# Patient Record
Sex: Female | Born: 1935
Health system: Southern US, Community
[De-identification: ages and names within clinical notes are randomized; demographics above are authoritative.]

## PROBLEM LIST (undated history)

## (undated) DIAGNOSIS — J189 Pneumonia, unspecified organism: Secondary | ICD-10-CM

## (undated) DIAGNOSIS — R52 Pain, unspecified: Secondary | ICD-10-CM

## (undated) DIAGNOSIS — F32A Depression, unspecified: Secondary | ICD-10-CM

## (undated) DIAGNOSIS — R112 Nausea with vomiting, unspecified: Secondary | ICD-10-CM

## (undated) DIAGNOSIS — K219 Gastro-esophageal reflux disease without esophagitis: Secondary | ICD-10-CM

## (undated) DIAGNOSIS — R51 Headache: Secondary | ICD-10-CM

## (undated) DIAGNOSIS — T4145XA Adverse effect of unspecified anesthetic, initial encounter: Secondary | ICD-10-CM

## (undated) DIAGNOSIS — I10 Essential (primary) hypertension: Secondary | ICD-10-CM

## (undated) DIAGNOSIS — R251 Tremor, unspecified: Secondary | ICD-10-CM

## (undated) DIAGNOSIS — H9190 Unspecified hearing loss, unspecified ear: Secondary | ICD-10-CM

## (undated) DIAGNOSIS — C8590 Non-Hodgkin lymphoma, unspecified, unspecified site: Secondary | ICD-10-CM

## (undated) DIAGNOSIS — R0600 Dyspnea, unspecified: Secondary | ICD-10-CM

## (undated) DIAGNOSIS — C50919 Malignant neoplasm of unspecified site of unspecified female breast: Secondary | ICD-10-CM

## (undated) DIAGNOSIS — T8859XA Other complications of anesthesia, initial encounter: Secondary | ICD-10-CM

## (undated) DIAGNOSIS — Z9889 Other specified postprocedural states: Secondary | ICD-10-CM

## (undated) DIAGNOSIS — F419 Anxiety disorder, unspecified: Secondary | ICD-10-CM

## (undated) DIAGNOSIS — F329 Major depressive disorder, single episode, unspecified: Secondary | ICD-10-CM

## (undated) HISTORY — PX: APPENDECTOMY: SHX54

## (undated) HISTORY — PX: BACK SURGERY: SHX140

## (undated) HISTORY — PX: BREAST SURGERY: SHX581

## (undated) HISTORY — PX: TUBAL LIGATION: SHX77

---

## 1990-10-12 HISTORY — PX: MASTECTOMY: SHX3

## 1998-02-25 ENCOUNTER — Other Ambulatory Visit: Admission: RE | Admit: 1998-02-25 | Discharge: 1998-02-25 | Payer: Self-pay | Admitting: Obstetrics and Gynecology

## 1999-03-21 ENCOUNTER — Other Ambulatory Visit: Admission: RE | Admit: 1999-03-21 | Discharge: 1999-03-21 | Payer: Self-pay | Admitting: Obstetrics and Gynecology

## 2000-03-18 ENCOUNTER — Encounter: Payer: Self-pay | Admitting: Obstetrics and Gynecology

## 2000-03-18 ENCOUNTER — Encounter: Admission: RE | Admit: 2000-03-18 | Discharge: 2000-03-18 | Payer: Self-pay | Admitting: Obstetrics and Gynecology

## 2000-03-23 ENCOUNTER — Other Ambulatory Visit: Admission: RE | Admit: 2000-03-23 | Discharge: 2000-03-23 | Payer: Self-pay | Admitting: Obstetrics and Gynecology

## 2001-03-21 ENCOUNTER — Encounter (HOSPITAL_BASED_OUTPATIENT_CLINIC_OR_DEPARTMENT_OTHER): Payer: Self-pay | Admitting: General Surgery

## 2001-03-21 ENCOUNTER — Encounter: Admission: RE | Admit: 2001-03-21 | Discharge: 2001-03-21 | Payer: Self-pay | Admitting: General Surgery

## 2001-03-30 ENCOUNTER — Other Ambulatory Visit: Admission: RE | Admit: 2001-03-30 | Discharge: 2001-03-30 | Payer: Self-pay | Admitting: Obstetrics and Gynecology

## 2002-03-22 ENCOUNTER — Encounter: Admission: RE | Admit: 2002-03-22 | Discharge: 2002-03-22 | Payer: Self-pay | Admitting: Obstetrics and Gynecology

## 2002-03-22 ENCOUNTER — Encounter: Payer: Self-pay | Admitting: Obstetrics and Gynecology

## 2002-03-28 ENCOUNTER — Other Ambulatory Visit: Admission: RE | Admit: 2002-03-28 | Discharge: 2002-03-28 | Payer: Self-pay | Admitting: Obstetrics and Gynecology

## 2003-04-17 ENCOUNTER — Encounter: Payer: Self-pay | Admitting: Obstetrics and Gynecology

## 2003-04-17 ENCOUNTER — Encounter: Admission: RE | Admit: 2003-04-17 | Discharge: 2003-04-17 | Payer: Self-pay | Admitting: Obstetrics and Gynecology

## 2004-04-25 ENCOUNTER — Encounter: Admission: RE | Admit: 2004-04-25 | Discharge: 2004-04-25 | Payer: Self-pay | Admitting: Obstetrics and Gynecology

## 2004-08-07 ENCOUNTER — Ambulatory Visit (HOSPITAL_COMMUNITY): Admission: RE | Admit: 2004-08-07 | Discharge: 2004-08-07 | Payer: Self-pay | Admitting: Gastroenterology

## 2004-12-26 ENCOUNTER — Emergency Department: Payer: Self-pay | Admitting: Unknown Physician Specialty

## 2005-01-01 ENCOUNTER — Ambulatory Visit: Payer: Self-pay

## 2005-01-05 ENCOUNTER — Inpatient Hospital Stay: Payer: Self-pay | Admitting: Unknown Physician Specialty

## 2005-05-05 ENCOUNTER — Encounter: Admission: RE | Admit: 2005-05-05 | Discharge: 2005-05-05 | Payer: Self-pay | Admitting: Obstetrics and Gynecology

## 2005-05-13 ENCOUNTER — Other Ambulatory Visit: Admission: RE | Admit: 2005-05-13 | Discharge: 2005-05-13 | Payer: Self-pay | Admitting: Obstetrics and Gynecology

## 2008-06-01 ENCOUNTER — Emergency Department: Payer: Self-pay | Admitting: Emergency Medicine

## 2008-06-03 ENCOUNTER — Emergency Department: Payer: Self-pay | Admitting: Emergency Medicine

## 2011-04-14 ENCOUNTER — Observation Stay: Payer: Self-pay | Admitting: Internal Medicine

## 2011-12-03 ENCOUNTER — Ambulatory Visit: Payer: Self-pay | Admitting: Family Medicine

## 2012-01-13 ENCOUNTER — Emergency Department: Payer: Self-pay | Admitting: *Deleted

## 2012-01-13 ENCOUNTER — Encounter (HOSPITAL_COMMUNITY): Payer: Self-pay | Admitting: *Deleted

## 2012-01-13 ENCOUNTER — Inpatient Hospital Stay (HOSPITAL_COMMUNITY)
Admission: AD | Admit: 2012-01-13 | Discharge: 2012-01-19 | DRG: 552 | Disposition: A | Discharge status: Skilled Nursing Facility | Payer: Medicare Other | Source: Other Acute Inpatient Hospital | Attending: Internal Medicine | Admitting: Internal Medicine

## 2012-01-13 DIAGNOSIS — S22009A Unspecified fracture of unspecified thoracic vertebra, initial encounter for closed fracture: Principal | ICD-10-CM | POA: Diagnosis present

## 2012-01-13 DIAGNOSIS — K5900 Constipation, unspecified: Secondary | ICD-10-CM | POA: Diagnosis present

## 2012-01-13 DIAGNOSIS — Z888 Allergy status to other drugs, medicaments and biological substances status: Secondary | ICD-10-CM

## 2012-01-13 DIAGNOSIS — Z853 Personal history of malignant neoplasm of breast: Secondary | ICD-10-CM

## 2012-01-13 DIAGNOSIS — M545 Low back pain, unspecified: Secondary | ICD-10-CM | POA: Diagnosis present

## 2012-01-13 DIAGNOSIS — F3289 Other specified depressive episodes: Secondary | ICD-10-CM | POA: Diagnosis present

## 2012-01-13 DIAGNOSIS — F329 Major depressive disorder, single episode, unspecified: Secondary | ICD-10-CM | POA: Diagnosis present

## 2012-01-13 DIAGNOSIS — W19XXXA Unspecified fall, initial encounter: Secondary | ICD-10-CM | POA: Diagnosis present

## 2012-01-13 DIAGNOSIS — E785 Hyperlipidemia, unspecified: Secondary | ICD-10-CM | POA: Diagnosis present

## 2012-01-13 DIAGNOSIS — S22080A Wedge compression fracture of T11-T12 vertebra, initial encounter for closed fracture: Secondary | ICD-10-CM | POA: Diagnosis present

## 2012-01-13 DIAGNOSIS — F411 Generalized anxiety disorder: Secondary | ICD-10-CM | POA: Diagnosis present

## 2012-01-13 DIAGNOSIS — Z79899 Other long term (current) drug therapy: Secondary | ICD-10-CM

## 2012-01-13 DIAGNOSIS — I10 Essential (primary) hypertension: Secondary | ICD-10-CM | POA: Diagnosis present

## 2012-01-13 DIAGNOSIS — K219 Gastro-esophageal reflux disease without esophagitis: Secondary | ICD-10-CM | POA: Diagnosis present

## 2012-01-13 HISTORY — DX: Anxiety disorder, unspecified: F41.9

## 2012-01-13 HISTORY — DX: Pneumonia, unspecified organism: J18.9

## 2012-01-13 HISTORY — DX: Depression, unspecified: F32.A

## 2012-01-13 HISTORY — DX: Headache: R51

## 2012-01-13 HISTORY — DX: Major depressive disorder, single episode, unspecified: F32.9

## 2012-01-13 HISTORY — DX: Gastro-esophageal reflux disease without esophagitis: K21.9

## 2012-01-13 HISTORY — DX: Essential (primary) hypertension: I10

## 2012-01-13 LAB — COMPREHENSIVE METABOLIC PANEL
Albumin: 3.6 g/dL (ref 3.5–5.2)
Alkaline Phosphatase: 77 U/L (ref 39–117)
BUN: 19 mg/dL (ref 6–23)
Calcium: 9 mg/dL (ref 8.4–10.5)
GFR calc Af Amer: >90 mL/min (ref >90)
Potassium: 4.1 mEq/L (ref 3.5–5.1)
Sodium: 138 mEq/L (ref 135–145)
Total Protein: 6.2 g/dL (ref 6.0–8.3)

## 2012-01-13 LAB — PROTIME-INR
INR: 1.09 (ref 0.00–1.49)
Prothrombin Time: 14.3 seconds (ref 11.6–15.2)

## 2012-01-13 MED ORDER — HYDROCODONE-ACETAMINOPHEN 5-325 MG PO TABS
1.0000 | ORAL_TABLET | Freq: Four times a day (QID) | ORAL | Status: DC | PRN
  Administered 2012-01-14 – 2012-01-16 (×7): 1 via ORAL
  Filled 2012-01-13 (×7): qty 1

## 2012-01-13 MED ORDER — ALPRAZOLAM 0.25 MG PO TABS
0.2500 mg | ORAL_TABLET | Freq: Three times a day (TID) | ORAL | Status: DC | PRN
  Administered 2012-01-14 – 2012-01-19 (×5): 0.25 mg via ORAL
  Filled 2012-01-13 (×5): qty 1

## 2012-01-13 MED ORDER — HYDROCODONE-ACETAMINOPHEN 5-500 MG PO TABS: 1.0000 | ORAL_TABLET | Freq: Four times a day (QID) | ORAL | Status: DC | PRN

## 2012-01-13 MED ORDER — BISOPROLOL FUMARATE 5 MG PO TABS
2.5000 mg | ORAL_TABLET | Freq: Every day | ORAL | Status: DC
  Administered 2012-01-14 – 2012-01-19 (×6): 2.5 mg via ORAL
  Filled 2012-01-13 (×6): qty 0.5

## 2012-01-13 MED ORDER — SODIUM CHLORIDE 0.9 % IV SOLN
INTRAVENOUS | Status: DC
  Administered 2012-01-13 – 2012-01-16 (×3): via INTRAVENOUS

## 2012-01-13 MED ORDER — FAMOTIDINE 20 MG PO TABS
20.0000 mg | ORAL_TABLET | Freq: Every day | ORAL | Status: DC
  Administered 2012-01-14 – 2012-01-18 (×5): 20 mg via ORAL
  Filled 2012-01-13 (×6): qty 1

## 2012-01-13 MED ORDER — HYDROMORPHONE HCL PF 1 MG/ML IJ SOLN
0.5000 mg | INTRAMUSCULAR | Status: DC | PRN
  Administered 2012-01-13 – 2012-01-14 (×2): 0.5 mg via INTRAVENOUS
  Administered 2012-01-14: 08:00:00 via INTRAVENOUS
  Administered 2012-01-14: 0.5 mg via INTRAVENOUS
  Filled 2012-01-13 (×4): qty 1

## 2012-01-13 MED ORDER — MECLIZINE HCL 25 MG PO TABS
25.0000 mg | ORAL_TABLET | Freq: Three times a day (TID) | ORAL | Status: DC | PRN
  Filled 2012-01-13: qty 1

## 2012-01-13 MED ORDER — ACETAMINOPHEN 650 MG RE SUPP: 650.0000 mg | Freq: Four times a day (QID) | RECTAL | Status: DC | PRN

## 2012-01-13 MED ORDER — ONDANSETRON HCL 4 MG/2ML IJ SOLN: 4.0000 mg | Freq: Four times a day (QID) | INTRAMUSCULAR | Status: DC | PRN

## 2012-01-13 MED ORDER — ACETAMINOPHEN 325 MG PO TABS
650.0000 mg | ORAL_TABLET | Freq: Four times a day (QID) | ORAL | Status: DC | PRN
  Administered 2012-01-15 – 2012-01-19 (×6): 650 mg via ORAL
  Filled 2012-01-13 (×7): qty 2

## 2012-01-13 MED ORDER — ONDANSETRON HCL 4 MG PO TABS: 4.0000 mg | ORAL_TABLET | Freq: Four times a day (QID) | ORAL | Status: DC | PRN

## 2012-01-13 MED ORDER — SERTRALINE HCL 100 MG PO TABS
100.0000 mg | ORAL_TABLET | Freq: Every day | ORAL | Status: DC
  Administered 2012-01-14 – 2012-01-19 (×6): 100 mg via ORAL
  Filled 2012-01-13 (×6): qty 1

## 2012-01-13 MED ORDER — TRAZODONE HCL 50 MG PO TABS
50.0000 mg | ORAL_TABLET | Freq: Every day | ORAL | Status: DC
  Administered 2012-01-13 – 2012-01-18 (×6): 50 mg via ORAL
  Filled 2012-01-13 (×7): qty 1

## 2012-01-13 MED ORDER — SIMVASTATIN 40 MG PO TABS
40.0000 mg | ORAL_TABLET | Freq: Every day | ORAL | Status: DC
  Administered 2012-01-14 – 2012-01-18 (×5): 40 mg via ORAL
  Filled 2012-01-13 (×6): qty 1

## 2012-01-13 NOTE — H&P (Addendum)
Lindsey Barnes is an 78 y.o. female.   PCP - Dr.Bronstein.In [place]. Ortho - Dr.Califf.In [employer]. Chief Complaint: Fall with low back pain. HPI: 78 year-old female with history of hypertension, hyperlipidemia, cancer breast status post right-sided mastectomy 20 years ago, history of lumbar fracture status post kyphoplasty had a fall at her home while trying to [facility]. She is and fell on her back but did not lose consciousness and did not hit her head. Patient came to the ER at [facility] and CAT scan of the spine done showed T12 compression fracture. Patient usually follows with Dr. Gerrit Heck, orthopedic surgeon, who is out of town so for further management patient has been transferred to [facility]. Patient denies any incontinence of urine or bowel. Patient's pain is controlled with Dilaudid. Patient denies any chest pain, shortness of breath, any weakness or lower extremity, nausea vomiting. Patient's pain is on the mid back radiating to her front and also complaining of mild epigastric tenderness.  Past Medical History  Diagnosis Date  . Hypertension   . Pneumonia   . GERD (gastroesophageal reflux disease)   . Headache   . Cancer   . Anxiety   . Depression     Past Surgical History  Procedure Date  . Mastectomy   . Breast surgery   . Appendectomy   . Back surgery   . Tubal ligation     History reviewed. No pertinent family history. Social History:  reports that she has never smoked. She has never used smokeless tobacco. She reports that she uses illicit drugs (Hydrocodone and Benzodiazepines). She reports that she does not drink alcohol.  Allergies:  Allergies  Allergen Reactions  . Amoxicillin Swelling    Blisters.   . Aspirin Other (See Comments)    Stomach ulcers.  . Benadryl (Altaryl) Other (See Comments)    Hyperactive.    Medications Prior to Admission  Medication Dose Route Frequency Provider Last Rate Last Dose  . 0.9 %  sodium  chloride infusion   Intravenous Continuous Eduard Clos, MD      . acetaminophen (TYLENOL) tablet 650 mg  650 mg Oral Q6H PRN Eduard Clos, MD       Or  . acetaminophen (TYLENOL) suppository 650 mg  650 mg Rectal Q6H PRN Eduard Clos, MD      . ALPRAZolam Prudy Feeler) tablet 0.25 mg  0.25 mg Oral TID PRN Eduard Clos, MD      . bisoprolol (ZEBETA) tablet 2.5 mg  2.5 mg Oral Daily Eduard Clos, MD      . famotidine (PEPCID) tablet 20 mg  20 mg Oral Daily Eduard Clos, MD      . HYDROcodone-acetaminophen (VICODIN) 5-500 MG per tablet 1 tablet  1 tablet Oral Q6H PRN Eduard Clos, MD      . HYDROmorphone (DILAUDID) injection 0.5 mg  0.5 mg Intravenous Q4H PRN Eduard Clos, MD      . meclizine (ANTIVERT) tablet 25 mg  25 mg Oral TID PRN Eduard Clos, MD      . ondansetron [facility]) tablet 4 mg  4 mg Oral Q6H PRN Eduard Clos, MD       Or  . ondansetron [facility]) injection 4 mg  4 mg Intravenous Q6H PRN Eduard Clos, MD      . sertraline (ZOLOFT) tablet 100 mg  100 mg Oral Daily Eduard Clos, MD      . simvastatin (ZOCOR) tablet  40 mg  40 mg Oral QPM Eduard Clos, MD      . traZODone (DESYREL) tablet 50 mg  50 mg Oral QHS Eduard Clos, MD       Medications Prior to Admission  Medication Sig Dispense Refill  . bisoprolol-hydrochlorothiazide (ZIAC) 2.5-6.25 MG per tablet Take 1 tablet by mouth daily.      . ranitidine (ZANTAC) 150 MG tablet Take 150 mg by mouth 2 (two) times daily.      . sertraline (ZOLOFT) 100 MG tablet Take 100 mg by mouth daily.      . simvastatin (ZOCOR) 40 MG tablet Take 40 mg by mouth every evening.      . traZODone (DESYREL) 50 MG tablet Take 50 mg by mouth at bedtime.        No results found for this or any previous visit (from the past 48 hour(s)). No results found.  Review of Systems  Constitutional: Negative.   HENT: Negative.   Eyes: Negative.   Respiratory: Negative.     Cardiovascular: Negative.   Gastrointestinal: Negative.   Genitourinary: Negative.   Musculoskeletal: Positive for back pain and falls.  Skin: Negative.   Neurological: Negative.   Endo/Heme/Allergies: Negative.   Psychiatric/Behavioral: Negative.     Blood pressure 140/71, pulse 20, temperature 97.6 F (36.4 C), temperature source Oral, resp. rate 20, weight 76.8 kg (169 lb 5 oz), SpO2 94.00%. Physical Exam  Constitutional: She is oriented to person, place, and time. She appears well-developed and well-nourished. No distress.  HENT:  Head: Normocephalic and atraumatic.  Right Ear: External ear normal.  Left Ear: External ear normal.  Mouth/Throat: Oropharynx is clear and moist. No oropharyngeal exudate.  Eyes: Conjunctivae are normal. Pupils are equal, round, and reactive to light. Right eye exhibits no discharge. Left eye exhibits no discharge. No scleral icterus.  Neck: Normal range of motion. Neck supple.  Cardiovascular: Normal rate and regular rhythm.   Respiratory: Effort normal and breath sounds normal. No respiratory distress. She has no wheezes. She has no rales.  GI: Soft. Bowel sounds are normal. She exhibits no distension. There is no tenderness. There is no rebound.  Musculoskeletal: Normal range of motion. She exhibits no edema and no tenderness.  Neurological: She is alert and oriented to person, place, and time.       Moves all extremities.  Skin: Skin is warm and dry. No rash noted. She is not diaphoretic. No erythema.  Psychiatric: Her behavior is normal.     Assessment/Plan #1. Acute T12 compression fracture status post mechanical fall - I have consulted Dr. Lajoyce Corners on-call orthopedic surgeon who will be seeing patient in consult. Patient will be placed on pain relief medications. We'll follow orthopedic recommendations. #2. History of previous lumbar fracture status post kyphoplasty. #3. History of hypertension and hyperlipidemia and history of cancer breast  status post mastectomy.  CODE STATUS - full code.  Eduard Clos 01/13/2012, 10:01 PM   Addendum - I don't have the access to the patient's labs done at [facility]. Most of the information I discerned from patient and the transfer chart.  Midge Minium

## 2012-01-14 LAB — CBC
Hemoglobin: 11.9 g/dL — ABNORMAL LOW (ref 12.0–15.0)
MCH: 30 pg (ref 26.0–34.0)
Platelets: 132 10*3/uL — ABNORMAL LOW (ref 150–400)
RBC: 3.97 MIL/uL (ref 3.87–5.11)
WBC: 9.3 10*3/uL (ref 4.0–10.5)

## 2012-01-14 MED ORDER — POLYVINYL ALCOHOL 1.4 % OP SOLN
1.0000 [drp] | OPHTHALMIC | Status: DC | PRN
  Administered 2012-01-14: 1 [drp] via OPHTHALMIC
  Filled 2012-01-14: qty 15

## 2012-01-14 MED ORDER — ALUM & MAG HYDROXIDE-SIMETH 200-200-20 MG/5ML PO SUSP
30.0000 mL | Freq: Four times a day (QID) | ORAL | Status: DC | PRN
  Administered 2012-01-14: 30 mL via ORAL
  Filled 2012-01-14: qty 30

## 2012-01-14 NOTE — Progress Notes (Signed)
Subjective: Patient seen and examined, complaining of severe back pain with movement. She denies any leg weakness or numbness  Objective: Vital signs in last 24 hours: Temp:  [97.6 F (36.4 C)-98.1 F (36.7 C)] 98 F (36.7 C) (04/04 0604) Pulse Rate:  [79-88] 79  (04/04 0604) Resp:  [20] 20  (04/04 0604) BP: (119-140)/(66-71) 122/68 mmHg (04/04 0604) SpO2:  [93 %-94 %] 94 % (04/04 0604) Weight:  [76.8 kg (169 lb 5 oz)] 76.8 kg (169 lb 5 oz) (04/03 2030) Weight change:  Last BM Date: 01/13/12  Intake/Output from previous day:   Total I/O In: 240 [P.O.:240] Out: -    Physical Exam: General: Alert, awake, oriented x3, in moderate acute distress. Heart: Regular rate and rhythm, without murmurs, rubs, gallops. Lungs: Clear to auscultation bilaterally. Abdomen: Soft, nontender, nondistended, positive bowel sounds. Extremities: No clubbing cyanosis or edema with positive pedal pulses. Neuro: Grossly intact, nonfocal.    Lab Results: Results for orders placed during the hospital encounter of 01/13/12 (from the past 24 hour(s))  COMPREHENSIVE METABOLIC PANEL     Status: Abnormal   Collection Time   01/13/12 10:27 PM      Component Value Range   Sodium 138  135 - 145 (mEq/L)   Potassium 4.1  3.5 - 5.1 (mEq/L)   Chloride 101  96 - 112 (mEq/L)   CO2 27  19 - 32 (mEq/L)   Glucose, Bld 140 (*) 70 - 99 (mg/dL)   BUN 19  6 - 23 (mg/dL)   Creatinine, Ser 4.54  0.50 - 1.10 (mg/dL)   Calcium 9.0  8.4 - 09.8 (mg/dL)   Total Protein 6.2  6.0 - 8.3 (g/dL)   Albumin 3.6  3.5 - 5.2 (g/dL)   AST 19  0 - 37 (U/L)   ALT 14  0 - 35 (U/L)   Alkaline Phosphatase 77  39 - 117 (U/L)   Total Bilirubin 0.5  0.3 - 1.2 (mg/dL)   GFR calc non Af Amer 86 (*) >90 (mL/min)   GFR calc Af Amer >90  >90 (mL/min)  PROTIME-INR     Status: Normal   Collection Time   01/13/12 10:27 PM      Component Value Range   Prothrombin Time 14.3  11.6 - 15.2 (seconds)   INR 1.09  0.00 - 1.49   CBC     Status:  Abnormal   Collection Time   01/14/12  9:45 AM      Component Value Range   WBC 9.3  4.0 - 10.5 (K/uL)   RBC 3.97  3.87 - 5.11 (MIL/uL)   Hemoglobin 11.9 (*) 12.0 - 15.0 (g/dL)   HCT 11.9  14.7 - 82.9 (%)   MCV 92.2  78.0 - 100.0 (fL)   MCH 30.0  26.0 - 34.0 (pg)   MCHC 32.5  30.0 - 36.0 (g/dL)   RDW 56.2  13.0 - 86.5 (%)   Platelets 132 (*) 150 - 400 (K/uL)    Studies/Results: No results found.  Medications:    . bisoprolol  2.5 mg Oral Daily  . famotidine  20 mg Oral QHS  . sertraline  100 mg Oral Daily  . simvastatin  40 mg Oral q1800  . traZODone  50 mg Oral QHS    acetaminophen, acetaminophen, ALPRAZolam, HYDROcodone-acetaminophen, HYDROmorphone, meclizine, ondansetron (ZOFRAN) IV, ondansetron, polyvinyl alcohol, DISCONTD: HYDROcodone-acetaminophen     . sodium chloride 50 mL/hr at 01/13/12 2229    Assessment/Plan:  Principal Problem:  *Compression fracture of  T12 vertebra Appreciate Dr. Audrie Lia  Input. Recommendation for pain control, brace and PT noted and will be followed. Daughter is requesting neurosurgical consultation as a second opinion since patient's primary orthopedics in [place] [facility]  Recommended surgery as per her but he is out of town. Daughter requesting consultant Dr. Dutch Quint (on call tomorrow), we'll consult in the a.m. Active Problems:  HTN (hypertension): Controlled, continue bisoprolol  Hyperlipidemia: Continue statins DVT prophylaxis: SCDs    LOS: 1 day   Lindsey Barnes 01/14/2012, 2:12 PM

## 2012-01-14 NOTE — Progress Notes (Signed)
Utilization Review Completed.Lindsey Barnes T4/01/2012   

## 2012-01-14 NOTE — Consult Note (Signed)
Reason for Consult:acute T-12 comp fx Referring Physician: Kennya Schwenn is an 78 y.o. female.  HPI: fell on her buttocks while mowing yard  Past Medical History  Diagnosis Date  . Hypertension   . Pneumonia   . GERD (gastroesophageal reflux disease)   . Headache   . Cancer   . Anxiety   . Depression     Past Surgical History  Procedure Date  . Mastectomy   . Breast surgery   . Appendectomy   . Back surgery   . Tubal ligation     History reviewed. No pertinent family history.  Social History:  reports that she has never smoked. She has never used smokeless tobacco. She reports that she uses illicit drugs (Hydrocodone and Benzodiazepines). She reports that she does not drink alcohol.  Allergies:  Allergies  Allergen Reactions  . Amoxicillin Swelling    Blisters.   . Aspirin Other (See Comments)    Stomach ulcers.  . Benadryl (Altaryl) Other (See Comments)    Hyperactive.    Medications: I have reviewed the patient's current medications.  Results for orders placed during the hospital encounter of 01/13/12 (from the past 48 hour(s))  COMPREHENSIVE METABOLIC PANEL     Status: Abnormal   Collection Time   01/13/12 10:27 PM      Component Value Range Comment   Sodium 138  135 - 145 (mEq/L)    Potassium 4.1  3.5 - 5.1 (mEq/L)    Chloride 101  96 - 112 (mEq/L)    CO2 27  19 - 32 (mEq/L)    Glucose, Bld 140 (*) 70 - 99 (mg/dL)    BUN 19  6 - 23 (mg/dL)    Creatinine, Ser 6.04  0.50 - 1.10 (mg/dL)    Calcium 9.0  8.4 - 10.5 (mg/dL)    Total Protein 6.2  6.0 - 8.3 (g/dL)    Albumin 3.6  3.5 - 5.2 (g/dL)    AST 19  0 - 37 (U/L)    ALT 14  0 - 35 (U/L)    Alkaline Phosphatase 77  39 - 117 (U/L)    Total Bilirubin 0.5  0.3 - 1.2 (mg/dL)    GFR calc non Af Amer 86 (*) >90 (mL/min)    GFR calc Af Amer >90  >90 (mL/min)   PROTIME-INR     Status: Normal   Collection Time   01/13/12 10:27 PM      Component Value Range Comment   Prothrombin Time 14.3  11.6 -  15.2 (seconds)    INR 1.09  0.00 - 1.49      No results found.  Review of Systems  All other systems reviewed and are negative.   Blood pressure 122/68, pulse 79, temperature 98 F (36.7 C), temperature source Oral, resp. rate 20, height 5\' 7"  (1.702 m), weight 76.8 kg (169 lb 5 oz), SpO2 94.00%. Physical ExamNVI bilateral lower extremities, tender to palpation over T -12 spine area.  CT reviewed shows old kyphoplasty of L-2, with T-12 comp fx without retropulsion, compression about 10%. Assessment/Plan: 10% compression fx T-12 Recommend corset brace, PT prog ambulation, she may need ST SNF placement.  I'll f/u PRN, do not recommend surgery or kyphoplasty  Oceania Noori V 01/14/2012, 7:14 AM

## 2012-01-14 NOTE — Progress Notes (Signed)
PT Cancellation note   Evaluation cancelled today due to medical issues with patient which prohibited therapy. Pt awaiting neuro consult.  Please update orders when patient appropriate for PT.    Crawford Tamura SPT

## 2012-01-14 NOTE — Progress Notes (Deleted)
Utilization Review Completed.Hanley Woerner T4/01/2012   

## 2012-01-14 NOTE — Progress Notes (Signed)
Agree with student PT cancellation note.  Alaura Schippers, PT DPT [phone]  

## 2012-01-15 ENCOUNTER — Inpatient Hospital Stay (HOSPITAL_COMMUNITY): Payer: Medicare Other

## 2012-01-15 MED ORDER — POLYETHYLENE GLYCOL 3350 17 G PO PACK
17.0000 g | PACK | Freq: Every day | ORAL | Status: DC
  Administered 2012-01-15 – 2012-01-19 (×5): 17 g via ORAL
  Filled 2012-01-15 (×5): qty 1

## 2012-01-15 MED ORDER — SENNOSIDES-DOCUSATE SODIUM 8.6-50 MG PO TABS
2.0000 | ORAL_TABLET | Freq: Every day | ORAL | Status: DC
  Administered 2012-01-15 – 2012-01-17 (×3): 2 via ORAL
  Filled 2012-01-15 (×3): qty 2

## 2012-01-15 NOTE — Progress Notes (Signed)
Clinical Social Work Department BRIEF PSYCHOSOCIAL ASSESSMENT 01/15/2012  Patient:  NASHAY, BRICKLEY     Account Number:  [id number]     Admit date:  01/13/2012  Clinical Social Worker:  Dennison Bulla  Date/Time:  01/15/2012 04:45 PM  Referred by:  Physician  Date Referred:  01/15/2012 Referred for  SNF Placement   Other Referral:   Interview type:  Patient Other interview type:   Dtr-Tammy present    PSYCHOSOCIAL DATA Living Status:  ALONE Admitted from facility:   Level of care:   Primary support name:  Tammy Primary support relationship to patient:  CHILD, ADULT Degree of support available:   Adequate    CURRENT CONCERNS Current Concerns  Post-Acute Placement   Other Concerns:    SOCIAL WORK ASSESSMENT / PLAN CSW received referral due to PT recommending SNF. CSW met with patient and dtr to discuss SNF process and give SNF list. Patient has done outpt PT at [facility] and would like to return. CSW explained process and CSW role. CSW completed FL2 and faxed out to [employer]. CSW will continue to follow.   Assessment/plan status:  Psychosocial Support/Ongoing Assessment of Needs Other assessment/ plan:   Information/referral to community resources:   SNF list    PATIENT'S/FAMILY'S RESPONSE TO PLAN OF CARE: Patient was alert and oriented. Patient was very independent before hospitalization. Pt and dtr receptive to SNF search.

## 2012-01-15 NOTE — Progress Notes (Signed)
Orthopedic Tech Progress Note Patient Details:  Lindsey Barnes 1933/01/07 [id number]  Patient ID: [name], female   DOB: [birth year], 78 y.o.   MRN: [id number] Order completed by Storm Frisk, Parris Cudworth 01/15/2012, 5:35 PM

## 2012-01-15 NOTE — Consult Note (Signed)
Reason for Consult: T12 fracture Referring Physician: [name] is an 78 y.o. female.  HPI: 78 year old female status post slip and fall from standing height with a resultant back pain. Pain is upper lumbar in nature without radiation. No symptoms of numbness paresthesias or weakness. Patient has a prior history of a previous L2 compression fracture treated with a percutaneous vertebroplasty. Patient was transferred from [facility] for evaluation.  Past Medical History  Diagnosis Date  . Hypertension   . Pneumonia   . GERD (gastroesophageal reflux disease)   . Headache   . Cancer   . Anxiety   . Depression     Past Surgical History  Procedure Date  . Mastectomy   . Breast surgery   . Appendectomy   . Back surgery   . Tubal ligation     History reviewed. No pertinent family history.  Social History:  reports that she has never smoked. She has never used smokeless tobacco. She reports that she uses illicit drugs (Hydrocodone and Benzodiazepines). She reports that she does not drink alcohol.  Allergies:  Allergies  Allergen Reactions  . Amoxicillin Swelling    Blisters.   . Aspirin Other (See Comments)    Stomach ulcers.  . Benadryl (Altaryl) Other (See Comments)    Hyperactive.    Medications: I have reviewed the patient's current medications.  Results for orders placed during the hospital encounter of 01/13/12 (from the past 48 hour(s))  COMPREHENSIVE METABOLIC PANEL     Status: Abnormal   Collection Time   01/13/12 10:27 PM      Component Value Range Comment   Sodium 138  135 - 145 (mEq/L)    Potassium 4.1  3.5 - 5.1 (mEq/L)    Chloride 101  96 - 112 (mEq/L)    CO2 27  19 - 32 (mEq/L)    Glucose, Bld 140 (*) 70 - 99 (mg/dL)    BUN 19  6 - 23 (mg/dL)    Creatinine, Ser 4.09  0.50 - 1.10 (mg/dL)    Calcium 9.0  8.4 - 10.5 (mg/dL)    Total Protein 6.2  6.0 - 8.3 (g/dL)    Albumin 3.6  3.5 - 5.2 (g/dL)    AST 19  0 - 37 (U/L)    ALT 14   0 - 35 (U/L)    Alkaline Phosphatase 77  39 - 117 (U/L)    Total Bilirubin 0.5  0.3 - 1.2 (mg/dL)    GFR calc non Af Amer 86 (*) >90 (mL/min)    GFR calc Af Amer >90  >90 (mL/min)   PROTIME-INR     Status: Normal   Collection Time   01/13/12 10:27 PM      Component Value Range Comment   Prothrombin Time 14.3  11.6 - 15.2 (seconds)    INR 1.09  0.00 - 1.49    CBC     Status: Abnormal   Collection Time   01/14/12  9:45 AM      Component Value Range Comment   WBC 9.3  4.0 - 10.5 (K/uL)    RBC 3.97  3.87 - 5.11 (MIL/uL)    Hemoglobin 11.9 (*) 12.0 - 15.0 (g/dL)    HCT 81.1  91.4 - 78.2 (%)    MCV 92.2  78.0 - 100.0 (fL)    MCH 30.0  26.0 - 34.0 (pg)    MCHC 32.5  30.0 - 36.0 (g/dL)    RDW 95.6  21.3 - 08.6 (%)  Platelets 132 (*) 150 - 400 (K/uL)     No results found.  Pertinent items are noted in HPI. Blood pressure 133/52, pulse 76, temperature 98.3 F (36.8 C), temperature source Oral, resp. rate 18, height 5\' 7"  (1.702 m), weight 76.8 kg (169 lb 5 oz), SpO2 93.00%. Patient is awake and alert she is oriented and appropriate. She is obviously uncomfortable. Examination of her cervical spine with full active range of motion. Examination of thoracic spine reveals some moderate lower thoracic tenderness with some spasm. There is no obvious bony abnormality. Straight leg raising is negative bilaterally. Motor and sensory function extremities is normal. Chest and abdomen are benign.   Imaging: Patient has plain x-rays and CT scan of her thoracic and lumbar spine. These demonstrated minimal compression fracture without any retropulsion of the T12 vertebral body. Pedicles and posterior elements intact. Previous L2 vertebroplasty appears stable. No other obvious injury.  Assessment/Plan: Minimal T12 compression fracture with resultant back pain. No indication for operative intervention. I agree with Dr.Duda's assessment and plan. I will order a TLSO brace. Patient gave may be mobilized with  physical and occupational therapy have ad lib. with the brace on.. Patient may don and doff the brace while sitting upright.  [name] A 01/15/2012, 4:05 PM

## 2012-01-15 NOTE — Evaluation (Signed)
Physical Therapy Evaluation Patient Details Name: Lindsey Barnes MRN: [id number] DOB: 08-Nov-1932 Today's Date: 01/15/2012  Problem List:  Patient Active Problem List  Diagnoses  . Compression fracture of T12 vertebra  . HTN (hypertension)  . Hyperlipidemia  . Low back pain    Past Medical History:  Past Medical History  Diagnosis Date  . Hypertension   . Pneumonia   . GERD (gastroesophageal reflux disease)   . Headache   . Cancer   . Anxiety   . Depression    Past Surgical History:  Past Surgical History  Procedure Date  . Mastectomy   . Breast surgery   . Appendectomy   . Back surgery   . Tubal ligation     PT Assessment/Plan/Recommendation PT Assessment Clinical Impression Statement: Pt is a 78 y.o. female s/p a fall which resulted in a T12 compression fracture.  Pt has resulting pain, decreased mobility, impaired balance, and impaired gait.  Pt will benefit from skilled physical therapy in the acute care setting to address these impairments and return to her previous level of function, PT Recommendation/Assessment: Patient will need skilled PT in the acute care venue PT Problem List: Decreased activity tolerance;Decreased balance;Decreased mobility;Decreased coordination;Decreased knowledge of use of DME;Decreased knowledge of precautions;Pain Barriers to Discharge: Decreased caregiver support PT Therapy Diagnosis : Difficulty walking;Abnormality of gait;Acute pain PT Plan PT Frequency: Min 5X/week PT Treatment/Interventions: DME instruction;Gait training;Stair training;Functional mobility training;Therapeutic activities;Balance training;Neuromuscular re-education;Patient/family education PT Recommendation Follow Up Recommendations: SNF Equipment Recommended: Rolling walker with 5" wheels PT Goals  Acute Rehab PT Goals PT Goal Formulation: With patient Time For Goal Achievement: 7 days Pt will Roll Supine to Left Side: with modified independence;with rail PT  Goal: Rolling Supine to Left Side - Progress: Goal set today Pt will go Supine/Side to Sit: with modified independence;with rail PT Goal: Supine/Side to Sit - Progress: Goal set today Pt will go Sit to Stand: with modified independence;with upper extremity assist PT Goal: Sit to Stand - Progress: Goal set today Pt will go Stand to Sit: with modified independence;with upper extremity assist PT Goal: Stand to Sit - Progress: Goal set today Pt will Ambulate: 51 - 150 feet;with modified independence;with least restrictive assistive device PT Goal: Ambulate - Progress: Goal set today Pt will Go Up / Down Stairs: 3-5 stairs;with supervision;with rail(s);with least restrictive assistive device PT Goal: Up/Down Stairs - Progress: Goal set today Additional Goals Additional Goal #1: Pt will be able to describe and demonstrate 3/3 back precautions. PT Goal: Additional Goal #1 - Progress: Goal set today  PT Evaluation Precautions/Restrictions  Precautions Precautions: Back Precaution Booklet Issued: No Precaution Comments: Reviewed 3/3 back precautions. Required Braces or Orthoses: Yes Spinal Brace: Lumbar corset Restrictions Weight Bearing Restrictions: No Prior Functioning  Home Living Lives With: Alone Receives Help From: Family Type of Home: House Home Layout: One level Home Access: Stairs to enter Entrance Stairs-Rails: Left Entrance Stairs-Number of Steps: 2 Bathroom Shower/Tub: Walk-in shower;Door Bathroom Toilet: Handicapped height Bathroom Accessibility: No Home Adaptive Equipment: Grab bars around toilet;Grab bars in shower;Built-in shower seat Prior Function Level of Independence: Independent with basic ADLs;Independent with homemaking with ambulation;Independent with gait;Independent with transfers Able to Take Stairs?: Yes Driving: Yes Vocation: Retired Leisure: Hobbies-yes (Comment) (Gardening, reading, quilting.) Cognition Cognition Arousal/Alertness:  Awake/alert Overall Cognitive Status: Appears within functional limits for tasks assessed Orientation Level: Oriented X4 Sensation/Coordination Sensation Light Touch: Appears Intact Coordination Gross Motor Movements are Fluid and Coordinated: Yes Extremity Assessment   Mobility (including Balance)  Bed Mobility Bed Mobility: Yes Rolling Right: Other (comment);With rail (Min guard) Rolling Right Details (indicate cue type and reason): Pt required min guard for safety with VC for sequencing to follow back precautions. Rolling Left: Other (comment);With rail (Min guard) Rolling Left Details (indicate cue type and reason): Pt required min guard for safety and VC for sequencing to follow back precautions. Left Sidelying to Sit: 4: Min assist;With rails Left Sidelying to Sit Details (indicate cue type and reason): Pt required min assist to initiate transfer. Sitting - Scoot to Edge of Bed: Other (comment) (Min guard) Sitting - Scoot to Edge of Bed Details (indicate cue type and reason): Pt required min guard for safety. Sit to Sidelying Left: Other (comment);With rail (Min guard) Sit to Sidelying Left Details (indicate cue type and reason): Pt required min guard to safety lower onto side. Transfers Transfers: Yes Sit to Stand: 3: Mod assist;From bed;With upper extremity assist;From chair/3-in-1;With armrests Sit to Stand Details (indicate cue type and reason): Pt required mod assist to complete transfer with VC for hand placement with RW. Stand to Sit: 4: Min assist;To bed;With upper extremity assist;To chair/3-in-1;With armrests Stand to Sit Details: Pt required min assist to control descent into chair and VC for hand placement with RW. Ambulation/Gait Ambulation/Gait: Yes Ambulation/Gait Assistance: 4: Min assist Ambulation/Gait Assistance Details (indicate cue type and reason): Pt required min assist for safety due to impaired balance. Ambulation Distance (Feet): 20 Feet Assistive  device: Rolling walker Gait Pattern: Step-to pattern;Decreased stride length Gait velocity: Decreased Stairs: No Wheelchair Mobility Wheelchair Mobility: No  Posture/Postural Control Posture/Postural Control: No significant limitations Balance Balance Assessed: No Exercise    End of Session PT - End of Session Equipment Utilized During Treatment: Gait belt;Back brace Activity Tolerance: Patient limited by pain Patient left: in bed;with call bell in reach;with family/visitor present Nurse Communication: Mobility status for transfers;Mobility status for ambulation General Behavior During Session: [facility] for tasks performed Cognition: [facility] for tasks performed  Ezzard Standing SPT 01/15/2012, 3:34 PM  Jake Shark, PT DPT [phone]

## 2012-01-15 NOTE — Progress Notes (Addendum)
Subjective: Patient seen and examined, reported some improvement in her back pain but complaining of feeling bloated and constipated since Wednesday. Denies any nausea or vomiting, tolerating food very well.  Objective: Vital signs in last 24 hours: Temp:  [98.3 F (36.8 C)-99.5 F (37.5 C)] 98.3 F (36.8 C) (04/05 1350) Pulse Rate:  [71-78] 76  (04/05 1350) Resp:  [18] 18  (04/05 1350) BP: (125-133)/(52-86) 133/52 mmHg (04/05 1350) SpO2:  [92 %-94 %] 93 % (04/05 1350) Weight change:  Last BM Date: 01/13/12  Intake/Output from previous day: 04/04 0701 - 04/05 0700 In: 1230 [P.O.:630; I.V.:600] Out: 600 [Urine:600]     Physical Exam: General: Alert, awake, oriented x3, in no acute distress. Heart: Regular rate and rhythm, without murmurs, rubs, gallops. Lungs: Clear to auscultation bilaterally. Abdomen: Soft,  mildly tender on deep palpation, no rebound or rigidity , nondistended, positive bowel sounds. Extremities: No clubbing cyanosis or edema with positive pedal pulses. Neuro: Grossly intact, nonfocal.    Lab Results: No results found for this or any previous visit (from the past 24 hour(s)).  Studies/Results: No results found.  Medications:    . bisoprolol  2.5 mg Oral Daily  . famotidine  20 mg Oral QHS  . polyethylene glycol  17 g Oral Daily  . senna-docusate  2 tablet Oral QHS  . sertraline  100 mg Oral Daily  . simvastatin  40 mg Oral q1800  . traZODone  50 mg Oral QHS    acetaminophen, acetaminophen, ALPRAZolam, alum & mag hydroxide-simeth, HYDROcodone-acetaminophen, HYDROmorphone, meclizine, ondansetron (ZOFRAN) IV, ondansetron, polyvinyl alcohol     . sodium chloride 50 mL/hr at 01/15/12 1524    Assessment/Plan:  Principal Problem:  *Compression fracture of T12 vertebra  She was seen by Dr. Audrie Lia who recommended conservative management with  pain control, brace and PT .Marland Kitchen Daughter  Requested neurosurgical consultation as a second opinion since  patient's primary orthopedics in [place] [facility] Recommended surgery as per her but he is out of town.  Dr. Dutch Quint was consulted .  Active Problems:  HTN (hypertension): Controlled, continue bisoprolol  Constipation,:start laxatives and monitor.will order KUB  Hyperlipidemia: Continue statins  DVT prophylaxis: SCDs     LOS: 2 days   Triston Skare 01/15/2012, 4:07 PM

## 2012-01-15 NOTE — Progress Notes (Addendum)
Clinical Social Work Department CLINICAL SOCIAL WORK PLACEMENT NOTE 01/15/2012  Patient:  Lindsey Barnes, Lindsey Barnes  Account Number:  [id number] Admit date:  01/13/2012  Clinical Social Worker:  Unk Lightning, LCSW  Date/time:  01/15/2012 04:45 PM  Clinical Social Work is seeking post-discharge placement for this patient at the following level of care:   SKILLED NURSING   (*CSW will update this form in Epic as items are completed)   01/15/2012  Patient/family provided with [facility] Department of Clinical Social Work's list of facilities offering this level of care within the geographic area requested by the patient (or if unable, by the patient's family).  01/15/2012  Patient/family informed of their freedom to choose among providers that offer the needed level of care, that participate in Medicare, Medicaid or managed care program needed by the patient, have an available bed and are willing to accept the patient.  01/15/2012  Patient/family informed of [facility]' ownership interest in [facility], as well as of the fact that they are under no obligation to receive care at this facility.  PASARR submitted to EDS on 01/15/2012 PASARR number received from [employer]DS on 01/15/2012  FL2 transmitted to all facilities in geographic area requested by pt/family on  01/15/2012 FL2 transmitted to all facilities within larger geographic area on   Patient informed that his/her managed care company has contracts with or will negotiate with  certain facilities, including the following:     Patient/family informed of bed offers received:  01/18/2012 Patient chooses bed at [facility] Physician recommends and patient chooses bed at    Patient to be transferred to  on  01/19/2012 Patient to be transferred to facility by [facility]  The following physician request were entered in Epic:   Additional Comments:

## 2012-01-15 NOTE — Progress Notes (Signed)
Orthopedic Tech Progress Note Patient Details:  Lindsey Barnes [birth year] [id number]  Patient ID: Lindsey Barnes, female   DOB: [birth year], 78 y.o.   MRN: [id number] [facility] bio-tech @ [address] 01/15/2012, 4:39 PM

## 2012-01-16 MED ORDER — HYDROCODONE-ACETAMINOPHEN 5-325 MG PO TABS
2.0000 | ORAL_TABLET | ORAL | Status: DC | PRN
  Administered 2012-01-16 – 2012-01-18 (×6): 2 via ORAL
  Filled 2012-01-16 (×6): qty 2

## 2012-01-16 MED ORDER — BISACODYL 10 MG RE SUPP
10.0000 mg | Freq: Every day | RECTAL | Status: DC | PRN
  Administered 2012-01-18: 10 mg via RECTAL
  Filled 2012-01-16 (×2): qty 1

## 2012-01-16 MED ORDER — MINERAL OIL RE ENEM
1.0000 | ENEMA | Freq: Once | RECTAL | Status: AC
  Administered 2012-01-17: 1 via RECTAL
  Filled 2012-01-16: qty 1

## 2012-01-16 MED ORDER — HYDROMORPHONE HCL PF 1 MG/ML IJ SOLN
1.0000 mg | INTRAMUSCULAR | Status: DC | PRN
  Administered 2012-01-16 – 2012-01-18 (×6): 1 mg via INTRAVENOUS
  Filled 2012-01-16 (×6): qty 1

## 2012-01-16 MED ORDER — HYDROCODONE-ACETAMINOPHEN 5-325 MG PO TABS: 2.0000 | ORAL_TABLET | Freq: Four times a day (QID) | ORAL | Status: DC | PRN

## 2012-01-16 NOTE — Progress Notes (Signed)
Physical Therapy Treatment Patient Details Name: Lindsey Barnes MRN: [id number] DOB: 11/20/1932 Today's Date: 01/16/2012  PT Assessment/Plan  PT - Assessment/Plan Comments on Treatment Session: Pt progressing well with increased ambulation distance. Pt still limited by pain with all mobility, RN aware PT Plan: Discharge plan remains appropriate;Frequency remains appropriate PT Frequency: Min 5X/week Follow Up Recommendations: Skilled nursing facility Equipment Recommended: Rolling walker with 5" wheels PT Goals  Acute Rehab PT Goals PT Goal Formulation: With patient PT Goal: Rolling Supine to Left Side - Progress: Met PT Goal: Supine/Side to Sit - Progress: Progressing toward goal PT Goal: Sit to Stand - Progress: Progressing toward goal PT Goal: Stand to Sit - Progress: Progressing toward goal PT Goal: Ambulate - Progress: Progressing toward goal Additional Goals PT Goal: Additional Goal #1 - Progress: Progressing toward goal  PT Treatment Precautions/Restrictions  Precautions Precautions: Back Precaution Booklet Issued: No Precaution Comments: Pt able to verbalize 2/3 back precautions Required Braces or Orthoses: Yes Spinal Brace: Lumbar corset Restrictions Weight Bearing Restrictions: No Mobility (including Balance) Bed Mobility Bed Mobility: Yes Rolling Right: 6: Modified independent (Device/Increase time);With rail Rolling Left: 6: Modified independent (Device/Increase time);With rail Left Sidelying to Sit: 4: Min assist;With rails;HOB elevated (comment degrees) Left Sidelying to Sit Details (indicate cue type and reason): Min assist through trunk for stability into transfer and to improve upright posture Sitting - Scoot to Edge of Bed: 6: Modified independent (Device/Increase time) Transfers Transfers: Yes Sit to Stand: 4: Min assist;With upper extremity assist;From bed Sit to Stand Details (indicate cue type and reason): VC for sequencing to maintain back  precautions. VC for hand placement and safety upon standing Stand to Sit: 5: Supervision;With armrests;To chair/3-in-1 Stand to Sit Details: VC for proper hand placement and sequencing. Pt able to control descent Ambulation/Gait Ambulation/Gait: Yes Ambulation/Gait Assistance: 4: Min assist (Minguard assist) Ambulation/Gait Assistance Details (indicate cue type and reason): VC throughout for postural cues and sequencing with RW Ambulation Distance (Feet): 75 Feet Assistive device: Rolling walker Gait Pattern: Step-to pattern;Decreased stride length Gait velocity: Decreased Stairs: No    Exercise    End of Session PT - End of Session Equipment Utilized During Treatment: Gait belt;Back brace Activity Tolerance: Patient limited by pain Patient left: in chair;with call bell in reach;with family/visitor present Nurse Communication: Mobility status for transfers;Mobility status for ambulation General Behavior During Session: [facility] for tasks performed Cognition: [facility] for tasks performed  Milana Kidney 01/16/2012, 5:05 PM  01/16/2012 Milana Kidney DPT PAGER: [phone] OFFICE: [phone]

## 2012-01-16 NOTE — Progress Notes (Signed)
Subjective: Patient seen and examined, complaining of back pain when she moves. Had a small bowel movement this morning.  Objective: Vital signs in last 24 hours: Temp:  [98.3 F (36.8 C)-99.1 F (37.3 C)] 98.9 F (37.2 C) (04/06 0544) Pulse Rate:  [70-76] 74  (04/06 0544) Resp:  [18-20] 20  (04/06 0544) BP: (133-166)/(52-82) 154/82 mmHg (04/06 0638) SpO2:  [92 %-94 %] 94 % (04/06 0544) Weight change:  Last BM Date: 01/13/12  Intake/Output from previous day: 04/05 0701 - 04/06 0700 In: 770 [P.O.:220; I.V.:550] Out: 2450 [Urine:2450]     Physical Exam: General: Alert, awake, oriented x3, in no acute distress. Heart: Regular rate and rhythm, without murmurs, rubs, gallops. Lungs: Clear to auscultation bilaterally. Abdomen: Soft, nontender, nondistended, positive bowel sounds. Extremities: No clubbing cyanosis or edema with positive pedal pulses. Neuro: Grossly intact, nonfocal.    Lab Results: No results found for this or any previous visit (from the past 24 hour(s)).  Studies/Results: Dg Abd Portable 1v  01/15/2012  *RADIOLOGY REPORT*  Clinical Data: Constipation, abdominal pain  PORTABLE ABDOMEN - 1 VIEW  Comparison: Portable exam 1648 hours without priors for comparison  Findings: Motion artifacts limit assessment. Gas and scattered stool throughout a nondistended colon. No definite bowel wall thickening or evidence of obstruction identified. Gas is present into the rectum. Pubic symphysis and mid to distal rectum are excluded. Bones appear diffusely demineralized with evidence of a compression fracture at T12 and prior vertebroplasty of L2. No definite urinary tract calcification. Small potential pelvic phleboliths.  IMPRESSION: Gas and stool throughout the colon without definite evidence of obstruction or wall thickening on limited exam as above.  Original Report Authenticated By: Lollie Marrow, M.D.    Medications:    . bisoprolol  2.5 mg Oral Daily  . famotidine  20  mg Oral QHS  . polyethylene glycol  17 g Oral Daily  . senna-docusate  2 tablet Oral QHS  . sertraline  100 mg Oral Daily  . simvastatin  40 mg Oral q1800  . traZODone  50 mg Oral QHS    acetaminophen, acetaminophen, ALPRAZolam, alum & mag hydroxide-simeth, HYDROcodone-acetaminophen, HYDROmorphone, meclizine, ondansetron (ZOFRAN) IV, ondansetron, polyvinyl alcohol     . sodium chloride 50 mL/hr at 01/15/12 1524    Assessment/Plan:  Principal Problem:  *Compression fracture of T12 vertebra  Continue conservative management with pain control, brace and PT . Increase IV Dilaudid dose and frequency of severe pain /increased local to 2 tablets every 4 hours when necessary for moderate pain . Active Problems:  HTN (hypertension): Her pressure fluctuates probably exacerbated by pain , continue bisoprolol  Constipation,: Continue and adjust laxatives .KUB showed constipation Hyperlipidemia: Continue statins  DVT prophylaxis: SCDs  Disposition: To SNF  when bed available      LOS: 3 days   Lindsey Barnes 01/16/2012, 10:54 AM

## 2012-01-17 NOTE — Progress Notes (Signed)
Subjective: Patient seen and examined, currently pain-free when lying still however develops severe back pain when she moves. She had a small bowel movement last night  Objective: Vital signs in last 24 hours: Temp:  [98 F (36.7 C)-98.4 F (36.9 C)] 98.4 F (36.9 C) (04/07 0549) Pulse Rate:  [69-72] 70  (04/07 0549) Resp:  [18-20] 20  (04/07 0549) BP: (135-152)/(69-74) 135/70 mmHg (04/07 0549) SpO2:  [92 %-93 %] 92 % (04/07 0549) Weight change:  Last BM Date: 01/16/12  Intake/Output from previous day: 04/06 0701 - 04/07 0700 In: 120 [P.O.:120] Out: 450 [Urine:450]     Physical Exam: General: Alert, awake, oriented x3, in no acute distress. Heart: Regular rate and rhythm, without murmurs, rubs, gallops. Lungs: Clear to auscultation bilaterally. Abdomen: Soft, mildly tender, nondistended, positive bowel sounds. Extremities: No clubbing cyanosis or edema with positive pedal pulses. Neuro: Grossly intact, nonfocal.    Lab Results: No results found for this or any previous visit (from the past 24 hour(s)).  Studies/Results: Dg Abd Portable 1v  01/15/2012  *RADIOLOGY REPORT*  Clinical Data: Constipation, abdominal pain  PORTABLE ABDOMEN - 1 VIEW  Comparison: Portable exam 1648 hours without priors for comparison  Findings: Motion artifacts limit assessment. Gas and scattered stool throughout a nondistended colon. No definite bowel wall thickening or evidence of obstruction identified. Gas is present into the rectum. Pubic symphysis and mid to distal rectum are excluded. Bones appear diffusely demineralized with evidence of a compression fracture at T12 and prior vertebroplasty of L2. No definite urinary tract calcification. Small potential pelvic phleboliths.  IMPRESSION: Gas and stool throughout the colon without definite evidence of obstruction or wall thickening on limited exam as above.  Original Report Authenticated By: Lollie Marrow, M.D.    Medications:    . bisoprolol   2.5 mg Oral Daily  . famotidine  20 mg Oral QHS  . mineral oil  1 enema Rectal Once  . polyethylene glycol  17 g Oral Daily  . senna-docusate  2 tablet Oral QHS  . sertraline  100 mg Oral Daily  . simvastatin  40 mg Oral q1800  . traZODone  50 mg Oral QHS    acetaminophen, acetaminophen, ALPRAZolam, alum & mag hydroxide-simeth, bisacodyl, HYDROcodone-acetaminophen, HYDROmorphone, meclizine, ondansetron (ZOFRAN) IV, ondansetron, polyvinyl alcohol, DISCONTD: HYDROcodone-acetaminophen     . sodium chloride 50 mL/hr at 01/16/12 1240    Assessment/Plan:  Principal Problem:  *Compression fracture of T12 vertebra  Continue conservative management with pain control, brace and PT . Continue IV Dilaudid for severe pain /Norco when necessary for moderate pain . Adjustment made yesterday with some improvement in her pain level. Active Problems:  HTN (hypertension): Improved, Her pressure fluctuates probably exacerbated by pain , continue bisoprolol  Constipation,: Continue and adjust laxatives .KUB showed constipation  Hyperlipidemia: Continue statins  DVT prophylaxis: SCDs  Disposition: To SNF when bed available       LOS: 4 days   Lindsey Barnes 01/17/2012, 1:26 PM

## 2012-01-18 MED ORDER — LISINOPRIL 10 MG PO TABS
10.0000 mg | ORAL_TABLET | Freq: Every day | ORAL | Status: DC
  Administered 2012-01-18 – 2012-01-19 (×2): 10 mg via ORAL
  Filled 2012-01-18 (×3): qty 1

## 2012-01-18 NOTE — Progress Notes (Signed)
Utilization Review Completed.Lindsey Barnes T4/05/2012   

## 2012-01-18 NOTE — Progress Notes (Signed)
Physical Therapy Treatment Patient Details Name: KHIARA SHUPING MRN: [id number] DOB: 05-13-1933 Today's Date: 01/18/2012  PT Assessment/Plan  PT - Assessment/Plan Comments on Treatment Session: Pt progressing with ambulation and improved gait pattern.  Pt continues to be limited by pain with all mobility.  Pt encouraged to walk atleast once more today with RN. PT Plan: Discharge plan remains appropriate;Frequency remains appropriate PT Frequency: Min 5X/week Follow Up Recommendations: Skilled nursing facility Equipment Recommended: Rolling walker with 5" wheels PT Goals  Acute Rehab PT Goals PT Goal Formulation: With patient Time For Goal Achievement: 7 days Pt will Roll Supine to Left Side: with modified independence;with rail PT Goal: Rolling Supine to Left Side - Progress: Progressing toward goal Pt will go Supine/Side to Sit: with modified independence;with rail PT Goal: Supine/Side to Sit - Progress: Progressing toward goal Pt will go Sit to Stand: with modified independence;with upper extremity assist PT Goal: Sit to Stand - Progress: Progressing toward goal Pt will go Stand to Sit: with modified independence;with upper extremity assist PT Goal: Stand to Sit - Progress: Progressing toward goal Pt will Ambulate: 51 - 150 feet;with modified independence;with least restrictive assistive device PT Goal: Ambulate - Progress: Progressing toward goal Additional Goals Additional Goal #1: Pt will be able to describe and demonstrate 3/3 back precautions. PT Goal: Additional Goal #1 - Progress: Progressing toward goal  PT Treatment Precautions/Restrictions  Precautions Precautions: Back Precaution Booklet Issued: No Precaution Comments: Pt able to verbalize 2/3 back precautions.  Reviewed 3/3 back precautions. Required Braces or Orthoses: Yes Spinal Brace: Thoracolumbosacral orthotic;Applied in sitting position Restrictions Weight Bearing Restrictions: No Mobility (including  Balance) Bed Mobility Bed Mobility: Yes Rolling Right: Other (comment);With rail (Min guard) Rolling Right Details (indicate cue type and reason): Pt required min guard for safety with VC for sequencing to follow back precautions. Rolling Left: Other (comment);With rail (Min guard) Rolling Left Details (indicate cue type and reason): Pt required min guard for safety and VC for sequencing to follow back precautions. Left Sidelying to Sit: 4: Min assist;With rails;HOB elevated (comment degrees) Left Sidelying to Sit Details (indicate cue type and reason): Pt required min assist at shoulders for stability and to initiate transfer. Sitting - Scoot to Edge of Bed: 6: Modified independent (Device/Increase time) Sit to Sidelying Left: Other (comment);With rail (MIn guard) Sit to Sidelying Left Details (indicate cue type and reason): Pt required min guard to safely lower onto side. Transfers Transfers: Yes Sit to Stand: 4: Min assist;With upper extremity assist;From bed Sit to Stand Details (indicate cue type and reason): Pt required min assist to complete transfer and for safety with VC for hand placement with RW. Stand to Sit: Other (comment);With upper extremity assist;To bed (Min guard) Stand to Sit Details: Pt required min guard for safety to assist with controlling descent into chair and VC for hand placement with RW. Ambulation/Gait Ambulation/Gait: Yes Ambulation/Gait Assistance: 4: Min assist Ambulation/Gait Assistance Details (indicate cue type and reason): Pt required min assist for safety due to impaired balance. Ambulation Distance (Feet): 75 Feet Assistive device: Rolling walker Gait Pattern: Step-to pattern;Decreased stride length Gait velocity: Decreased Stairs: No Wheelchair Mobility Wheelchair Mobility: No  Posture/Postural Control Posture/Postural Control: No significant limitations Balance Balance Assessed: No Exercise    End of Session PT - End of Session Equipment  Utilized During Treatment: Gait belt;Back brace Activity Tolerance: Patient limited by pain Patient left: in bed;with call bell in reach;with family/visitor present Nurse Communication: Mobility status for transfers;Mobility status for ambulation (Made  RN aware of pain level) General Behavior During Session: [facility] for tasks performed Cognition: [facility] for tasks performed  Ezzard Standing SPT 01/18/2012, 11:29 AM Jake Shark, PT DPT [phone]

## 2012-01-18 NOTE — Progress Notes (Signed)
CSW spoke with family who requested to expand search. CSW faxed information to [facility] who accepted patient. CSW called and informed dtr of bed offer who stated she would inform patient. CSW will continue to follow. [place], [place] [phone]

## 2012-01-18 NOTE — Progress Notes (Addendum)
Subjective: Patient seen and examined, complaining of back pain with movement, she had been working with PT and had a bowel movement this morning  Objective: Vital signs in last 24 hours: Temp:  [97.9 F (36.6 C)-98.3 F (36.8 C)] 97.9 F (36.6 C) (04/08 1400) Pulse Rate:  [65-71] 70  (04/08 1400) Resp:  [19-20] 19  (04/08 1400) BP: (141-178)/(68-91) 164/91 mmHg (04/08 1400) SpO2:  [94 %-96 %] 96 % (04/08 1400) Weight change:  Last BM Date: 01/18/12  Intake/Output from previous day: 04/07 0701 - 04/08 0700 In: -  Out: 1750 [Urine:1750] Total I/O In: 480 [P.O.:480] Out: 350 [Urine:350]   Physical Exam: General: Alert, awake, oriented x3, in no acute distress.  Heart: Regular rate and rhythm, without murmurs, rubs, gallops.  Lungs: Clear to auscultation bilaterally.  Abdomen: Soft, mildly tender, nondistended, positive bowel sounds.  Extremities: No clubbing cyanosis or edema with positive pedal pulses.  Neuro: Grossly intact, nonfocal   Lab Results: No results found for this or any previous visit (from the past 24 hour(s)).  Studies/Results: No results found.  Medications:    . bisoprolol  2.5 mg Oral Daily  . famotidine  20 mg Oral QHS  . polyethylene glycol  17 g Oral Daily  . senna-docusate  2 tablet Oral QHS  . sertraline  100 mg Oral Daily  . simvastatin  40 mg Oral q1800  . traZODone  50 mg Oral QHS    acetaminophen, acetaminophen, ALPRAZolam, alum & mag hydroxide-simeth, bisacodyl, meclizine, ondansetron (ZOFRAN) IV, ondansetron, polyvinyl alcohol, DISCONTD: HYDROcodone-acetaminophen, DISCONTD: HYDROmorphone     . sodium chloride 50 mL/hr at 01/16/12 1240    Assessment/Plan:  Principal Problem:  *Compression fracture of T12 vertebra  Continue conservative management with pain control, brace and PT . Discontinue IV Dilaudid and continue with Norco when necessary for pain control. Active Problems:  HTN (hypertension): Her pressure fluctuates  probably exacerbated by pain , continue bisoprolol , add  Lisinopril , resume hydrochlorothiazide  When DC Constipation,: Continue and adjust laxatives .KUB showed constipation  Hyperlipidemia: Continue statins  DVT prophylaxis: SCDs  Disposition: To SNF possibly tomorrow.       LOS: 5 days   Lindsey Barnes 01/18/2012, 6:18 PM

## 2012-01-19 ENCOUNTER — Encounter: Payer: Self-pay | Admitting: Internal Medicine

## 2012-01-19 MED ORDER — POLYETHYLENE GLYCOL 3350 17 G PO PACK: 17.0000 g | PACK | Freq: Every day | ORAL | Status: AC

## 2012-01-19 MED ORDER — SENNOSIDES-DOCUSATE SODIUM 8.6-50 MG PO TABS: 2.0000 | ORAL_TABLET | Freq: Every day | ORAL | Status: AC

## 2012-01-19 MED ORDER — LISINOPRIL 10 MG PO TABS: 10.0000 mg | ORAL_TABLET | Freq: Every day | ORAL | Status: DC

## 2012-01-19 MED ORDER — ALPRAZOLAM 0.25 MG PO TABS: 0.2500 mg | ORAL_TABLET | Freq: Three times a day (TID) | ORAL | Status: DC | PRN

## 2012-01-19 MED ORDER — FENTANYL 25 MCG/HR TD PT72: 1.0000 | MEDICATED_PATCH | TRANSDERMAL | Status: AC

## 2012-01-19 MED ORDER — HYDROCODONE-ACETAMINOPHEN 5-500 MG PO TABS: 1.0000 | ORAL_TABLET | ORAL | Status: DC | PRN

## 2012-01-19 NOTE — Discharge Summary (Signed)
Patient ID: Lindsey Barnes MRN: [id number] DOB/AGE: 1933-09-03 78 y.o.  Admit date: 01/13/2012 Discharge date: 01/19/2012  Primary Care Physician:  No primary provider on file.   Discharge Diagnoses:    Present on Admission:  .Compression fracture of T12 vertebra .HTN (hypertension) .Hyperlipidemia .Low back pain constipation  Medication List  As of 01/19/2012 10:08 AM   TAKE these medications         ALPRAZolam 0.25 MG tablet   Commonly known as: XANAX   Take 0.25 mg by mouth 3 (three) times daily as needed. For anxiety      bisoprolol-hydrochlorothiazide 2.5-6.25 MG per tablet   Commonly known as: ZIAC   Take 1 tablet by mouth daily.      fentaNYL 25 MCG/HR   Commonly known as: DURAGESIC - dosed mcg/hr   Place 1 patch (25 mcg total) onto the skin every 3 (three) days.      HYDROcodone-acetaminophen 5-500 MG per tablet   Commonly known as: VICODIN   Take 1-2 tablets by mouth every 4 (four) hours as needed. For pain      lisinopril 10 MG tablet   Commonly known as: PRINIVIL,ZESTRIL   Take 1 tablet (10 mg total) by mouth daily.      meclizine 25 MG tablet   Commonly known as: ANTIVERT   Take 25 mg by mouth 3 (three) times daily as needed. For dizziness      polyethylene glycol packet   Commonly known as: MIRALAX / GLYCOLAX   Take 17 g by mouth daily.      raloxifene 60 MG tablet   Commonly known as: EVISTA   Take 60 mg by mouth daily.      ranitidine 150 MG tablet   Commonly known as: ZANTAC   Take 150 mg by mouth 2 (two) times daily.      senna-docusate 8.6-50 MG per tablet   Commonly known as: Senokot-S   Take 2 tablets by mouth at bedtime.      sertraline 100 MG tablet   Commonly known as: ZOLOFT   Take 100 mg by mouth daily.      simvastatin 40 MG tablet   Commonly known as: ZOCOR   Take 40 mg by mouth every evening.      traZODone 50 MG tablet   Commonly known as: DESYREL   Take 50 mg by mouth at bedtime.             Consults:   Dr  Phil Dopp) Dr Poole(neurosurgery)   Significant Diagnostic Studies:  Dg Abd Portable 1v  01/15/2012  *RADIOLOGY REPORT*  Clinical Data: Constipation, abdominal pain  PORTABLE ABDOMEN - 1 VIEW  Comparison: Portable exam 1648 hours without priors for comparison  Findings: Motion artifacts limit assessment. Gas and scattered stool throughout a nondistended colon. No definite bowel wall thickening or evidence of obstruction identified. Gas is present into the rectum. Pubic symphysis and mid to distal rectum are excluded. Bones appear diffusely demineralized with evidence of a compression fracture at T12 and prior vertebroplasty of L2. No definite urinary tract calcification. Small potential pelvic phleboliths.  IMPRESSION: Gas and stool throughout the colon without definite evidence of obstruction or wall thickening on limited exam as above.  Original Report Authenticated By: Lollie Marrow, M.D.    Brief H and P: For complete details please refer to admission H and P, but in brief  78 year-old female with history of hypertension, hyperlipidemia, cancer breast status post right-sided mastectomy 20 years ago,  history of lumbar fracture status post kyphoplasty had a fall at her home while trying to [facility]. She is and fell on her back but did not lose consciousness and did not hit her head. Patient came to the ER at [facility] and CAT scan of the spine done showed T12 compression fracture. Patient usually follows with Dr. Gerrit Heck, orthopedic surgeon, who is out of town so for further management patient has been transferred to [facility]. Patient denies any incontinence of urine or bowel. Patient's pain is controlled with Dilaudid.  Patient denies any chest pain, shortness of breath, any weakness or lower extremity, nausea vomiting.  Patient's pain is on the mid back radiating to her front and also complaining of mild epigastric tenderness.    Hospital Course:  *Compression  fracture of T12 vertebra  Patient was admitted to the medical floor, she was treated with IV Dilaudid and Norco for pain control. She was evaluated by orthopedics and neurosurgery services and  conservative management with pain control, brace and PT was recommended . IV Dilaudid with titrated to DC and oral medication was adjusted, patient continued to have pain with movement. Fentanyl patch started on discharge, she will need followup and further titration of her pain regimen as needed. Patient seen by physical therapy and discharged to skilled facility was recommended. Active Problems:  HTN (hypertension): Uncontrolled , Her pressure fluctuates probably exacerbated by pain , continue bisoprolol /hctz, lisinopril was added , would probably need further adjustment of her hypertensive regimen, follow as an outpatient. Constipation,: She was treated with laxatives .KUB showed constipation without obstruction. Patient had several bowel movement. Hyperlipidemia: Continued on statins  DVT prophylaxis: SCDs  Disposition: To SNF today   Subjective: Patient seen and examined, complaining of back pain with movement.   Filed Vitals:   01/19/12 0528  BP: 181/69  Pulse: 70  Temp: 98 F (36.7 C)  Resp: 20    General: Alert, awake, oriented x3, in no acute distress.  Heart: Regular rate and rhythm, without murmurs, rubs, gallops.  Lungs: Clear to auscultation bilaterally.  Abdomen: Soft, mildly tender, nondistended, positive bowel sounds.  Extremities: No clubbing cyanosis or edema with positive pedal pulses.  Neuro: Grossly intact, nonfocal   Disposition and Follow-up:  To skilled medicine facility Follow with PCP and orthopedics.   Time spent on Discharge: Approximately 45 minutes   Signed: Thyra Yinger 01/19/2012, 10:08 AM

## 2012-01-19 NOTE — Progress Notes (Signed)
Clinical Social Work  MD reported patient was ready for DC. CSW faxed dc summary and medications to SNF. [facility]). SNF agreeable to admission. CSW gave RN report number Lynden Ang [phone]). CSW prepared dc packet which included FL2 and hard scripts.  CSW spoke with patient and dtr who were agreeable to dc. Patient wishes to use [facility]. CSW coordinated transportation via [facility] to pick up around 1230. CSW is signing off.   [place], [place] [phone]

## 2012-01-29 LAB — URINALYSIS, COMPLETE
Bilirubin,UR: NEGATIVE
Glucose,UR: NEGATIVE mg/dL (ref 0–75)
Ph: 5 (ref 4.5–8.0)
Protein: NEGATIVE
Specific Gravity: 1.014 (ref 1.003–1.030)
Squamous Epithelial: 6

## 2012-01-30 LAB — URINE CULTURE

## 2012-02-01 ENCOUNTER — Observation Stay: Payer: Self-pay | Admitting: Internal Medicine

## 2012-02-01 LAB — COMPREHENSIVE METABOLIC PANEL
Albumin: 3.9 g/dL (ref 3.4–5.0)
Alkaline Phosphatase: 171 U/L — ABNORMAL HIGH (ref 50–136)
Bilirubin,Total: 0.7 mg/dL (ref 0.2–1.0)
Calcium, Total: 9.2 mg/dL (ref 8.5–10.1)
Chloride: 102 mmol/L (ref 98–107)
Creatinine: 0.83 mg/dL (ref 0.60–1.30)
Osmolality: 277 (ref 275–301)
SGOT(AST): 14 U/L — ABNORMAL LOW (ref 15–37)
Sodium: 139 mmol/L (ref 136–145)
Total Protein: 7.3 g/dL (ref 6.4–8.2)

## 2012-02-01 LAB — CBC
MCV: 91 fL (ref 80–100)
RBC: 4.71 10*6/uL (ref 3.80–5.20)
WBC: 4.8 10*3/uL (ref 3.6–11.0)

## 2012-02-01 LAB — URINALYSIS, COMPLETE
Glucose,UR: NEGATIVE mg/dL (ref 0–75)
Ph: 8 (ref 4.5–8.0)
RBC,UR: 4 /HPF (ref 0–5)
Specific Gravity: 1.006 (ref 1.003–1.030)
WBC UR: 4 /HPF (ref 0–5)

## 2012-02-02 LAB — CBC WITH DIFFERENTIAL/PLATELET
Basophil #: 0.1 10*3/uL (ref 0.0–0.1)
Basophil %: 1.4 %
Eosinophil %: 1.3 %
HCT: 39.6 % (ref 35.0–47.0)
Lymphocyte %: 25.2 %
MCH: 29.8 pg (ref 26.0–34.0)
MCHC: 32.5 g/dL (ref 32.0–36.0)
Monocyte #: 0.5 x10 3/mm (ref 0.2–0.9)
RBC: 4.3 10*6/uL (ref 3.80–5.20)
RDW: 13.8 % (ref 11.5–14.5)
WBC: 5.2 10*3/uL (ref 3.6–11.0)

## 2012-02-02 LAB — BASIC METABOLIC PANEL
Anion Gap: 8 (ref 7–16)
Calcium, Total: 8.7 mg/dL (ref 8.5–10.1)
Co2: 28 mmol/L (ref 21–32)
Creatinine: 0.86 mg/dL (ref 0.60–1.30)
EGFR (Non-African Amer.): >60
Osmolality: 281 (ref 275–301)

## 2012-02-02 LAB — MAGNESIUM: Magnesium: 2 mg/dL

## 2012-02-10 ENCOUNTER — Encounter: Payer: Self-pay | Admitting: Internal Medicine

## 2012-12-05 ENCOUNTER — Ambulatory Visit: Payer: Self-pay | Admitting: Family Medicine

## 2013-11-20 ENCOUNTER — Ambulatory Visit: Payer: Self-pay | Admitting: Gastroenterology

## 2013-12-06 ENCOUNTER — Ambulatory Visit: Payer: Self-pay | Admitting: Family Medicine

## 2013-12-24 ENCOUNTER — Emergency Department: Payer: Self-pay | Admitting: Emergency Medicine

## 2014-12-10 ENCOUNTER — Ambulatory Visit: Payer: Self-pay | Admitting: Family Medicine

## 2014-12-11 DIAGNOSIS — G4700 Insomnia, unspecified: Secondary | ICD-10-CM | POA: Insufficient documentation

## 2015-02-03 NOTE — Discharge Summary (Signed)
PATIENT NAME:  Lindsey Barnes, Lindsey Barnes MR#:  [id number] DATE OF BIRTH:  05/24/1933  DATE OF ADMISSION:  02/01/2012 DATE OF DISCHARGE:  02/02/2012  PRIMARY CARE PHYSICIAN: Juluis Pitch, MD   DISCHARGE DIAGNOSES:  1. Back pain with compression fracture.  2. Hypertension.  3. Hypokalemia, which resolved.  4. Gastroesophageal reflux disease.  5. Hyperlipidemia.  6. Constipation.  7. Osteoporosis.  8. No urinary tract infection.   MEDICATIONS ON DISCHARGE:  1. Xanax 0.25 mg 3 times a day. 2. Ziac 1 tablet daily 2.5/6.25. 3. Evista 60 mg daily.  4. Meclizine 25 mg every six hours as needed for dizziness. 5. Ranitidine 150 mg twice a day.  6. Zoloft 100 mg daily.  7. Simvastatin 40 mg daily.  8. Trazodone 50 mg at bedtime.  9. MiraLAX 17 grams with 8 ounces of fluid every 12 hours as needed for constipation. 10. Tylenol 325 mg every four hours as needed for pain.  11. Promethazine 25 mg every four hours as needed for nausea, vomiting. 12. Lisinopril 10 mg daily.  13. Senna Plus 50/8.6 two tablets at bedtime.  14. Flector patch 1.3% apply to mid back every 12 hours. 15. Vitamin D3 2000 IU daily.  16. Oxycodone 5 mg every four hours as needed for pain.   DIET: Low sodium diet.   ACTIVITY: Activity as tolerated.   FOLLOW-UP:  1. Keep appointment with Dr. Mauri Pole of Orthopedics at 1:30 p.m. tomorrow.  2. Home health PT. 3. Follow-up in 1 to 2 weeks with Dr. Lovie Macadamia.   REASON FOR ADMISSION: The patient was admitted as an observation on 02/01/2012 and discharged 02/02/2012, came in with severe back pain.   HISTORY OF PRESENT ILLNESS: Around three weeks ago she fell and hurt her back, was seen in the ER and found to have a compression fracture. She was transferred to [facility] from the ER since Dr. Mauri Pole was not available here. Then she went to [facility] after the hospital course there. She was sent over for an MRI of the back but she had severe pain with transporting, could not tolerate  the MRI. She was sent in to the ER for further evaluation. She was very uncomfortable with 100 out of 10 in intensity pain and hospitalist services were contacted for further evaluation. She was admitted for an observation. An MRI of the back was ordered. Orthopedic consultation was ordered for suspected urinary tract infection. She was started on Cipro. For hypokalemia she was replaced on potassium.   LABORATORY AND RADIOLOGICAL DATA DURING THE HOSPITAL COURSE: Urine culture showed mixed bacterial organisms. Urinalysis 1+ leukocyte esterase. White blood cell count 4.8, hemoglobin and hematocrit 14.2 and 42.8, platelet count 211, glucose 111, BUN 8, creatinine 0.83, sodium 139, potassium 3.1, chloride 102, CO2 26, calcium 9.2, alkaline phosphatase 171, AST 14, ALT 15. Magnesium 2.0. Repeat potassium up at 3.8   HOSPITAL COURSE PER PROBLEM LIST:  1. For the patient's back pain and compression fracture, the patient again refused MRI of the back. Physical therapy walked with her and she walked up to the nursing station with a walker. She does have a follow-up appointment tomorrow with Dr. Mauri Pole so Orthopedic consultation was canceled and the patient was discharged home and she will follow-up tomorrow with Dr. Mauri Pole. The patient does feel better without the back brace that was given to her so I said she can go to the orthopedic appointment without the back brace and follow-up with Dr. Mauri Pole there.  2. For the patient's hypertension, blood  pressure was controlled with her usual medications.  3. For her hypokalemia, this was resolved with replacement.  4. Gastroesophageal reflux disease. She is on ranitidine.  5. Hyperlipidemia. She is on simvastatin.  6. For constipation, she is on MiraLAX and Senna Plus.  7. For osteoporosis, she is on Evista.  8. No urinary tract infection. No symptoms. Urine culture showed contamination. Stop antibiotics.   DISPOSITION: The patient was discharged home in stable  condition with home health PT and a walker. Follow-up with Dr. Mauri Pole as outpatient.   TIME SPENT ON DISCHARGE: 40 minutes.   ____________________________ Tana Conch. Leslye Peer, MD rjw:drc D: 02/02/2012 16:48:08 ET T: 02/03/2012 10:22:09 ET JOB#: [id number]  cc: Tana Conch. Leslye Peer, MD, <Dictator> Youlanda Roys. Lovie Macadamia, MD Alysia Penna Mauri Pole, MD Marisue Brooklyn MD ELECTRONICALLY SIGNED 02/04/2012 17:30

## 2015-02-03 NOTE — H&P (Signed)
PATIENT NAME:  Lindsey Barnes, Lindsey Barnes MR#:  [id number] DATE OF BIRTH:  1933-08-11  DATE OF ADMISSION:  02/01/2012  PRIMARY CARE PHYSICIAN:  Dr. Lovie Macadamia.   CHIEF COMPLAINT: Back pain.   HISTORY OF PRESENT ILLNESS: Around three weeks ago the patient had a fall and hurt her back. She was seen in the ER and found to have a compression fracture. She was transferred to [facility] from the ER because Dr. Mauri Pole was not available. She was there for one week and then sent over to [facility] rehab. She has been having severe pain in her back since then and Dr. Ouida Sills at [facility] ordered an MRI but she could not even tolerate the MRI, had a lot of pain with the transfer over and pain in the MRI machine and could not do it. She has pains in her back wrapping around her abdomen on both sides. She did have a follow-up appointment on Wednesday with Dr. Mauri Pole. Pain prior to the MRI she stated was 100 out of 10 in intensity, now down to 3 out of 10 in intensity with pain medications in the Emergency Room. The patient did have x-rays and CT scans at that time that showed T12 compression fracture. Hospitalist services were contacted for further evaluation.   PAST MEDICAL HISTORY:  1. Hypertension. 2. Peptic ulcer disease. 3. History of breast cancer, status post right mastectomy. 4. Hyperlipidemia. 5. Depression. 6. Anxiety. 7. Back pain with compression fracture.   PAST SURGICAL HISTORY:  1. Kyphoplasty. 2. Right-sided mastectomy. 3. Radical lymph node dissection.  4. Small skin cancer removal on her neck.  5. Cesarean section.   ALLERGIES: Amoxicillin, aspirin, Benadryl and fentanyl.   MEDICATIONS:  1. Ziac 2.5/6.25, one tablet daily.  2. Evista 60 mg daily.  3. Flector patch 1.3% topical film extended-release one patch mid back every 12 hours.  4. Lisinopril 10 mg daily.  5. Meclizine 25 mg every six hours as needed for dizziness.  6. MiraLAX 17 grams every 12 hours as needed for constipation.   7. Oxycodone 5 mg 1 to 2 tablets as needed for pain. 8. Promethazine 25 mg every four hours as needed for pain.  9. Ranitidine 150 mg twice a day.  10. Senna Plus 50, 8.6, two tablets at bedtime.  11. Zoloft 100 mg daily.  12. Zocor 40 mg at bedtime.  13. Trazodone 50 mg at bedtime.  14. Tylenol p.r.n.  15. Vitamin D3, 2000 international units daily. 16. Xanax 0.25 mg 3 times a day as needed.   SOCIAL HISTORY: Currently at rehab. No smoking. No alcohol. No drug use. Was a housewife and a Psychologist, sport and exercise.   FAMILY HISTORY: Both parents died at 42. Father of a cerebrovascular accident. Mother of colon cancer and a myocardial infarction.  REVIEW OF SYSTEMS: CONSTITUTIONAL: Positive for chills. Positive for sweats. Positive for weight loss. Positive for weakness. No fever. EYES: She does wear glasses. EARS, NOSE, MOUTH, AND THROAT: Decreased hearing, wears hearing aids. Positive for dysphagia to solids. CARDIOVASCULAR: No chest pain. No palpitations. RESPIRATORY: Positive for shortness of breath with exertion. GASTROINTESTINAL: Positive for nausea. No vomiting. Positive for abdominal pain that wraps around from the back. Positive for constipation. No bright red blood per rectum. No melena. GENITOURINARY: Occasional burning. MUSCULOSKELETAL: No joint pain or muscle pain. INTEGUMENT: No rashes or eruptions.  NEUROLOGIC: No fainting or blackouts. PSYCHIATRIC: Positive for anxiety and depression.  ENDOCRINE: No thyroid problems. HEMATOLOGICAL/LYMPHATIC: No anemia. No easy bruising or bleeding.   PHYSICAL EXAMINATION:  VITAL SIGNS: Temperature 97.5, pulse 70, respirations 20, blood pressure 155/78, pulse oximetry 95% on room air.   GENERAL: No respiratory distress.   EYES: Conjunctivae and lids normal. Pupils equal, round, and reactive to light. Extraocular muscles intact. No nystagmus.   EARS, NOSE, MOUTH, AND THROAT: Tympanic membrane, no erythema. Nasal mucosa, no erythema. Throat, no erythema. No  exudate seen. Lips and gums, no lesions.   NECK: No JVD. No bruits. No lymphadenopathy. No thyromegaly. No thyroid nodules palpated.   LUNGS: Lungs are clear to auscultation. No use of accessory muscles to breathe. No rhonchi, rales, or wheeze heard.   CARDIOVASCULAR: S1, S2 normal. +6/5 systolic ejection murmur. Carotid upstroke 2+ bilaterally. No bruits.   EXTREMITIES: Dorsalis pedis pulses 2+ bilaterally. No edema of the lower extremity.   ABDOMEN: Soft. Slight tenderness right abdomen greater than left abdomen even to light touch. No organomegaly/splenomegaly. Normoactive bowel sounds. No masses felt.   LYMPHATIC: No lymph nodes in the neck.   MUSCULOSKELETAL: No clubbing, edema, or cyanosis.   SKIN: No ulcers or lesions.   NEUROLOGIC: Cranial nerves II through XII grossly intact. Deep tendon reflexes 1+ bilateral lower extremity. The patient is able to straight leg raise bilaterally. Pain with palpating over the thoracic spine.   PSYCHIATRIC: The patient is oriented to person, place, and time.   LABORATORY, DIAGNOSTIC, AND RADIOLOGICAL DATA: Glucose 111, BUN 8, creatinine 0.83, sodium 139, potassium 3.1, chloride 102, CO2 26, calcium 9.2, alkaline phosphatase 171, ALT 15, AST 14, albumin 3.9. White blood cell count 4.8, hemoglobin and hematocrit 14.2 and 42.8, platelet count 211. Urine culture: 1+ leukocyte esterase, trace ketones.   ASSESSMENT AND PLAN:  1. Back pain, unrelenting, going on for three weeks with compression fracture seen on last CT scan. We will admit as an observation. Most likely the patient will need to be converted to admission. Pain control with IV and p.o. medications. Will get an MRI of the back. Will get an orthopedic consult.  2. Urinary tract infection. Will give p.o. Cipro. Send off a urine culture.  3. Hypokalemia. Will replace potassium orally.  4. Hypertension. Blood pressure currently stable on current medications.  5. Hyperlipidemia. Continue  simvastatin.  6. Anxiety, depression. On Zoloft and Xanax. 7. Constipation, on MiraLAX and Senna.  8. Osteoporosis, on Evista.   The patient wishes to be a DO NOT RESUSCITATE.   TIME SPENT ON ADMISSION: 50 minutes.   ____________________________ Tana Conch. Leslye Peer, MD rjw:ap D: 02/01/2012 16:44:00 ET T: 02/01/2012 17:05:49 ET JOB#: [id number]  cc: Tana Conch. Leslye Peer, MD, <Dictator> Youlanda Roys. Lovie Macadamia, MD Marisue Brooklyn MD ELECTRONICALLY SIGNED 02/01/2012 21:27

## 2015-02-12 ENCOUNTER — Ambulatory Visit: Payer: Medicare Other | Admitting: Internal Medicine

## 2015-12-12 DIAGNOSIS — Z13220 Encounter for screening for lipoid disorders: Secondary | ICD-10-CM | POA: Diagnosis not present

## 2015-12-12 DIAGNOSIS — E538 Deficiency of other specified B group vitamins: Secondary | ICD-10-CM | POA: Diagnosis not present

## 2015-12-12 DIAGNOSIS — Z79899 Other long term (current) drug therapy: Secondary | ICD-10-CM | POA: Diagnosis not present

## 2015-12-12 DIAGNOSIS — F329 Major depressive disorder, single episode, unspecified: Secondary | ICD-10-CM | POA: Diagnosis not present

## 2015-12-12 DIAGNOSIS — F419 Anxiety disorder, unspecified: Secondary | ICD-10-CM | POA: Diagnosis not present

## 2015-12-12 DIAGNOSIS — M545 Low back pain: Secondary | ICD-10-CM | POA: Diagnosis not present

## 2015-12-12 DIAGNOSIS — G8929 Other chronic pain: Secondary | ICD-10-CM | POA: Diagnosis not present

## 2015-12-13 DIAGNOSIS — Z79899 Other long term (current) drug therapy: Secondary | ICD-10-CM | POA: Diagnosis not present

## 2015-12-26 DIAGNOSIS — H2513 Age-related nuclear cataract, bilateral: Secondary | ICD-10-CM | POA: Diagnosis not present

## 2016-01-09 ENCOUNTER — Other Ambulatory Visit: Payer: Self-pay | Admitting: Family Medicine

## 2016-01-09 ENCOUNTER — Other Ambulatory Visit: Payer: Self-pay | Admitting: Adult Health

## 2016-01-09 DIAGNOSIS — Z1231 Encounter for screening mammogram for malignant neoplasm of breast: Secondary | ICD-10-CM

## 2016-01-17 ENCOUNTER — Ambulatory Visit
Admission: RE | Admit: 2016-01-17 | Discharge: 2016-01-17 | Disposition: A | Discharge status: Home / Self Care | Payer: PPO | Source: Ambulatory Visit | Attending: Adult Health | Admitting: Adult Health

## 2016-01-17 ENCOUNTER — Other Ambulatory Visit: Payer: Self-pay | Admitting: Adult Health

## 2016-01-17 DIAGNOSIS — Z1231 Encounter for screening mammogram for malignant neoplasm of breast: Secondary | ICD-10-CM | POA: Insufficient documentation

## 2016-01-17 DIAGNOSIS — Z9011 Acquired absence of right breast and nipple: Secondary | ICD-10-CM | POA: Diagnosis not present

## 2016-01-17 HISTORY — DX: Non-Hodgkin lymphoma, unspecified, unspecified site: C85.90

## 2016-02-25 DIAGNOSIS — Z85828 Personal history of other malignant neoplasm of skin: Secondary | ICD-10-CM | POA: Diagnosis not present

## 2016-02-25 DIAGNOSIS — L821 Other seborrheic keratosis: Secondary | ICD-10-CM | POA: Diagnosis not present

## 2016-02-25 DIAGNOSIS — L538 Other specified erythematous conditions: Secondary | ICD-10-CM | POA: Diagnosis not present

## 2016-02-25 DIAGNOSIS — X32XXXA Exposure to sunlight, initial encounter: Secondary | ICD-10-CM | POA: Diagnosis not present

## 2016-02-25 DIAGNOSIS — L820 Inflamed seborrheic keratosis: Secondary | ICD-10-CM | POA: Diagnosis not present

## 2016-02-25 DIAGNOSIS — L570 Actinic keratosis: Secondary | ICD-10-CM | POA: Diagnosis not present

## 2016-02-25 DIAGNOSIS — Z08 Encounter for follow-up examination after completed treatment for malignant neoplasm: Secondary | ICD-10-CM | POA: Diagnosis not present

## 2016-06-16 DIAGNOSIS — H8113 Benign paroxysmal vertigo, bilateral: Secondary | ICD-10-CM | POA: Diagnosis not present

## 2016-06-16 DIAGNOSIS — R399 Unspecified symptoms and signs involving the genitourinary system: Secondary | ICD-10-CM | POA: Diagnosis not present

## 2016-06-16 DIAGNOSIS — Z79899 Other long term (current) drug therapy: Secondary | ICD-10-CM | POA: Diagnosis not present

## 2016-06-16 DIAGNOSIS — M810 Age-related osteoporosis without current pathological fracture: Secondary | ICD-10-CM | POA: Diagnosis not present

## 2016-06-16 DIAGNOSIS — E782 Mixed hyperlipidemia: Secondary | ICD-10-CM | POA: Diagnosis not present

## 2016-06-16 DIAGNOSIS — G4700 Insomnia, unspecified: Secondary | ICD-10-CM | POA: Diagnosis not present

## 2016-06-16 DIAGNOSIS — E538 Deficiency of other specified B group vitamins: Secondary | ICD-10-CM | POA: Diagnosis not present

## 2016-06-16 DIAGNOSIS — I10 Essential (primary) hypertension: Secondary | ICD-10-CM | POA: Diagnosis not present

## 2016-06-16 DIAGNOSIS — F419 Anxiety disorder, unspecified: Secondary | ICD-10-CM | POA: Diagnosis not present

## 2016-06-16 DIAGNOSIS — M545 Low back pain: Secondary | ICD-10-CM | POA: Diagnosis not present

## 2016-06-16 DIAGNOSIS — F329 Major depressive disorder, single episode, unspecified: Secondary | ICD-10-CM | POA: Diagnosis not present

## 2016-06-16 DIAGNOSIS — K219 Gastro-esophageal reflux disease without esophagitis: Secondary | ICD-10-CM | POA: Diagnosis not present

## 2016-06-17 DIAGNOSIS — R399 Unspecified symptoms and signs involving the genitourinary system: Secondary | ICD-10-CM | POA: Diagnosis not present

## 2016-07-06 DIAGNOSIS — M5441 Lumbago with sciatica, right side: Secondary | ICD-10-CM | POA: Diagnosis not present

## 2016-12-07 ENCOUNTER — Other Ambulatory Visit: Payer: Self-pay | Admitting: Family Medicine

## 2016-12-07 DIAGNOSIS — Z1231 Encounter for screening mammogram for malignant neoplasm of breast: Secondary | ICD-10-CM

## 2016-12-15 DIAGNOSIS — M810 Age-related osteoporosis without current pathological fracture: Secondary | ICD-10-CM | POA: Diagnosis not present

## 2016-12-25 DIAGNOSIS — H2511 Age-related nuclear cataract, right eye: Secondary | ICD-10-CM | POA: Diagnosis not present

## 2016-12-28 DIAGNOSIS — Z0000 Encounter for general adult medical examination without abnormal findings: Secondary | ICD-10-CM | POA: Diagnosis not present

## 2016-12-28 DIAGNOSIS — F329 Major depressive disorder, single episode, unspecified: Secondary | ICD-10-CM | POA: Diagnosis not present

## 2016-12-28 DIAGNOSIS — I10 Essential (primary) hypertension: Secondary | ICD-10-CM | POA: Diagnosis not present

## 2016-12-28 DIAGNOSIS — M810 Age-related osteoporosis without current pathological fracture: Secondary | ICD-10-CM | POA: Diagnosis not present

## 2016-12-28 DIAGNOSIS — E782 Mixed hyperlipidemia: Secondary | ICD-10-CM | POA: Diagnosis not present

## 2017-01-14 DIAGNOSIS — M810 Age-related osteoporosis without current pathological fracture: Secondary | ICD-10-CM | POA: Diagnosis not present

## 2017-01-18 ENCOUNTER — Ambulatory Visit
Admission: RE | Admit: 2017-01-18 | Discharge: 2017-01-18 | Disposition: A | Discharge status: Home / Self Care | Payer: PPO | Source: Ambulatory Visit | Attending: Family Medicine | Admitting: Family Medicine

## 2017-01-18 DIAGNOSIS — Z1231 Encounter for screening mammogram for malignant neoplasm of breast: Secondary | ICD-10-CM

## 2017-01-18 DIAGNOSIS — Z9011 Acquired absence of right breast and nipple: Secondary | ICD-10-CM | POA: Diagnosis not present

## 2017-01-18 DIAGNOSIS — H2512 Age-related nuclear cataract, left eye: Secondary | ICD-10-CM | POA: Diagnosis not present

## 2017-01-19 ENCOUNTER — Encounter: Payer: Self-pay | Admitting: *Deleted

## 2017-01-26 ENCOUNTER — Ambulatory Visit
Admission: RE | Admit: 2017-01-26 | Discharge: 2017-01-26 | Disposition: A | Discharge status: Home / Self Care | Payer: PPO | Source: Ambulatory Visit | Attending: Ophthalmology | Admitting: Ophthalmology

## 2017-01-26 ENCOUNTER — Encounter: Payer: Self-pay | Admitting: *Deleted

## 2017-01-26 ENCOUNTER — Ambulatory Visit: Payer: PPO | Admitting: Registered Nurse

## 2017-01-26 ENCOUNTER — Encounter
Admission: RE | Disposition: A | Discharge status: Home / Self Care | Payer: Self-pay | Source: Ambulatory Visit | Attending: Ophthalmology

## 2017-01-26 DIAGNOSIS — I10 Essential (primary) hypertension: Secondary | ICD-10-CM | POA: Diagnosis not present

## 2017-01-26 DIAGNOSIS — F418 Other specified anxiety disorders: Secondary | ICD-10-CM | POA: Diagnosis not present

## 2017-01-26 DIAGNOSIS — H2512 Age-related nuclear cataract, left eye: Secondary | ICD-10-CM | POA: Diagnosis not present

## 2017-01-26 DIAGNOSIS — Z79899 Other long term (current) drug therapy: Secondary | ICD-10-CM | POA: Diagnosis not present

## 2017-01-26 DIAGNOSIS — Z7983 Long term (current) use of bisphosphonates: Secondary | ICD-10-CM | POA: Insufficient documentation

## 2017-01-26 DIAGNOSIS — K219 Gastro-esophageal reflux disease without esophagitis: Secondary | ICD-10-CM | POA: Insufficient documentation

## 2017-01-26 DIAGNOSIS — Z79891 Long term (current) use of opiate analgesic: Secondary | ICD-10-CM | POA: Insufficient documentation

## 2017-01-26 DIAGNOSIS — F419 Anxiety disorder, unspecified: Secondary | ICD-10-CM | POA: Diagnosis not present

## 2017-01-26 DIAGNOSIS — F329 Major depressive disorder, single episode, unspecified: Secondary | ICD-10-CM | POA: Insufficient documentation

## 2017-01-26 HISTORY — DX: Other specified postprocedural states: Z98.890

## 2017-01-26 HISTORY — DX: Dyspnea, unspecified: R06.00

## 2017-01-26 HISTORY — DX: Pain, unspecified: R52

## 2017-01-26 HISTORY — DX: Tremor, unspecified: R25.1

## 2017-01-26 HISTORY — DX: Other complications of anesthesia, initial encounter: T88.59XA

## 2017-01-26 HISTORY — DX: Malignant neoplasm of unspecified site of unspecified female breast: C50.919

## 2017-01-26 HISTORY — DX: Nausea with vomiting, unspecified: R11.2

## 2017-01-26 HISTORY — PX: CATARACT EXTRACTION W/PHACO: SHX586

## 2017-01-26 HISTORY — DX: Adverse effect of unspecified anesthetic, initial encounter: T41.45XA

## 2017-01-26 SURGERY — PHACOEMULSIFICATION, CATARACT, WITH IOL INSERTION
Anesthesia: Monitor Anesthesia Care | Site: Eye | Laterality: Left | Wound class: Clean

## 2017-01-26 MED ORDER — CARBACHOL 0.01 % IO SOLN
INTRAOCULAR | Status: DC | PRN
  Administered 2017-01-26: 0.5 mL via INTRAOCULAR

## 2017-01-26 MED ORDER — ONDANSETRON HCL 4 MG/2ML IJ SOLN
INTRAMUSCULAR | Status: DC | PRN
  Administered 2017-01-26: 4 mg via INTRAVENOUS

## 2017-01-26 MED ORDER — NA CHONDROIT SULF-NA HYALURON 40-17 MG/ML IO SOLN
INTRAOCULAR | Status: AC
  Filled 2017-01-26: qty 1

## 2017-01-26 MED ORDER — MIDAZOLAM HCL 2 MG/2ML IJ SOLN
INTRAMUSCULAR | Status: AC
  Filled 2017-01-26: qty 2

## 2017-01-26 MED ORDER — ARMC OPHTHALMIC DILATING DROPS
1.0000 "application " | OPHTHALMIC | Status: AC
  Administered 2017-01-26 (×2): 1 via OPHTHALMIC

## 2017-01-26 MED ORDER — FENTANYL CITRATE (PF) 100 MCG/2ML IJ SOLN
INTRAMUSCULAR | Status: AC
  Filled 2017-01-26: qty 2

## 2017-01-26 MED ORDER — FENTANYL CITRATE (PF) 100 MCG/2ML IJ SOLN
INTRAMUSCULAR | Status: DC | PRN
  Administered 2017-01-26: 50 ug via INTRAVENOUS

## 2017-01-26 MED ORDER — MOXIFLOXACIN HCL 0.5 % OP SOLN: 1.0000 [drp] | OPHTHALMIC | Status: DC | PRN

## 2017-01-26 MED ORDER — BSS IO SOLN
INTRAOCULAR | Status: DC | PRN
  Administered 2017-01-26: 4 mL via OPHTHALMIC

## 2017-01-26 MED ORDER — MIDAZOLAM HCL 2 MG/2ML IJ SOLN
INTRAMUSCULAR | Status: DC | PRN
  Administered 2017-01-26: 1 mg via INTRAVENOUS

## 2017-01-26 MED ORDER — ARMC OPHTHALMIC DILATING DROPS
OPHTHALMIC | Status: AC
  Administered 2017-01-26: 10:00:00
  Filled 2017-01-26: qty 0.4

## 2017-01-26 MED ORDER — SODIUM CHLORIDE 0.9 % IV SOLN
INTRAVENOUS | Status: DC
  Administered 2017-01-26: 10:00:00 via INTRAVENOUS

## 2017-01-26 MED ORDER — MOXIFLOXACIN HCL 0.5 % OP SOLN
OPHTHALMIC | Status: AC
  Filled 2017-01-26: qty 3

## 2017-01-26 MED ORDER — ONDANSETRON HCL 4 MG/2ML IJ SOLN
INTRAMUSCULAR | Status: AC
  Filled 2017-01-26: qty 2

## 2017-01-26 MED ORDER — POVIDONE-IODINE 10 % OINT PACKET
TOPICAL_OINTMENT | CUTANEOUS | Status: AC
  Filled 2017-01-26: qty 1

## 2017-01-26 MED ORDER — EPINEPHRINE PF 1 MG/ML IJ SOLN
INTRAMUSCULAR | Status: AC
  Filled 2017-01-26: qty 2

## 2017-01-26 MED ORDER — MOXIFLOXACIN HCL 0.5 % OP SOLN
OPHTHALMIC | Status: DC | PRN
  Administered 2017-01-26: 0.2 mL via OPHTHALMIC

## 2017-01-26 MED ORDER — EPINEPHRINE PF 1 MG/ML IJ SOLN
INTRAOCULAR | Status: DC | PRN
  Administered 2017-01-26: 10:00:00 via OPHTHALMIC

## 2017-01-26 MED ORDER — NA CHONDROIT SULF-NA HYALURON 40-17 MG/ML IO SOLN
INTRAOCULAR | Status: DC | PRN
  Administered 2017-01-26: 1 mL via INTRAOCULAR

## 2017-01-26 MED ORDER — POVIDONE-IODINE 5 % OP SOLN
OPHTHALMIC | Status: DC | PRN
  Administered 2017-01-26: 1 via OPHTHALMIC

## 2017-01-26 SURGICAL SUPPLY — 22 items
CANNULA ANT/CHMB 27G (MISCELLANEOUS) ×1 IMPLANT
CANNULA ANT/CHMB 27GA (MISCELLANEOUS) ×3 IMPLANT
CUP MEDICINE 2OZ PLAST GRAD ST (MISCELLANEOUS) ×3 IMPLANT
GLOVE BIO SURGEON STRL SZ8 (GLOVE) ×3 IMPLANT
GLOVE BIOGEL M 6.5 STRL (GLOVE) ×3 IMPLANT
GLOVE SURG LX 8.0 MICRO (GLOVE) ×2
GLOVE SURG LX STRL 8.0 MICRO (GLOVE) ×1 IMPLANT
GOWN STRL REUS W/ TWL LRG LVL3 (GOWN DISPOSABLE) ×2 IMPLANT
GOWN STRL REUS W/TWL LRG LVL3 (GOWN DISPOSABLE) ×6
LENS IOL TECNIS ITEC 24.5 (Intraocular Lens) ×2 IMPLANT
PACK CATARACT (MISCELLANEOUS) ×3 IMPLANT
PACK CATARACT BRASINGTON LX (MISCELLANEOUS) ×3 IMPLANT
PACK EYE AFTER SURG (MISCELLANEOUS) ×3 IMPLANT
SOL BSS BAG (MISCELLANEOUS) ×3
SOL PREP PVP 2OZ (MISCELLANEOUS) ×3
SOLUTION BSS BAG (MISCELLANEOUS) ×1 IMPLANT
SOLUTION PREP PVP 2OZ (MISCELLANEOUS) ×1 IMPLANT
SYR 3ML LL SCALE MARK (SYRINGE) ×3 IMPLANT
SYR 5ML LL (SYRINGE) ×3 IMPLANT
SYR TB 1ML 27GX1/2 LL (SYRINGE) ×3 IMPLANT
WATER STERILE IRR 250ML POUR (IV SOLUTION) ×3 IMPLANT
WIPE NON LINTING 3.25X3.25 (MISCELLANEOUS) ×3 IMPLANT

## 2017-01-26 NOTE — Discharge Instructions (Signed)
Eye Surgery Discharge Instructions  Expect mild scratchy sensation or mild soreness. DO NOT RUB YOUR EYE!  The day of surgery:  Minimal physical activity, but bed rest is not required  No reading, computer work, or close hand work  No bending, lifting, or straining.  May watch TV  For 24 hours:  No driving, legal decisions, or alcoholic beverages  Safety precautions  Eat anything you prefer: It is better to start with liquids, then soup then solid foods.  _____ Eye patch should be worn until postoperative exam tomorrow.  ____ Solar shield eyeglasses should be worn for comfort in the sunlight/patch while sleeping  Resume all regular medications including aspirin or Coumadin if these were discontinued prior to surgery. You may shower, bathe, shave, or wash your hair. Tylenol may be taken for mild discomfort.  Call your doctor if you experience significant pain, nausea, or vomiting, fever > 101 or other signs of infection. [phone] or [phone] Specific instructions:  Follow-up Information    PORFILIO,WILLIAM LOUIS, MD Follow up.   Specialty:  Ophthalmology Why:  01/27/17 at 9:40 Contact information: [address] [place] [address] [phone]          Eye Surgery Discharge Instructions  Expect mild scratchy sensation or mild soreness. DO NOT RUB YOUR EYE!  The day of surgery:  Minimal physical activity, but bed rest is not required  No reading, computer work, or close hand work  No bending, lifting, or straining.  May watch TV  For 24 hours:  No driving, legal decisions, or alcoholic beverages  Safety precautions  Eat anything you prefer: It is better to start with liquids, then soup then solid foods.  _____ Eye patch should be worn until postoperative exam tomorrow.  ____ Solar shield eyeglasses should be worn for comfort in the sunlight/patch while sleeping  Resume all regular medications including aspirin or Coumadin if these  were discontinued prior to surgery. You may shower, bathe, shave, or wash your hair. Tylenol may be taken for mild discomfort.  Call your doctor if you experience significant pain, nausea, or vomiting, fever > 101 or other signs of infection. [phone] or [phone] Specific instructions:  Follow-up Information    PORFILIO,WILLIAM LOUIS, MD Follow up.   Specialty:  Ophthalmology Why:  01/27/17 at 9:40 Contact information: [place] [address] [phone]

## 2017-01-26 NOTE — Anesthesia Procedure Notes (Signed)
Procedure Name: MAC Date/Time: 01/26/2017 10:25 AM Performed by: Hedda Slade Pre-anesthesia Checklist: Patient identified, Emergency Drugs available, Suction available and Patient being monitored Patient Re-evaluated:Patient Re-evaluated prior to inductionOxygen Delivery Method: Nasal cannula

## 2017-01-26 NOTE — Anesthesia Post-op Follow-up Note (Cosign Needed)
Anesthesia QCDR form completed.        

## 2017-01-26 NOTE — Anesthesia Preprocedure Evaluation (Signed)
Anesthesia Evaluation  Patient identified by MRN, date of birth, ID band Patient awake    Reviewed: Allergy & Precautions, NPO status   History of Anesthesia Complications (+) PONV  Airway Mallampati: III       Dental  (+) Teeth Intact   Pulmonary shortness of breath,    breath sounds clear to auscultation       Cardiovascular Exercise Tolerance: Good hypertension, Pt. on medications  Rhythm:Regular     Neuro/Psych  Headaches, Anxiety Depression    GI/Hepatic Neg liver ROS, GERD  ,  Endo/Other  negative endocrine ROS  Renal/GU negative Renal ROS     Musculoskeletal   Abdominal Normal abdominal exam  (+)   Peds negative pediatric ROS (+)  Hematology negative hematology ROS (+)   Anesthesia Other Findings   Reproductive/Obstetrics                             Anesthesia Physical Anesthesia Plan  ASA: III  Anesthesia Plan: MAC   Post-op Pain Management:    Induction: Intravenous  Airway Management Planned: Natural Airway and Nasal Cannula  Additional Equipment:   Intra-op Plan:   Post-operative Plan:   Informed Consent: I have reviewed the patients History and Physical, chart, labs and discussed the procedure including the risks, benefits and alternatives for the proposed anesthesia with the patient or authorized representative who has indicated his/her understanding and acceptance.     Plan Discussed with: CRNA  Anesthesia Plan Comments:         Anesthesia Quick Evaluation

## 2017-01-26 NOTE — OR Nursing (Signed)
Pt states she is allergic to a pain patch that makes her hallucinate- family will research the name and report it to MD at next visit

## 2017-01-26 NOTE — Anesthesia Postprocedure Evaluation (Signed)
Anesthesia Post Note  Patient: Zadia Uhde  Procedure(s) Performed: Procedure(s) (LRB): CATARACT EXTRACTION PHACO AND INTRAOCULAR LENS PLACEMENT (IOC) (Left)  Patient location during evaluation: PACU Anesthesia Type: MAC Level of consciousness: awake, awake and alert and oriented Pain management: pain level controlled Vital Signs Assessment: post-procedure vital signs reviewed and stable Respiratory status: spontaneous breathing Cardiovascular status: blood pressure returned to baseline Postop Assessment: no signs of nausea or vomiting Anesthetic complications: no     Last Vitals:  Vitals:   01/26/17 0919 01/26/17 1045  BP: (!) 183/75 125/64  Pulse: (!) 57 (!) 59  Resp: 18   Temp: 36.4 C 36.3 C    Last Pain:  Vitals:   01/26/17 1045  TempSrc: Temporal  PainSc:                  Hedda Slade

## 2017-01-26 NOTE — Transfer of Care (Signed)
Immediate Anesthesia Transfer of Care Note  Patient: Lindsey Barnes  Procedure(s) Performed: Procedure(s) with comments: CATARACT EXTRACTION PHACO AND INTRAOCULAR LENS PLACEMENT (IOC) (Left) - [place] 53.7 AP% 20.3 CDE 10.90 Fluid pack lot # [id number]  Patient Location: PACU  Anesthesia Type:MAC  Level of Consciousness: awake, alert  and oriented  Airway & Oxygen Therapy: Patient Spontanous Breathing  Post-op Assessment: Report given to RN and Post -op Vital signs reviewed and stable  Post vital signs: Reviewed and stable  Last Vitals:  Vitals:   01/26/17 0919 01/26/17 1045  BP: (!) 183/75 125/64  Pulse: (!) 57 (!) 59  Resp: 18   Temp: 36.4 C 36.3 C    Last Pain:  Vitals:   01/26/17 1045  TempSrc: Temporal  PainSc:          Complications: No apparent anesthesia complications

## 2017-01-26 NOTE — H&P (Signed)
All labs reviewed. Abnormal studies sent to patients PCP when indicated.  Previous H&P reviewed, patient examined, there are NO CHANGES.  Lindsey Kithcart LOUIS4/17/201810:16 AM

## 2017-01-26 NOTE — Op Note (Signed)
PREOPERATIVE DIAGNOSIS:  Nuclear sclerotic cataract of the left eye.   POSTOPERATIVE DIAGNOSIS:  Nuclear sclerotic cataract of the left eye.   OPERATIVE PROCEDURE: Procedure(s): CATARACT EXTRACTION PHACO AND INTRAOCULAR LENS PLACEMENT (IOC)   SURGEON:  Birder Robson, MD.   ANESTHESIA:  Anesthesiologist: Gijsbertus F Boston Service, MD CRNA: Hedda Slade, CRNA  1.      Managed anesthesia care. 2.     0.87ml of Shugarcaine was instilled following the paracentesis   COMPLICATIONS:  None.   TECHNIQUE:   Stop and chop   DESCRIPTION OF PROCEDURE:  The patient was examined and consented in the preoperative holding area where the aforementioned topical anesthesia was applied to the left eye and then brought back to the Operating Room where the left eye was prepped and draped in the usual sterile ophthalmic fashion and a lid speculum was placed. A paracentesis was created with the side port blade and the anterior chamber was filled with viscoelastic. A near clear corneal incision was performed with the steel keratome. A continuous curvilinear capsulorrhexis was performed with a cystotome followed by the capsulorrhexis forceps. Hydrodissection and hydrodelineation were carried out with BSS on a blunt cannula. The lens was removed in a stop and chop  technique and the remaining cortical material was removed with the irrigation-aspiration handpiece. The capsular bag was inflated with viscoelastic and the Technis [id number] lens was placed in the capsular bag without complication. The remaining viscoelastic was removed from the eye with the irrigation-aspiration handpiece. The wounds were hydrated. The anterior chamber was flushed with Miostat and the eye was inflated to physiologic pressure. 0.44ml Vigamox was placed in the anterior chamber. The wounds were found to be water tight. The eye was dressed with Vigamox. The patient was given protective glasses to wear throughout the day and a shield with which to sleep  tonight. The patient was also given drops with which to begin a drop regimen today and will follow-up with me in one day.  Implant Name Type Inv. Item Serial No. Manufacturer Lot No. LRB No. Used  LENS IOL DIOP 24.5 - [id number] Intraocular Lens LENS IOL DIOP 24.5 [id number] AMO  Left 1  LENS IOL DIOP 24.5 - [id number] Intraocular Lens LENS IOL DIOP 24.5 [id number] AMO   Left 1    Procedure(s) with comments: CATARACT EXTRACTION PHACO AND INTRAOCULAR LENS PLACEMENT (IOC) (Left) - [place] 53.7 AP% 20.3 CDE 10.90 Fluid pack lot # [id number]  Electronically signed: [place] 01/26/2017 10:44 AM

## 2017-02-09 DIAGNOSIS — H2511 Age-related nuclear cataract, right eye: Secondary | ICD-10-CM | POA: Diagnosis not present

## 2017-02-10 ENCOUNTER — Encounter: Payer: Self-pay | Admitting: *Deleted

## 2017-02-16 ENCOUNTER — Encounter
Admission: RE | Disposition: A | Discharge status: Home / Self Care | Payer: Self-pay | Source: Ambulatory Visit | Attending: Ophthalmology

## 2017-02-16 ENCOUNTER — Ambulatory Visit
Admission: RE | Admit: 2017-02-16 | Discharge: 2017-02-16 | Disposition: A | Discharge status: Home / Self Care | Payer: PPO | Source: Ambulatory Visit | Attending: Ophthalmology | Admitting: Ophthalmology

## 2017-02-16 ENCOUNTER — Ambulatory Visit: Payer: PPO | Admitting: Anesthesiology

## 2017-02-16 ENCOUNTER — Encounter: Payer: Self-pay | Admitting: *Deleted

## 2017-02-16 DIAGNOSIS — H2511 Age-related nuclear cataract, right eye: Secondary | ICD-10-CM | POA: Diagnosis not present

## 2017-02-16 DIAGNOSIS — K219 Gastro-esophageal reflux disease without esophagitis: Secondary | ICD-10-CM | POA: Insufficient documentation

## 2017-02-16 DIAGNOSIS — F329 Major depressive disorder, single episode, unspecified: Secondary | ICD-10-CM | POA: Insufficient documentation

## 2017-02-16 DIAGNOSIS — R51 Headache: Secondary | ICD-10-CM | POA: Diagnosis not present

## 2017-02-16 DIAGNOSIS — F419 Anxiety disorder, unspecified: Secondary | ICD-10-CM | POA: Diagnosis not present

## 2017-02-16 DIAGNOSIS — Z79899 Other long term (current) drug therapy: Secondary | ICD-10-CM | POA: Diagnosis not present

## 2017-02-16 DIAGNOSIS — I10 Essential (primary) hypertension: Secondary | ICD-10-CM | POA: Insufficient documentation

## 2017-02-16 DIAGNOSIS — F418 Other specified anxiety disorders: Secondary | ICD-10-CM | POA: Diagnosis not present

## 2017-02-16 HISTORY — DX: Unspecified hearing loss, unspecified ear: H91.90

## 2017-02-16 HISTORY — PX: CATARACT EXTRACTION W/PHACO: SHX586

## 2017-02-16 SURGERY — PHACOEMULSIFICATION, CATARACT, WITH IOL INSERTION
Anesthesia: Monitor Anesthesia Care | Site: Eye | Laterality: Right | Wound class: Clean

## 2017-02-16 MED ORDER — MOXIFLOXACIN HCL 0.5 % OP SOLN
OPHTHALMIC | Status: DC | PRN
  Administered 2017-02-16: .2 mL via OPHTHALMIC

## 2017-02-16 MED ORDER — SODIUM CHLORIDE 0.9 % IV SOLN
INTRAVENOUS | Status: DC
  Administered 2017-02-16 (×2): via INTRAVENOUS

## 2017-02-16 MED ORDER — FENTANYL CITRATE (PF) 100 MCG/2ML IJ SOLN: 25.0000 ug | INTRAMUSCULAR | Status: DC | PRN

## 2017-02-16 MED ORDER — ONDANSETRON HCL 4 MG/2ML IJ SOLN
INTRAMUSCULAR | Status: DC | PRN
  Administered 2017-02-16: 4 mg via INTRAVENOUS

## 2017-02-16 MED ORDER — CARBACHOL 0.01 % IO SOLN
INTRAOCULAR | Status: DC | PRN
  Administered 2017-02-16: .5 mL via INTRAOCULAR

## 2017-02-16 MED ORDER — LIDOCAINE HCL (PF) 2 % IJ SOLN
INTRAMUSCULAR | Status: AC
  Filled 2017-02-16: qty 2

## 2017-02-16 MED ORDER — ARMC OPHTHALMIC DILATING DROPS
OPHTHALMIC | Status: AC
  Administered 2017-02-16 (×3)
  Filled 2017-02-16: qty 0.4

## 2017-02-16 MED ORDER — POVIDONE-IODINE 5 % OP SOLN
OPHTHALMIC | Status: AC
Start: 2017-02-16 — End: 2017-02-16
  Filled 2017-02-16: qty 30

## 2017-02-16 MED ORDER — MIDAZOLAM HCL 2 MG/2ML IJ SOLN
INTRAMUSCULAR | Status: AC
  Filled 2017-02-16: qty 2

## 2017-02-16 MED ORDER — EPINEPHRINE PF 1 MG/ML IJ SOLN
INTRAMUSCULAR | Status: AC
  Filled 2017-02-16: qty 2

## 2017-02-16 MED ORDER — NA CHONDROIT SULF-NA HYALURON 40-17 MG/ML IO SOLN
INTRAOCULAR | Status: DC | PRN
  Administered 2017-02-16: 1 mL via INTRAOCULAR

## 2017-02-16 MED ORDER — ONDANSETRON HCL 4 MG/2ML IJ SOLN
INTRAMUSCULAR | Status: AC
  Filled 2017-02-16: qty 2

## 2017-02-16 MED ORDER — MIDAZOLAM HCL 5 MG/5ML IJ SOLN
INTRAMUSCULAR | Status: DC | PRN
  Administered 2017-02-16 (×2): 0.5 mg via INTRAVENOUS

## 2017-02-16 MED ORDER — MOXIFLOXACIN HCL 0.5 % OP SOLN
OPHTHALMIC | Status: AC
  Filled 2017-02-16: qty 3

## 2017-02-16 MED ORDER — NA CHONDROIT SULF-NA HYALURON 40-17 MG/ML IO SOLN
INTRAOCULAR | Status: AC
  Filled 2017-02-16: qty 1

## 2017-02-16 MED ORDER — ONDANSETRON HCL 4 MG/2ML IJ SOLN: 4.0000 mg | Freq: Once | INTRAMUSCULAR | Status: DC | PRN

## 2017-02-16 MED ORDER — EPINEPHRINE PF 1 MG/ML IJ SOLN
INTRAMUSCULAR | Status: DC | PRN
  Administered 2017-02-16: 1 mL via OPHTHALMIC

## 2017-02-16 MED ORDER — LIDOCAINE HCL (PF) 4 % IJ SOLN
INTRAOCULAR | Status: DC | PRN
  Administered 2017-02-16: 2 mL via OPHTHALMIC

## 2017-02-16 SURGICAL SUPPLY — 22 items
CANNULA ANT/CHMB 27G (MISCELLANEOUS) ×1 IMPLANT
CANNULA ANT/CHMB 27GA (MISCELLANEOUS) ×3 IMPLANT
CUP MEDICINE 2OZ PLAST GRAD ST (MISCELLANEOUS) ×3 IMPLANT
GLOVE BIO SURGEON STRL SZ8 (GLOVE) ×3 IMPLANT
GLOVE BIOGEL M 6.5 STRL (GLOVE) ×3 IMPLANT
GLOVE SURG LX 8.0 MICRO (GLOVE) ×2
GLOVE SURG LX STRL 8.0 MICRO (GLOVE) ×1 IMPLANT
GOWN STRL REUS W/ TWL LRG LVL3 (GOWN DISPOSABLE) ×2 IMPLANT
GOWN STRL REUS W/TWL LRG LVL3 (GOWN DISPOSABLE) ×6
LENS IOL TECNIS ITEC 24.5 (Intraocular Lens) ×2 IMPLANT
PACK CATARACT (MISCELLANEOUS) ×3 IMPLANT
PACK CATARACT BRASINGTON LX (MISCELLANEOUS) ×3 IMPLANT
PACK EYE AFTER SURG (MISCELLANEOUS) ×3 IMPLANT
SOL BSS BAG (MISCELLANEOUS) ×3
SOL PREP PVP 2OZ (MISCELLANEOUS) ×3
SOLUTION BSS BAG (MISCELLANEOUS) ×1 IMPLANT
SOLUTION PREP PVP 2OZ (MISCELLANEOUS) ×1 IMPLANT
SYR 3ML LL SCALE MARK (SYRINGE) ×3 IMPLANT
SYR 5ML LL (SYRINGE) ×3 IMPLANT
SYR TB 1ML 27GX1/2 LL (SYRINGE) ×3 IMPLANT
WATER STERILE IRR 250ML POUR (IV SOLUTION) ×3 IMPLANT
WIPE NON LINTING 3.25X3.25 (MISCELLANEOUS) ×3 IMPLANT

## 2017-02-16 NOTE — Anesthesia Post-op Follow-up Note (Cosign Needed)
Anesthesia QCDR form completed.        

## 2017-02-16 NOTE — Anesthesia Preprocedure Evaluation (Signed)
Anesthesia Evaluation  Patient identified by MRN, date of birth, ID band Patient awake    Reviewed: Allergy & Precautions, NPO status   History of Anesthesia Complications (+) PONV  Airway Mallampati: III       Dental  (+) Teeth Intact   Pulmonary shortness of breath, pneumonia, resolved,    breath sounds clear to auscultation       Cardiovascular Exercise Tolerance: Good hypertension, Pt. on medications  Rhythm:Regular     Neuro/Psych  Headaches, PSYCHIATRIC DISORDERS Anxiety Depression    GI/Hepatic Neg liver ROS, GERD  ,  Endo/Other  negative endocrine ROS  Renal/GU negative Renal ROS     Musculoskeletal   Abdominal Normal abdominal exam  (+)   Peds negative pediatric ROS (+)  Hematology negative hematology ROS (+)   Anesthesia Other Findings   Reproductive/Obstetrics                             Anesthesia Physical  Anesthesia Plan  ASA: III  Anesthesia Plan: MAC   Post-op Pain Management:    Induction: Intravenous  Airway Management Planned: Nasal Cannula  Additional Equipment:   Intra-op Plan:   Post-operative Plan:   Informed Consent: I have reviewed the patients History and Physical, chart, labs and discussed the procedure including the risks, benefits and alternatives for the proposed anesthesia with the patient or authorized representative who has indicated his/her understanding and acceptance.     Plan Discussed with: CRNA  Anesthesia Plan Comments:         Anesthesia Quick Evaluation

## 2017-02-16 NOTE — Transfer of Care (Signed)
Immediate Anesthesia Transfer of Care Note  Patient: Lindsey Barnes  Procedure(s) Performed: Procedure(s) with comments: CATARACT EXTRACTION PHACO AND INTRAOCULAR LENS PLACEMENT (IOC) (Right) - [place] 00:55 AP% 22.5 CDE 12.43 Fluid pack lot # [id number]  Patient Location: PACU  Anesthesia Type:MAC  Level of Consciousness: awake, alert , oriented and patient cooperative  Airway & Oxygen Therapy: Patient Spontanous Breathing  Post-op Assessment: Report given to RN, Post -op Vital signs reviewed and stable and Patient moving all extremities X 4  Post vital signs: Reviewed and stable  Last Vitals:  Vitals:   02/16/17 0920 02/16/17 1035  BP: (!) 156/90 121/70  Pulse: 69 62  Resp: 16   Temp: 36.3 C 36.7 C    Last Pain:  Vitals:   02/16/17 1035  TempSrc: Tympanic  PainSc: 0-No pain         Complications: No apparent anesthesia complications

## 2017-02-16 NOTE — H&P (Signed)
All labs reviewed. Abnormal studies sent to patients PCP when indicated.  Previous H&P reviewed, patient examined, there are NO CHANGES.  Lindsey Wanat LOUIS5/8/201810:05 AM

## 2017-02-16 NOTE — Discharge Instructions (Signed)
Eye Surgery Discharge Instructions  Expect mild scratchy sensation or mild soreness. DO NOT RUB YOUR EYE!  The day of surgery:  Minimal physical activity, but bed rest is not required  No reading, computer work, or close hand work  No bending, lifting, or straining.  May watch TV  For 24 hours:  No driving, legal decisions, or alcoholic beverages  Safety precautions  Eat anything you prefer: It is better to start with liquids, then soup then solid foods.  _____ Eye patch should be worn until postoperative exam tomorrow.  ____ Solar shield eyeglasses should be worn for comfort in the sunlight/patch while sleeping  Resume all regular medications including aspirin or Coumadin if these were discontinued prior to surgery. You may shower, bathe, shave, or wash your hair. Tylenol may be taken for mild discomfort.  Call your doctor if you experience significant pain, nausea, or vomiting, fever > 101 or other signs of infection. [phone] or [phone] Specific instructions:  Follow-up Information    Birder Robson, MD Follow up.   Specialty:  Ophthalmology Why:  May 9 at 10:50am Contact information: [address] [place] [address] [phone]

## 2017-02-16 NOTE — Op Note (Signed)
PREOPERATIVE DIAGNOSIS:  Nuclear sclerotic cataract of the right eye.   POSTOPERATIVE DIAGNOSIS:  nuclear sclerotic cataract right eye   OPERATIVE PROCEDURE: Procedure(s): CATARACT EXTRACTION PHACO AND INTRAOCULAR LENS PLACEMENT (IOC)   SURGEON:  Birder Robson, MD.   ANESTHESIA:  Anesthesiologist: Alvin Critchley, MD CRNA: Silvana Newness, CRNA  1.      Managed anesthesia care. 2.      0.33ml of Shugarcaine was instilled in the eye following the paracentesis.   COMPLICATIONS:  None.   TECHNIQUE:   Stop and chop   DESCRIPTION OF PROCEDURE:  The patient was examined and consented in the preoperative holding area where the aforementioned topical anesthesia was applied to the right eye and then brought back to the Operating Room where the right eye was prepped and draped in the usual sterile ophthalmic fashion and a lid speculum was placed. A paracentesis was created with the side port blade and the anterior chamber was filled with viscoelastic. A near clear corneal incision was performed with the steel keratome. A continuous curvilinear capsulorrhexis was performed with a cystotome followed by the capsulorrhexis forceps. Hydrodissection and hydrodelineation were carried out with BSS on a blunt cannula. The lens was removed in a stop and chop  technique and the remaining cortical material was removed with the irrigation-aspiration handpiece. The capsular bag was inflated with viscoelastic and the Technis ZCB00  lens was placed in the capsular bag without complication. The remaining viscoelastic was removed from the eye with the irrigation-aspiration handpiece. The wounds were hydrated. The anterior chamber was flushed with Miostat and the eye was inflated to physiologic pressure. 0.10ml of Vigamox was placed in the anterior chamber. The wounds were found to be water tight. The eye was dressed with Vigamox. The patient was given protective glasses to wear throughout the day and a shield with which to  sleep tonight. The patient was also given drops with which to begin a drop regimen today and will follow-up with me in one day.  Implant Name Type Inv. Item Serial No. Manufacturer Lot No. LRB No. Used  LENS IOL DIOP 24.5 - [id number] Intraocular Lens LENS IOL DIOP 24.5 [id number] AMO   Right 1   Procedure(s) with comments: CATARACT EXTRACTION PHACO AND INTRAOCULAR LENS PLACEMENT (IOC) (Right) - [place] 00:55 AP% 22.5 CDE 12.43 Fluid pack lot # [id number]  Electronically signed: [place] 02/16/2017 10:32 AM

## 2017-02-16 NOTE — Anesthesia Postprocedure Evaluation (Signed)
Anesthesia Post Note  Patient: Lindsey Barnes  Procedure(s) Performed: Procedure(s) (LRB): CATARACT EXTRACTION PHACO AND INTRAOCULAR LENS PLACEMENT (IOC) (Right)  Patient location during evaluation: PACU Anesthesia Type: MAC Level of consciousness: awake and alert Pain management: pain level controlled Vital Signs Assessment: post-procedure vital signs reviewed and stable Respiratory status: spontaneous breathing, nonlabored ventilation and respiratory function stable Cardiovascular status: stable and blood pressure returned to baseline Anesthetic complications: no     Last Vitals:  Vitals:   02/16/17 0920 02/16/17 1035  BP: (!) 156/90 121/70  Pulse: 69 62  Resp: 16   Temp: 36.3 C 36.7 C    Last Pain:  Vitals:   02/16/17 1035  TempSrc: Tympanic  PainSc: 0-No pain                 Silvana Newness A

## 2017-02-24 DIAGNOSIS — Z08 Encounter for follow-up examination after completed treatment for malignant neoplasm: Secondary | ICD-10-CM | POA: Diagnosis not present

## 2017-02-24 DIAGNOSIS — L821 Other seborrheic keratosis: Secondary | ICD-10-CM | POA: Diagnosis not present

## 2017-02-24 DIAGNOSIS — X32XXXA Exposure to sunlight, initial encounter: Secondary | ICD-10-CM | POA: Diagnosis not present

## 2017-02-24 DIAGNOSIS — L570 Actinic keratosis: Secondary | ICD-10-CM | POA: Diagnosis not present

## 2017-02-24 DIAGNOSIS — Z85828 Personal history of other malignant neoplasm of skin: Secondary | ICD-10-CM | POA: Diagnosis not present

## 2017-06-25 DIAGNOSIS — J069 Acute upper respiratory infection, unspecified: Secondary | ICD-10-CM | POA: Diagnosis not present

## 2017-07-05 DIAGNOSIS — M545 Low back pain: Secondary | ICD-10-CM | POA: Diagnosis not present

## 2017-07-05 DIAGNOSIS — I10 Essential (primary) hypertension: Secondary | ICD-10-CM | POA: Diagnosis not present

## 2017-07-05 DIAGNOSIS — E782 Mixed hyperlipidemia: Secondary | ICD-10-CM | POA: Diagnosis not present

## 2017-07-05 DIAGNOSIS — M810 Age-related osteoporosis without current pathological fracture: Secondary | ICD-10-CM | POA: Diagnosis not present

## 2017-07-05 DIAGNOSIS — G8929 Other chronic pain: Secondary | ICD-10-CM | POA: Diagnosis not present

## 2017-07-05 DIAGNOSIS — F329 Major depressive disorder, single episode, unspecified: Secondary | ICD-10-CM | POA: Diagnosis not present

## 2017-09-15 DIAGNOSIS — H43813 Vitreous degeneration, bilateral: Secondary | ICD-10-CM | POA: Diagnosis not present

## 2017-09-22 DIAGNOSIS — H903 Sensorineural hearing loss, bilateral: Secondary | ICD-10-CM | POA: Diagnosis not present

## 2017-10-25 DIAGNOSIS — D485 Neoplasm of uncertain behavior of skin: Secondary | ICD-10-CM | POA: Diagnosis not present

## 2017-10-25 DIAGNOSIS — L570 Actinic keratosis: Secondary | ICD-10-CM | POA: Diagnosis not present

## 2017-11-09 DIAGNOSIS — L570 Actinic keratosis: Secondary | ICD-10-CM | POA: Diagnosis not present

## 2017-11-09 DIAGNOSIS — X32XXXA Exposure to sunlight, initial encounter: Secondary | ICD-10-CM | POA: Diagnosis not present

## 2017-11-29 DIAGNOSIS — J069 Acute upper respiratory infection, unspecified: Secondary | ICD-10-CM | POA: Diagnosis not present

## 2017-12-02 DIAGNOSIS — J069 Acute upper respiratory infection, unspecified: Secondary | ICD-10-CM | POA: Diagnosis not present

## 2017-12-24 DIAGNOSIS — B301 Conjunctivitis due to adenovirus: Secondary | ICD-10-CM | POA: Diagnosis not present

## 2018-01-21 DIAGNOSIS — M810 Age-related osteoporosis without current pathological fracture: Secondary | ICD-10-CM | POA: Diagnosis not present

## 2018-02-09 DIAGNOSIS — F419 Anxiety disorder, unspecified: Secondary | ICD-10-CM | POA: Diagnosis not present

## 2018-02-09 DIAGNOSIS — E782 Mixed hyperlipidemia: Secondary | ICD-10-CM | POA: Diagnosis not present

## 2018-02-09 DIAGNOSIS — I10 Essential (primary) hypertension: Secondary | ICD-10-CM | POA: Diagnosis not present

## 2018-02-09 DIAGNOSIS — M810 Age-related osteoporosis without current pathological fracture: Secondary | ICD-10-CM | POA: Diagnosis not present

## 2018-02-09 DIAGNOSIS — F329 Major depressive disorder, single episode, unspecified: Secondary | ICD-10-CM | POA: Diagnosis not present

## 2018-02-09 DIAGNOSIS — Z0000 Encounter for general adult medical examination without abnormal findings: Secondary | ICD-10-CM | POA: Diagnosis not present

## 2018-02-24 ENCOUNTER — Other Ambulatory Visit: Payer: Self-pay | Admitting: Family Medicine

## 2018-02-24 DIAGNOSIS — Z1231 Encounter for screening mammogram for malignant neoplasm of breast: Secondary | ICD-10-CM

## 2018-03-02 DIAGNOSIS — D225 Melanocytic nevi of trunk: Secondary | ICD-10-CM | POA: Diagnosis not present

## 2018-03-02 DIAGNOSIS — L538 Other specified erythematous conditions: Secondary | ICD-10-CM | POA: Diagnosis not present

## 2018-03-02 DIAGNOSIS — L820 Inflamed seborrheic keratosis: Secondary | ICD-10-CM | POA: Diagnosis not present

## 2018-03-02 DIAGNOSIS — Z85828 Personal history of other malignant neoplasm of skin: Secondary | ICD-10-CM | POA: Diagnosis not present

## 2018-03-02 DIAGNOSIS — X32XXXA Exposure to sunlight, initial encounter: Secondary | ICD-10-CM | POA: Diagnosis not present

## 2018-03-02 DIAGNOSIS — L570 Actinic keratosis: Secondary | ICD-10-CM | POA: Diagnosis not present

## 2018-03-02 DIAGNOSIS — D2272 Melanocytic nevi of left lower limb, including hip: Secondary | ICD-10-CM | POA: Diagnosis not present

## 2018-03-02 DIAGNOSIS — L821 Other seborrheic keratosis: Secondary | ICD-10-CM | POA: Diagnosis not present

## 2018-03-02 DIAGNOSIS — D2261 Melanocytic nevi of right upper limb, including shoulder: Secondary | ICD-10-CM | POA: Diagnosis not present

## 2018-03-02 DIAGNOSIS — D2262 Melanocytic nevi of left upper limb, including shoulder: Secondary | ICD-10-CM | POA: Diagnosis not present

## 2018-03-03 ENCOUNTER — Ambulatory Visit
Admission: RE | Admit: 2018-03-03 | Discharge: 2018-03-03 | Disposition: A | Discharge status: Home / Self Care | Payer: PPO | Source: Ambulatory Visit | Attending: Family Medicine | Admitting: Family Medicine

## 2018-03-03 ENCOUNTER — Other Ambulatory Visit: Payer: Self-pay | Admitting: Family Medicine

## 2018-03-03 DIAGNOSIS — Z1231 Encounter for screening mammogram for malignant neoplasm of breast: Secondary | ICD-10-CM

## 2018-03-15 IMAGING — MG MM DIGITAL SCREENING UNILAT*L* W/ TOMO W/ CAD
7 series · 8 of 15 positions shown · non-contrast
Comparison: Previous exam(s).

CLINICAL DATA: Screening.

EXAM:
2D DIGITAL SCREENING UNILATERAL LEFT MAMMOGRAM WITH CAD AND ADJUNCT
TOMO

[L MLO (1 of 2)]
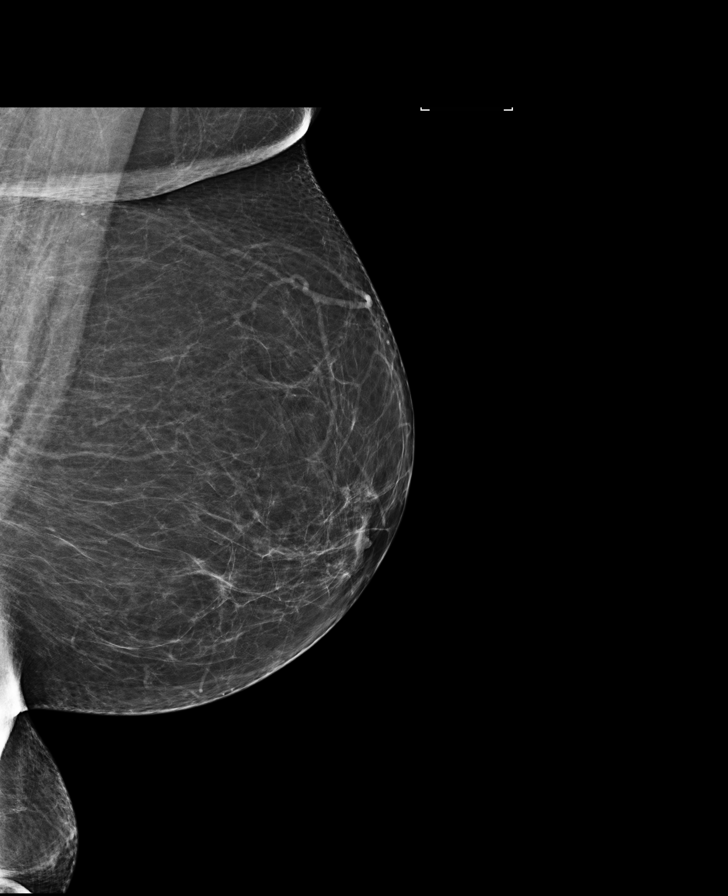

[L CC synth-2D]
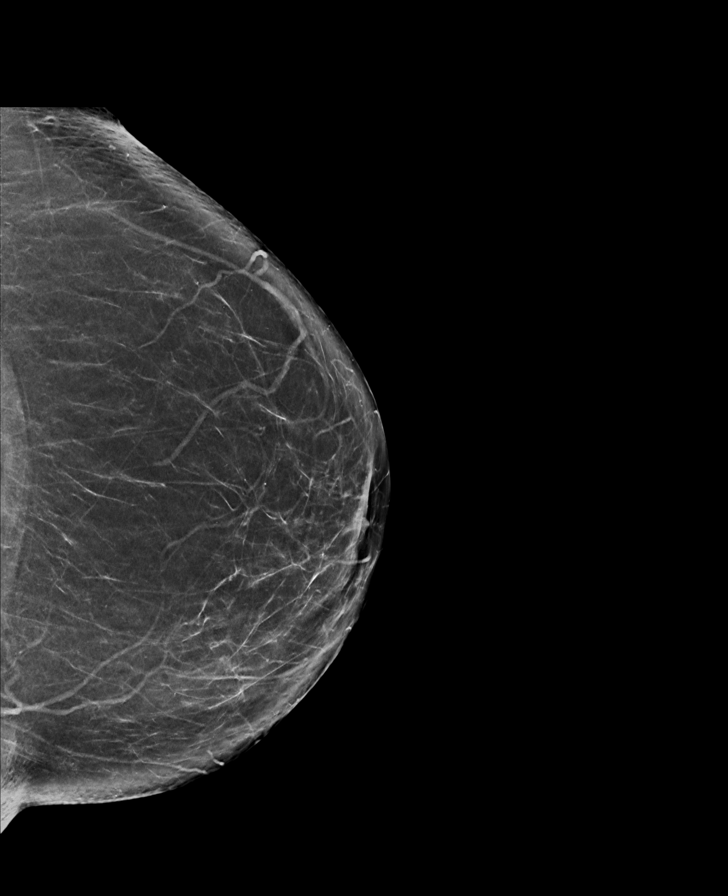

[L MLO (2 of 2)]
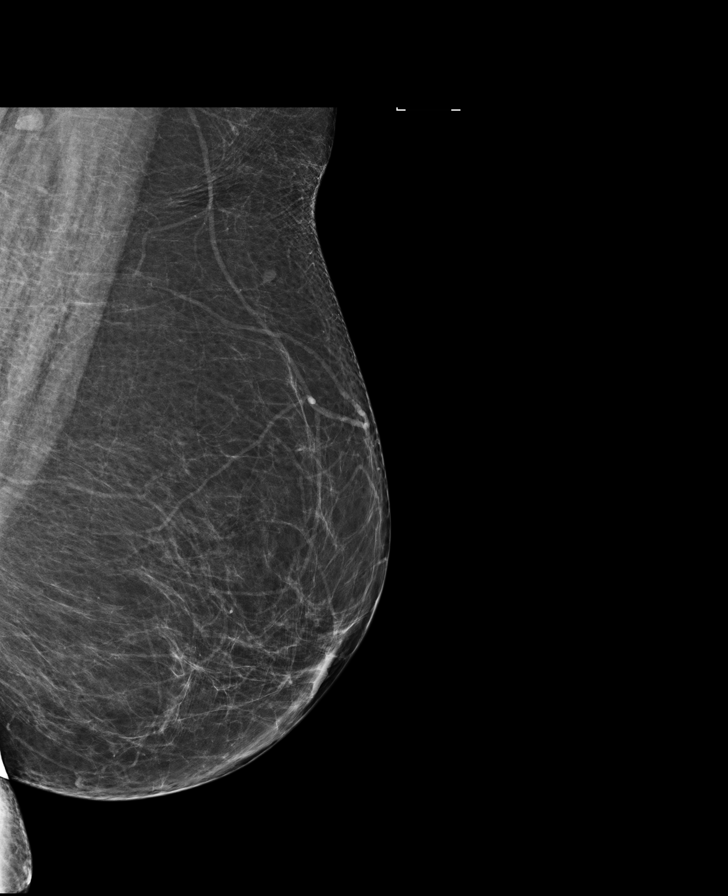

[L CC]
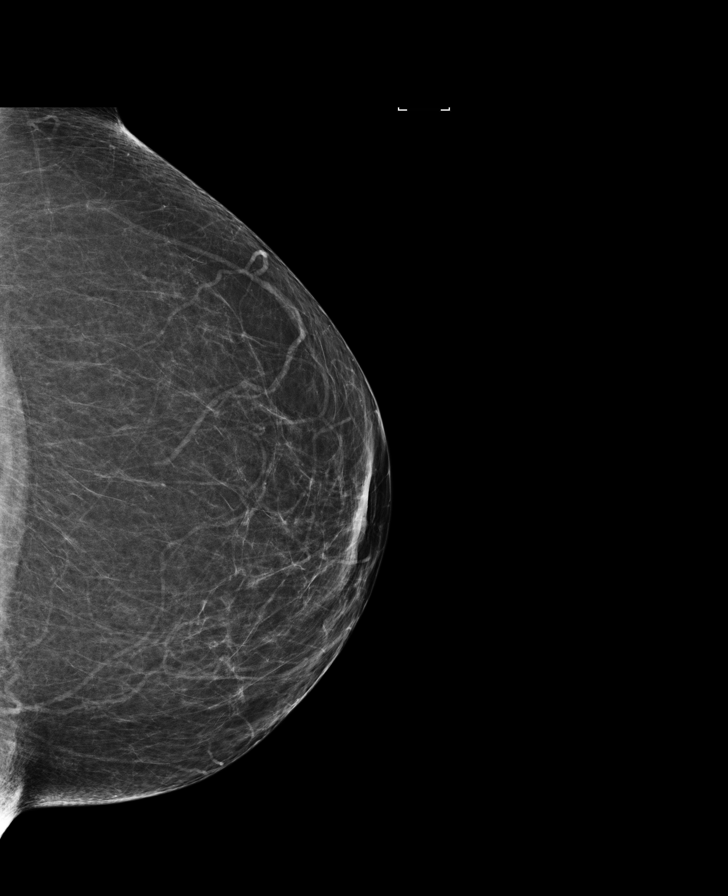

[L MLO synth-2D]
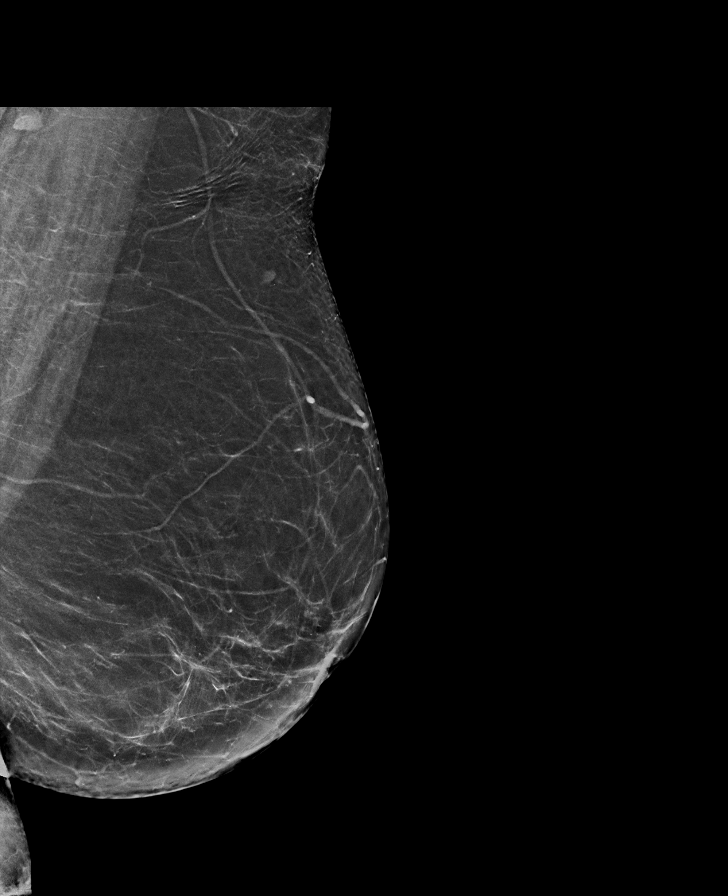

[L CC tomo · 2 of 74 frames shown]
[frame 24/74]
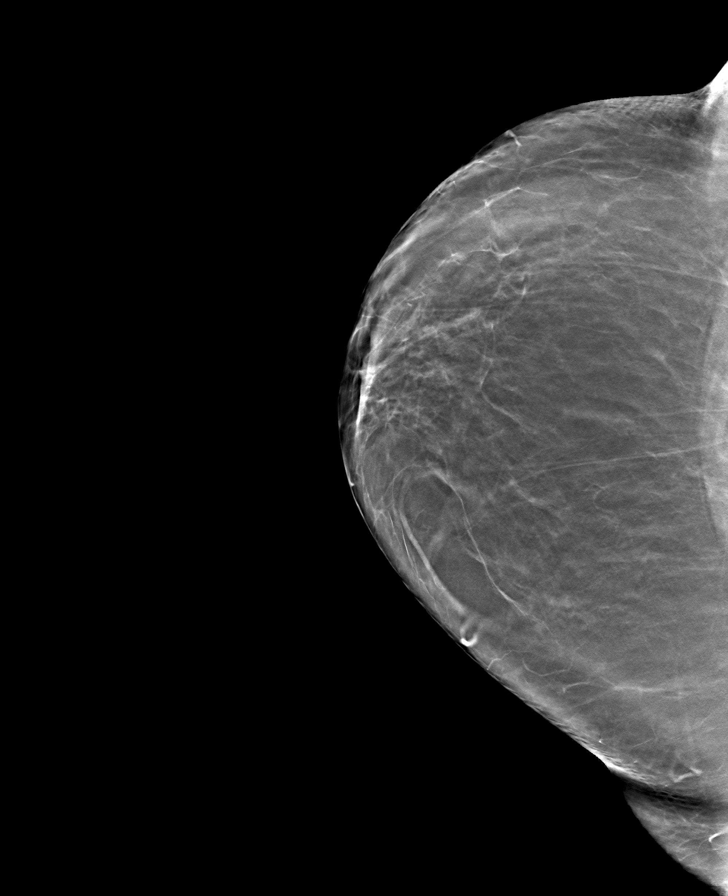
[frame 37/74]
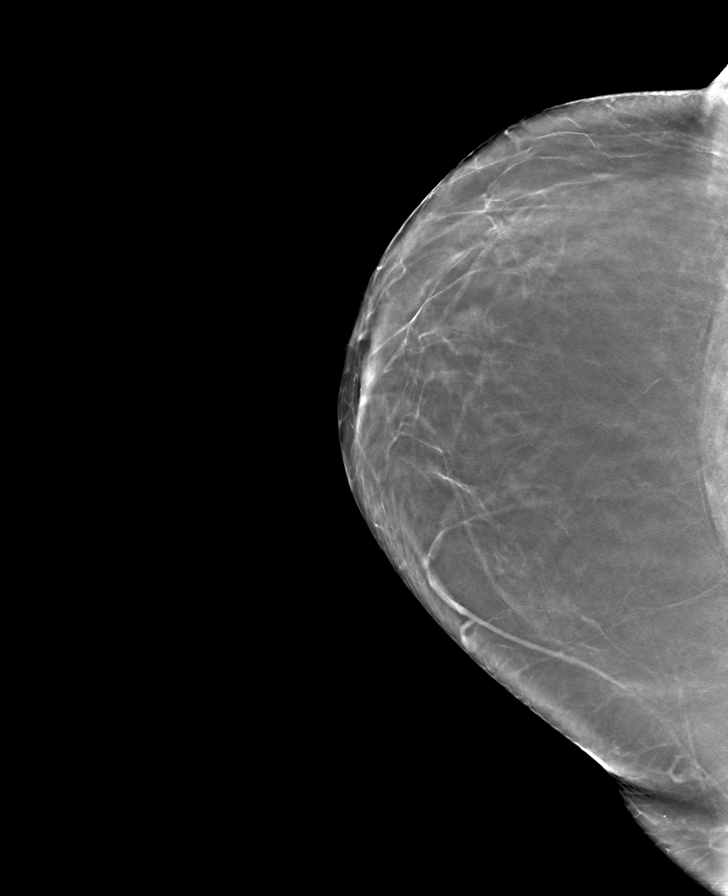

[L MLO tomo · tomo slice 39/76.0]
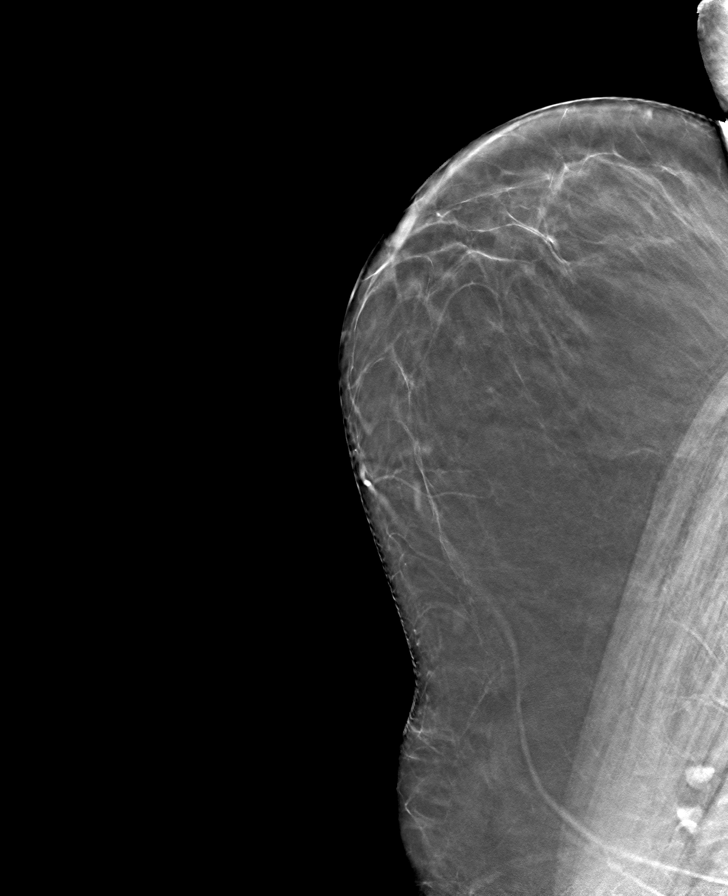

[8 of 15 positions shown; findings below may reference images not displayed]

ACR Breast Density Category b: There are scattered areas of
fibroglandular density.
FINDINGS: The patient has had a right mastectomy. There are no findings
suspicious for malignancy.

Images were processed with CAD.
IMPRESSION: No mammographic evidence of malignancy. A result letter of this
screening mammogram will be mailed directly to the patient.

RECOMMENDATION:
Screening mammogram in one year.  (Code:Y6-S-3T6)

BI-RADS CATEGORY  1: Negative.

## 2018-03-20 ENCOUNTER — Encounter
Admission: EM | Disposition: A | Discharge status: Home / Self Care | Payer: Self-pay | Source: Home / Self Care | Attending: Emergency Medicine

## 2018-03-20 ENCOUNTER — Emergency Department: Payer: PPO | Admitting: Anesthesiology

## 2018-03-20 ENCOUNTER — Encounter: Payer: Self-pay | Admitting: Emergency Medicine

## 2018-03-20 ENCOUNTER — Emergency Department: Payer: PPO

## 2018-03-20 ENCOUNTER — Other Ambulatory Visit: Payer: Self-pay

## 2018-03-20 ENCOUNTER — Observation Stay
Admission: EM | Admit: 2018-03-20 | Discharge: 2018-03-22 | Disposition: A | Discharge status: Home / Self Care | Payer: PPO | Attending: Surgery | Admitting: Surgery

## 2018-03-20 DIAGNOSIS — Z880 Allergy status to penicillin: Secondary | ICD-10-CM | POA: Diagnosis not present

## 2018-03-20 DIAGNOSIS — R1011 Right upper quadrant pain: Secondary | ICD-10-CM | POA: Diagnosis not present

## 2018-03-20 DIAGNOSIS — Z9010 Acquired absence of unspecified breast and nipple: Secondary | ICD-10-CM | POA: Insufficient documentation

## 2018-03-20 DIAGNOSIS — E785 Hyperlipidemia, unspecified: Secondary | ICD-10-CM | POA: Insufficient documentation

## 2018-03-20 DIAGNOSIS — I10 Essential (primary) hypertension: Secondary | ICD-10-CM | POA: Insufficient documentation

## 2018-03-20 DIAGNOSIS — Z79899 Other long term (current) drug therapy: Secondary | ICD-10-CM | POA: Diagnosis not present

## 2018-03-20 DIAGNOSIS — Z886 Allergy status to analgesic agent status: Secondary | ICD-10-CM | POA: Diagnosis not present

## 2018-03-20 DIAGNOSIS — Z888 Allergy status to other drugs, medicaments and biological substances status: Secondary | ICD-10-CM | POA: Diagnosis not present

## 2018-03-20 DIAGNOSIS — R079 Chest pain, unspecified: Secondary | ICD-10-CM | POA: Diagnosis not present

## 2018-03-20 DIAGNOSIS — K8010 Calculus of gallbladder with chronic cholecystitis without obstruction: Secondary | ICD-10-CM | POA: Diagnosis not present

## 2018-03-20 DIAGNOSIS — R1010 Upper abdominal pain, unspecified: Secondary | ICD-10-CM | POA: Diagnosis present

## 2018-03-20 DIAGNOSIS — F329 Major depressive disorder, single episode, unspecified: Secondary | ICD-10-CM | POA: Diagnosis not present

## 2018-03-20 DIAGNOSIS — R5381 Other malaise: Secondary | ICD-10-CM | POA: Diagnosis not present

## 2018-03-20 DIAGNOSIS — Z7983 Long term (current) use of bisphosphonates: Secondary | ICD-10-CM | POA: Diagnosis not present

## 2018-03-20 DIAGNOSIS — Z8572 Personal history of non-Hodgkin lymphomas: Secondary | ICD-10-CM | POA: Insufficient documentation

## 2018-03-20 DIAGNOSIS — F419 Anxiety disorder, unspecified: Secondary | ICD-10-CM | POA: Insufficient documentation

## 2018-03-20 DIAGNOSIS — K8012 Calculus of gallbladder with acute and chronic cholecystitis without obstruction: Principal | ICD-10-CM | POA: Insufficient documentation

## 2018-03-20 DIAGNOSIS — Z853 Personal history of malignant neoplasm of breast: Secondary | ICD-10-CM | POA: Diagnosis not present

## 2018-03-20 DIAGNOSIS — F418 Other specified anxiety disorders: Secondary | ICD-10-CM | POA: Diagnosis not present

## 2018-03-20 DIAGNOSIS — R1012 Left upper quadrant pain: Secondary | ICD-10-CM | POA: Diagnosis not present

## 2018-03-20 DIAGNOSIS — K810 Acute cholecystitis: Secondary | ICD-10-CM | POA: Diagnosis present

## 2018-03-20 DIAGNOSIS — K219 Gastro-esophageal reflux disease without esophagitis: Secondary | ICD-10-CM | POA: Diagnosis not present

## 2018-03-20 DIAGNOSIS — R112 Nausea with vomiting, unspecified: Secondary | ICD-10-CM | POA: Diagnosis not present

## 2018-03-20 HISTORY — PX: CHOLECYSTECTOMY: SHX55

## 2018-03-20 LAB — COMPREHENSIVE METABOLIC PANEL
ALT: 11 U/L — ABNORMAL LOW (ref 14–54)
AST: 17 U/L (ref 15–41)
Albumin: 4 g/dL (ref 3.5–5.0)
Alkaline Phosphatase: 70 U/L (ref 38–126)
Anion gap: 7 (ref 5–15)
BUN: 16 mg/dL (ref 6–20)
CO2: 28 mmol/L (ref 22–32)
Calcium: 8.6 mg/dL — ABNORMAL LOW (ref 8.9–10.3)
Chloride: 103 mmol/L (ref 101–111)
Creatinine, Ser: 0.72 mg/dL (ref 0.44–1.00)
GFR calc Af Amer: >60 mL/min (ref >60)
GFR calc non Af Amer: >60 mL/min (ref >60)
Glucose, Bld: 128 mg/dL — ABNORMAL HIGH (ref 65–99)
Potassium: 3.5 mmol/L (ref 3.5–5.1)
Sodium: 138 mmol/L (ref 135–145)
Total Bilirubin: 0.9 mg/dL (ref 0.3–1.2)
Total Protein: 6.8 g/dL (ref 6.5–8.1)

## 2018-03-20 LAB — CBC
HCT: 39 % (ref 35.0–47.0)
Hemoglobin: 12.9 g/dL (ref 12.0–16.0)
MCH: 29.6 pg (ref 26.0–34.0)
MCHC: 33.1 g/dL (ref 32.0–36.0)
MCV: 89.4 fL (ref 80.0–100.0)
PLATELETS: 136 10*3/uL — AB (ref 150–440)
RBC: 4.36 MIL/uL (ref 3.80–5.20)
RDW: 14.5 % (ref 11.5–14.5)
WBC: 6 10*3/uL (ref 3.6–11.0)

## 2018-03-20 LAB — TROPONIN I

## 2018-03-20 LAB — LIPASE, BLOOD: Lipase: 25 U/L (ref 11–51)

## 2018-03-20 SURGERY — LAPAROSCOPIC CHOLECYSTECTOMY
Anesthesia: General

## 2018-03-20 MED ORDER — SUCCINYLCHOLINE CHLORIDE 20 MG/ML IJ SOLN
INTRAMUSCULAR | Status: AC
  Filled 2018-03-20: qty 1

## 2018-03-20 MED ORDER — FENTANYL CITRATE (PF) 100 MCG/2ML IJ SOLN: 25.0000 ug | INTRAMUSCULAR | Status: DC | PRN

## 2018-03-20 MED ORDER — DIAZEPAM 2 MG PO TABS: 2.0000 mg | ORAL_TABLET | Freq: Four times a day (QID) | ORAL | Status: DC | PRN

## 2018-03-20 MED ORDER — SODIUM CHLORIDE 0.9 % IV SOLN
1000.0000 mL | Freq: Once | INTRAVENOUS | Status: AC
  Administered 2018-03-20: 1000 mL via INTRAVENOUS

## 2018-03-20 MED ORDER — MORPHINE SULFATE (PF) 2 MG/ML IV SOLN
2.0000 mg | Freq: Once | INTRAVENOUS | Status: AC
  Administered 2018-03-20: 2 mg via INTRAVENOUS
  Filled 2018-03-20: qty 1

## 2018-03-20 MED ORDER — FENTANYL CITRATE (PF) 100 MCG/2ML IJ SOLN
INTRAMUSCULAR | Status: AC
  Filled 2018-03-20: qty 2

## 2018-03-20 MED ORDER — PROPOFOL 10 MG/ML IV BOLUS
INTRAVENOUS | Status: DC | PRN
  Administered 2018-03-20: 100 mg via INTRAVENOUS

## 2018-03-20 MED ORDER — ONDANSETRON HCL 4 MG/2ML IJ SOLN
INTRAMUSCULAR | Status: AC
  Filled 2018-03-20: qty 2

## 2018-03-20 MED ORDER — FAMOTIDINE IN NACL 20-0.9 MG/50ML-% IV SOLN
20.0000 mg | Freq: Two times a day (BID) | INTRAVENOUS | Status: DC
  Administered 2018-03-21 (×3): 20 mg via INTRAVENOUS
  Filled 2018-03-20 (×3): qty 50

## 2018-03-20 MED ORDER — DEXAMETHASONE SODIUM PHOSPHATE 10 MG/ML IJ SOLN
INTRAMUSCULAR | Status: DC | PRN
  Administered 2018-03-20: 10 mg via INTRAVENOUS

## 2018-03-20 MED ORDER — SUCCINYLCHOLINE CHLORIDE 20 MG/ML IJ SOLN
INTRAMUSCULAR | Status: DC | PRN
  Administered 2018-03-20: 100 mg via INTRAVENOUS

## 2018-03-20 MED ORDER — ROCURONIUM BROMIDE 100 MG/10ML IV SOLN
INTRAVENOUS | Status: AC
  Filled 2018-03-20: qty 1

## 2018-03-20 MED ORDER — SUGAMMADEX SODIUM 200 MG/2ML IV SOLN
INTRAVENOUS | Status: DC | PRN
  Administered 2018-03-20: 167.8 mg via INTRAVENOUS

## 2018-03-20 MED ORDER — SERTRALINE HCL 50 MG PO TABS: 50.0000 mg | ORAL_TABLET | Freq: Two times a day (BID) | ORAL | Status: DC

## 2018-03-20 MED ORDER — LIDOCAINE HCL (CARDIAC) PF 100 MG/5ML IV SOSY
PREFILLED_SYRINGE | INTRAVENOUS | Status: DC | PRN
  Administered 2018-03-20: 100 mg via INTRAVENOUS

## 2018-03-20 MED ORDER — METRONIDAZOLE IN NACL 5-0.79 MG/ML-% IV SOLN
500.0000 mg | Freq: Three times a day (TID) | INTRAVENOUS | Status: DC
  Administered 2018-03-20: 500 mg via INTRAVENOUS
  Filled 2018-03-20 (×2): qty 100

## 2018-03-20 MED ORDER — BUPIVACAINE-EPINEPHRINE (PF) 0.5% -1:200000 IJ SOLN
INTRAMUSCULAR | Status: DC | PRN
Start: 2018-03-20 — End: 2018-03-20
  Administered 2018-03-20: 10 mL via PERINEURAL
  Administered 2018-03-20: 20 mL via PERINEURAL

## 2018-03-20 MED ORDER — ACETAMINOPHEN 10 MG/ML IV SOLN
INTRAVENOUS | Status: DC | PRN
  Administered 2018-03-20: 1000 mg via INTRAVENOUS

## 2018-03-20 MED ORDER — ALENDRONATE SODIUM 70 MG PO TABS: 70.0000 mg | ORAL_TABLET | ORAL | Status: DC

## 2018-03-20 MED ORDER — PHENYLEPHRINE HCL 10 MG/ML IJ SOLN
INTRAMUSCULAR | Status: DC | PRN
  Administered 2018-03-20 (×4): 100 ug via INTRAVENOUS

## 2018-03-20 MED ORDER — ONDANSETRON HCL 4 MG/2ML IJ SOLN
4.0000 mg | Freq: Once | INTRAMUSCULAR | Status: AC
  Administered 2018-03-20: 4 mg via INTRAVENOUS
  Filled 2018-03-20: qty 2

## 2018-03-20 MED ORDER — METRONIDAZOLE IN NACL 5-0.79 MG/ML-% IV SOLN
500.0000 mg | Freq: Three times a day (TID) | INTRAVENOUS | Status: DC
  Administered 2018-03-20 – 2018-03-22 (×5): 500 mg via INTRAVENOUS
  Filled 2018-03-20 (×7): qty 100

## 2018-03-20 MED ORDER — CIPROFLOXACIN IN D5W 400 MG/200ML IV SOLN
400.0000 mg | Freq: Two times a day (BID) | INTRAVENOUS | Status: DC
  Administered 2018-03-20: 400 mg via INTRAVENOUS
  Filled 2018-03-20: qty 200

## 2018-03-20 MED ORDER — MORPHINE SULFATE (PF) 4 MG/ML IV SOLN
4.0000 mg | Freq: Once | INTRAVENOUS | Status: AC
  Administered 2018-03-20: 4 mg via INTRAVENOUS
  Filled 2018-03-20: qty 1

## 2018-03-20 MED ORDER — DEXAMETHASONE SODIUM PHOSPHATE 10 MG/ML IJ SOLN
INTRAMUSCULAR | Status: AC
  Filled 2018-03-20: qty 1

## 2018-03-20 MED ORDER — LIDOCAINE HCL (PF) 2 % IJ SOLN
INTRAMUSCULAR | Status: AC
  Filled 2018-03-20: qty 10

## 2018-03-20 MED ORDER — ONDANSETRON HCL 4 MG/2ML IJ SOLN
INTRAMUSCULAR | Status: DC | PRN
  Administered 2018-03-20: 4 mg via INTRAVENOUS

## 2018-03-20 MED ORDER — TRAZODONE HCL 50 MG PO TABS: 50.0000 mg | ORAL_TABLET | Freq: Every day | ORAL | Status: DC

## 2018-03-20 MED ORDER — SUGAMMADEX SODIUM 200 MG/2ML IV SOLN
INTRAVENOUS | Status: AC
  Filled 2018-03-20: qty 2

## 2018-03-20 MED ORDER — PROPOFOL 10 MG/ML IV BOLUS
INTRAVENOUS | Status: AC
Start: 2018-03-20 — End: ?
  Filled 2018-03-20: qty 20

## 2018-03-20 MED ORDER — ROCURONIUM BROMIDE 100 MG/10ML IV SOLN
INTRAVENOUS | Status: DC | PRN
  Administered 2018-03-20: 40 mg via INTRAVENOUS
  Administered 2018-03-20: 10 mg via INTRAVENOUS

## 2018-03-20 MED ORDER — HYDROCODONE-ACETAMINOPHEN 5-325 MG PO TABS
1.0000 | ORAL_TABLET | ORAL | Status: DC | PRN
  Administered 2018-03-21 – 2018-03-22 (×4): 1 via ORAL
  Administered 2018-03-22: 2 via ORAL
  Filled 2018-03-20 (×2): qty 1
  Filled 2018-03-20: qty 2
  Filled 2018-03-20 (×2): qty 1

## 2018-03-20 MED ORDER — MECLIZINE HCL 25 MG PO TABS: 25.0000 mg | ORAL_TABLET | Freq: Three times a day (TID) | ORAL | Status: DC | PRN

## 2018-03-20 MED ORDER — ACETAMINOPHEN 10 MG/ML IV SOLN
INTRAVENOUS | Status: AC
  Filled 2018-03-20: qty 100

## 2018-03-20 MED ORDER — FAMOTIDINE IN NACL 20-0.9 MG/50ML-% IV SOLN: 20.0000 mg | Freq: Two times a day (BID) | INTRAVENOUS | Status: DC

## 2018-03-20 MED ORDER — ENOXAPARIN SODIUM 40 MG/0.4ML ~~LOC~~ SOLN
40.0000 mg | SUBCUTANEOUS | Status: DC
  Administered 2018-03-21: 40 mg via SUBCUTANEOUS
  Filled 2018-03-20 (×2): qty 0.4

## 2018-03-20 MED ORDER — LACTATED RINGERS IV SOLN
INTRAVENOUS | Status: DC | PRN
  Administered 2018-03-20: 13:00:00 via INTRAVENOUS

## 2018-03-20 MED ORDER — ONDANSETRON HCL 4 MG/2ML IJ SOLN: 4.0000 mg | Freq: Once | INTRAMUSCULAR | Status: DC | PRN

## 2018-03-20 MED ORDER — BISOPROLOL-HYDROCHLOROTHIAZIDE 2.5-6.25 MG PO TABS: 1.0000 | ORAL_TABLET | Freq: Every day | ORAL | Status: DC

## 2018-03-20 MED ORDER — FENTANYL CITRATE (PF) 100 MCG/2ML IJ SOLN
INTRAMUSCULAR | Status: DC | PRN
  Administered 2018-03-20: 50 ug via INTRAVENOUS
  Administered 2018-03-20 (×2): 25 ug via INTRAVENOUS

## 2018-03-20 MED ORDER — ENOXAPARIN SODIUM 40 MG/0.4ML ~~LOC~~ SOLN: 40.0000 mg | SUBCUTANEOUS | Status: DC

## 2018-03-20 MED ORDER — SODIUM CHLORIDE 0.9 % IV SOLN
INTRAVENOUS | Status: DC
  Administered 2018-03-20: 17:00:00 via INTRAVENOUS

## 2018-03-20 MED ORDER — MORPHINE SULFATE (PF) 2 MG/ML IV SOLN: 2.0000 mg | INTRAVENOUS | Status: DC | PRN

## 2018-03-20 MED ORDER — MORPHINE SULFATE (PF) 4 MG/ML IV SOLN
4.0000 mg | INTRAVENOUS | Status: DC | PRN
  Administered 2018-03-21: 4 mg via INTRAVENOUS
  Filled 2018-03-20: qty 1

## 2018-03-20 MED ORDER — CIPROFLOXACIN IN D5W 400 MG/200ML IV SOLN
400.0000 mg | Freq: Two times a day (BID) | INTRAVENOUS | Status: DC
  Administered 2018-03-21 – 2018-03-22 (×3): 400 mg via INTRAVENOUS
  Filled 2018-03-20 (×5): qty 200

## 2018-03-20 SURGICAL SUPPLY — 39 items
ADH SKN CLS APL DERMABOND .7 (GAUZE/BANDAGES/DRESSINGS) ×1
APPLIER CLIP 5 13 M/L LIGAMAX5 (MISCELLANEOUS) ×3
APR CLP MED LRG 5 ANG JAW (MISCELLANEOUS) ×1
BAG SPEC RTRVL LRG 6X4 10 (ENDOMECHANICALS) ×1
BLADE SURG SZ11 CARB STEEL (BLADE) ×3 IMPLANT
CANISTER SUCT 1200ML W/VALVE (MISCELLANEOUS) ×3 IMPLANT
CHLORAPREP W/TINT 26ML (MISCELLANEOUS) ×3 IMPLANT
CLIP APPLIE 5 13 M/L LIGAMAX5 (MISCELLANEOUS) ×1 IMPLANT
DERMABOND ADVANCED (GAUZE/BANDAGES/DRESSINGS) ×2
DERMABOND ADVANCED .7 DNX12 (GAUZE/BANDAGES/DRESSINGS) ×1 IMPLANT
ELECT REM PT RETURN 9FT ADLT (ELECTROSURGICAL) ×3
ELECTRODE REM PT RTRN 9FT ADLT (ELECTROSURGICAL) ×1 IMPLANT
GLOVE BIO SURGEON STRL SZ 6.5 (GLOVE) ×2 IMPLANT
GLOVE BIO SURGEONS STRL SZ 6.5 (GLOVE) ×1
GOWN STRL REUS W/ TWL LRG LVL3 (GOWN DISPOSABLE) ×4 IMPLANT
GOWN STRL REUS W/TWL LRG LVL3 (GOWN DISPOSABLE) ×12
GRASPER SUT TROCAR 14GX15 (MISCELLANEOUS) IMPLANT
HEMOSTAT SURGICEL 2X3 (HEMOSTASIS) IMPLANT
IRRIGATION STRYKERFLOW (MISCELLANEOUS) ×1 IMPLANT
IRRIGATOR STRYKERFLOW (MISCELLANEOUS) ×3
IV NS 1000ML (IV SOLUTION) ×3
IV NS 1000ML BAXH (IV SOLUTION) ×1 IMPLANT
KIT TURNOVER KIT A (KITS) ×3 IMPLANT
LABEL OR SOLS (LABEL) ×3 IMPLANT
NDL INSUFFLATION 14GA 120MM (NEEDLE) ×1 IMPLANT
NEEDLE HYPO 25X1 1.5 SAFETY (NEEDLE) ×3 IMPLANT
NEEDLE INSUFFLATION 14GA 120MM (NEEDLE) ×3 IMPLANT
NS IRRIG 500ML POUR BTL (IV SOLUTION) ×3 IMPLANT
PACK LAP CHOLECYSTECTOMY (MISCELLANEOUS) ×3 IMPLANT
PENCIL ELECTRO HAND CTR (MISCELLANEOUS) IMPLANT
POUCH SPECIMEN RETRIEVAL 10MM (ENDOMECHANICALS) ×3 IMPLANT
SCISSORS METZENBAUM CVD 33 (INSTRUMENTS) ×3 IMPLANT
SLEEVE ENDOPATH XCEL 5M (ENDOMECHANICALS) ×6 IMPLANT
SUT MNCRL AB 4-0 PS2 18 (SUTURE) ×3 IMPLANT
SUT VIC AB 0 CT1 36 (SUTURE) IMPLANT
SUT VICRYL 0 AB UR-6 (SUTURE) ×3 IMPLANT
TROCAR XCEL NON-BLD 11X100MML (ENDOMECHANICALS) ×3 IMPLANT
TROCAR XCEL NON-BLD 5MMX100MML (ENDOMECHANICALS) ×3 IMPLANT
TUBING INSUFFLATION (TUBING) ×3 IMPLANT

## 2018-03-20 NOTE — Anesthesia Procedure Notes (Signed)
Procedure Name: Intubation Date/Time: 03/20/2018 1:57 PM Performed by: Nelda Marseille, CRNA Pre-anesthesia Checklist: Patient identified, Patient being monitored, Timeout performed, Emergency Drugs available and Suction available Patient Re-evaluated:Patient Re-evaluated prior to induction Oxygen Delivery Method: Circle system utilized Preoxygenation: Pre-oxygenation with 100% oxygen Induction Type: IV induction Ventilation: Mask ventilation without difficulty Laryngoscope Size: Mac and 3 Grade View: Grade I Tube type: Oral Tube size: 7.0 mm Number of attempts: 1 Airway Equipment and Method: Stylet Placement Confirmation: ETT inserted through vocal cords under direct vision,  positive ETCO2 and breath sounds checked- equal and bilateral Secured at: 21 cm Tube secured with: Tape Dental Injury: Teeth and Oropharynx as per pre-operative assessment

## 2018-03-20 NOTE — ED Notes (Signed)
Patient transported to Ultrasound 

## 2018-03-20 NOTE — Care Management Note (Signed)
Case Management Note  Patient Details  Name: Lindsey Barnes MRN: [id number] Date of Birth: 1933/07/02  Subjective/Objective:    Patient admitted to [facility] under observation status for acute cholecystitis. Daughter Isabella Stalling [phone] is at the bedside to assist with [facility] consult. Patient lives alone and uses a walker but no other DME. Daughter has concerns for patients ability to complete activities of daily living. PCP is Dr Lovie Macadamia. [place] and obtains medications without issue.                   Action/Plan:  Currently on acute oxygen, if need continues can coordinate needs. Possible PT consult Expected Discharge Date:                  Expected Discharge Plan:     In-House Referral:     Discharge planning Services     Post Acute Care Choice:    Choice offered to:     DME Arranged:    DME Agency:     HH Arranged:    HH Agency:     Status of Service:     If discussed at [employer], dates discussed:    Additional Comments:  Latanya Maudlin, RN 03/20/2018, 5:03 PM

## 2018-03-20 NOTE — Op Note (Signed)
Preoperative diagnosis: Acute cholecystitis.  Postoperative diagnosis: Acute purulent cholecystitis.  Procedure: Laparoscopic Cholecystectomy.   Anesthesia: GETA   Surgeon: Dr. Windell Moment  Wound Classification: Clean Contaminated  Indications: Patient is a 84 y.o. female developed right upper quadrant pain, nausea and vomiting and on workup was found to have cholelithiasis with cholecystitis with normal liver enzymes and bilirubin. Laparoscopic cholecystectomy was elected.  Findings: Mixture of clear and purulent bile was aspirated Critical view of safety achieved Cystic duct and artery identified, ligated and divided Adequate hemostasis  Description of procedure: The patient was placed on the operating table in the supine position. General anesthesia was induced. A time-out was completed verifying correct patient, procedure, site, positioning, and implant(s) and/or special equipment prior to beginning this procedure. An orogastric tube was placed. The abdomen was prepped and draped in the usual sterile fashion.  An incision was made in a natural skin line above the umbilicus.  The fascia was elevated and the Veress needle inserted. Proper position was confirmed by aspiration and saline meniscus test.  The abdomen was insufflated with carbon dioxide to a pressure of 15 mmHg. The patient tolerated insufflation well. A 11-mm trocar was then inserted.  The laparoscope was inserted and the abdomen inspected. No injuries from initial trocar placement were noted. Additional trocars were then inserted in the following locations: a 5-mm trocar in the right epigastrium and two 5-mm trocars along the right costal margin. The abdomen was inspected and no abnormalities were found. The table was placed in the reverse Trendelenburg position with the right side up.  Filmy adhesions between the gallbladder and omentum, duodenum and transverse colon were lysed sharply. The dome of the gallbladder was  unable to be grasped due to tense gallbladder. The dome was aspirated with a needle and clear purulent bile was aspirated. With an atraumatic grasper the dome was grasped and passed through the lateral port and retracted over the dome of the liver. The infundibulum was also grasped with an atraumatic grasper through the midclavicular port and retracted toward the right lower quadrant. This maneuver exposed Calot's triangle. The thick peritoneum overlying the gallbladder infundibulum was then incised and the cystic duct and cystic artery identified and circumferentially dissected. The critical view of safety structures were reviewed before ligating any structure. The cystic duct and cystic artery were then doubly clipped and divided close to the gallbladder.  The gallbladder was then dissected from its peritoneal attachments by electrocautery. Hemostasis was checked and the gallbladder and contained stones were removed using an endoscopic retrieval bag placed through the umbilical port. The gallbladder was passed off the table as a specimen. The gallbladder fossa was copiously irrigated with saline and hemostasis was obtained. There was no evidence of bleeding from the gallbladder fossa or cystic artery or leakage of the bile from the cystic duct stump. The umbilical fascia was closed with PMI with figure of eight 0 vicryl suture. The laparoscope was withdrawn. The abdomen was allowed to collapse. Secondary trocars were removed. No bleeding was noted. The skin was closed with subcuticular sutures of 4-0 monocryl and topical skin adhesive. The orogastric tube was removed.  The patient tolerated the procedure well and was taken to the postanesthesia care unit in stable condition.   Specimen: Gallbladder  Complications: None  EBL: 64mL

## 2018-03-20 NOTE — ED Notes (Signed)
Patient c/o epigastric pain radiating below right breast. Patient reports N/V with 2 emeses.

## 2018-03-20 NOTE — Progress Notes (Signed)
Doing well post-op, hasn't taken p.o. Fluids yet, sleeping, easily arousable. VSS.

## 2018-03-20 NOTE — ED Notes (Signed)
Pt states that she started having abdominal pain/right sided chest pain since last night. Pt states pain is "8/10" now.

## 2018-03-20 NOTE — Anesthesia Preprocedure Evaluation (Signed)
Anesthesia Evaluation  Patient identified by MRN, date of birth, ID band Patient awake    Reviewed: Allergy & Precautions, NPO status , Patient's Chart, lab work & pertinent test results  History of Anesthesia Complications (+) PONV and history of anesthetic complications  Airway Mallampati: III       Dental  (+) Lower Dentures, Upper Dentures   Pulmonary neg sleep apnea, neg COPD,           Cardiovascular hypertension, Pt. on medications (-) Past MI and (-) CHF (-) dysrhythmias (-) Valvular Problems/Murmurs     Neuro/Psych neg Seizures Anxiety Depression    GI/Hepatic Neg liver ROS, GERD  Medicated,  Endo/Other  neg diabetes  Renal/GU negative Renal ROS     Musculoskeletal   Abdominal   Peds  Hematology   Anesthesia Other Findings   Reproductive/Obstetrics                             Anesthesia Physical Anesthesia Plan  ASA: III and emergent  Anesthesia Plan: General   Post-op Pain Management:    Induction:   PONV Risk Score and Plan: 4 or greater and Dexamethasone, Ondansetron, Midazolam and Treatment may vary due to age or medical condition  Airway Management Planned: Oral ETT  Additional Equipment:   Intra-op Plan:   Post-operative Plan:   Informed Consent: I have reviewed the patients History and Physical, chart, labs and discussed the procedure including the risks, benefits and alternatives for the proposed anesthesia with the patient or authorized representative who has indicated his/her understanding and acceptance.     Plan Discussed with:   Anesthesia Plan Comments:         Anesthesia Quick Evaluation

## 2018-03-20 NOTE — Progress Notes (Addendum)

## 2018-03-20 NOTE — ED Triage Notes (Signed)
Pt reports upper abd/chest pain since last night; pain under right breast area; pt also reports N/V, last time she vomited was last night; pt tender on palpation across upper abd;

## 2018-03-20 NOTE — Anesthesia Post-op Follow-up Note (Signed)
Anesthesia QCDR form completed.        

## 2018-03-20 NOTE — Anesthesia Postprocedure Evaluation (Signed)
Anesthesia Post Note  Patient: Lindsey Barnes  Procedure(s) Performed: LAPAROSCOPIC CHOLECYSTECTOMY (N/A )  Patient location during evaluation: PACU Anesthesia Type: General Level of consciousness: awake and alert Pain management: pain level controlled Vital Signs Assessment: post-procedure vital signs reviewed and stable Respiratory status: spontaneous breathing and respiratory function stable Cardiovascular status: stable Anesthetic complications: no     Last Vitals:  Vitals:   03/20/18 1610 03/20/18 1742  BP: 116/71 121/63  Pulse: 67 69  Resp: 20 20  Temp:  36.7 C  SpO2: 97% 98%    Last Pain:  Vitals:   03/20/18 1742  TempSrc: Oral  PainSc:                  Noreen Mackintosh K

## 2018-03-20 NOTE — Transfer of Care (Signed)
Immediate Anesthesia Transfer of Care Note  Patient: Lindsey Barnes  Procedure(s) Performed: LAPAROSCOPIC CHOLECYSTECTOMY (N/A )  Patient Location: PACU  Anesthesia Type:General  Level of Consciousness: sedated  Airway & Oxygen Therapy: Patient Spontanous Breathing and Patient connected to face mask oxygen  Post-op Assessment: Report given to RN and Post -op Vital signs reviewed and stable  Post vital signs: Reviewed and stable  Last Vitals:  Vitals Value Taken Time  BP    Temp    Pulse 79 03/20/2018  3:10 PM  Resp 13 03/20/2018  3:10 PM  SpO2 98 % 03/20/2018  3:10 PM  Vitals shown include unvalidated device data.  Last Pain:  Vitals:   03/20/18 1256  TempSrc:   PainSc: 2          Complications: No apparent anesthesia complications

## 2018-03-20 NOTE — ED Provider Notes (Signed)
[facility] Emergency Department Provider Note   ____________________________________________    I have reviewed the triage vital signs and the nursing notes.   HISTORY  Chief Complaint Abdominal pain    HPI Lindsey Barnes is a 84 y.o. female who presents with complaints of moderate, crampy upper abdominal pain.  Patient reports she "did not feel" yesterday after dinner developed nausea and vomiting for several hours.  Today she complains of pain in her upper abdomen right greater than left.  No history of the same.  Has a history of an appendectomy but no other abdominal surgeries.  Took Tylenol without relief.  No fevers reported.  No rash reported.  Pain does radiate to her right shoulder  Past Medical History:  Diagnosis Date  . Anxiety   . Breast cancer ([place])    1990  . Complication of anesthesia   . Depression   . Dyspnea    DOE  . GERD (gastroesophageal reflux disease)   . Headache(784.0)   . HOH (hard of hearing)   . Hypertension   . Lymphoma ([employer])   . Pain    CHRONIC BACK  . Pain    CHRONIC BACK  . Pneumonia   . PONV (postoperative nausea and vomiting)   . Tremors of nervous system    HANDS OCCAS    Patient Active Problem List   Diagnosis Date Noted  . Compression fracture of T12 vertebra ([place]) 01/13/2012  . HTN (hypertension) 01/13/2012  . Hyperlipidemia 01/13/2012  . Low back pain 01/13/2012    Past Surgical History:  Procedure Laterality Date  . APPENDECTOMY    . BACK SURGERY    . BREAST SURGERY    . CATARACT EXTRACTION W/PHACO Left 01/26/2017   Procedure: CATARACT EXTRACTION PHACO AND INTRAOCULAR LENS PLACEMENT (IOC);  Surgeon: Birder Robson, MD;  Location: [facility] ORS;  Service: Ophthalmology;  Laterality: Left;  [place] 53.7 AP% 20.3 CDE 10.90 Fluid pack lot # [id number]  . CATARACT EXTRACTION W/PHACO Right 02/16/2017   Procedure: CATARACT EXTRACTION PHACO AND INTRAOCULAR LENS PLACEMENT (IOC);  Surgeon: Birder Robson, MD;  Location: [facility] ORS;  Service: Ophthalmology;  Laterality: Right;  [place] 00:55 AP% 22.5 CDE 12.43 Fluid pack lot # [id number]  . CESAREAN SECTION    . MASTECTOMY Right 1992  . TUBAL LIGATION      Prior to Admission medications   Medication Sig Start Date End Date Taking? Authorizing Provider  alendronate (FOSAMAX) 70 MG tablet Take 70 mg by mouth once a week. Take with a full glass of water on an empty stomach.   Yes [provider]  ALPRAZolam (XANAX) 0.25 MG tablet Take 1 tablet (0.25 mg total) by mouth 3 (three) times daily as needed. For anxiety 01/19/12  Yes Silvestre Moment, MD  bisoprolol-hydrochlorothiazide [facility]) 2.5-6.25 MG per tablet Take 1 tablet by mouth daily. 5-6.25 tab   Yes [provider]  calcium carbonate (OSCAL) 1500 (600 Ca) MG TABS tablet Take 1,000 mg by mouth daily.   Yes [provider]  cholecalciferol (VITAMIN D) 1000 units tablet Take 1,000 Units by mouth daily.   Yes [provider]  diazepam (VALIUM) 2 MG tablet Take 2 mg by mouth every 6 (six) hours as needed for anxiety.   Yes [provider]  HYDROcodone-acetaminophen (NORCO/VICODIN) 5-325 MG tablet Take 1 tablet by mouth every 6 (six) hours as needed for moderate pain.   Yes [provider]  meclizine (ANTIVERT) 25 MG tablet Take 25  mg by mouth 3 (three) times daily as needed. For dizziness   Yes [provider]  ranitidine (ZANTAC) 150 MG tablet Take 150 mg by mouth 2 (two) times daily.   Yes [provider]  scopolamine (TRANSDERM-SCOP) 1 MG/3DAYS Place 1 patch onto the skin every 3 (three) days. As needed   Yes [provider]  sertraline (ZOLOFT) 100 MG tablet Take 100 mg by mouth daily. 1 am 0.5 hs   Yes [provider]  simvastatin (ZOCOR) 40 MG tablet Take 40 mg by mouth every evening.   Yes [provider]  traZODone (DESYREL) 50 MG tablet Take 50 mg by mouth at bedtime.   Yes [provider]     Allergies Amoxicillin; Aspirin; and Benadryl [diphenhydramine hcl]  History reviewed. No pertinent family history.  Social History Social History   Tobacco Use  . Smoking status: Never Smoker  . Smokeless tobacco: Never Used  Substance Use Topics  . Alcohol use: No  . Drug use: Yes    Types: Hydrocodone, Benzodiazepines    Review of Systems  Constitutional: No fever/chills Eyes: No visual changes.  ENT: No sore throat. Cardiovascular: Denies chest pain. Respiratory: No cough Gastrointestinal: As above Genitourinary: Negative for dysuria. Musculoskeletal: Chronic back pain Skin: Negative for rash. Neurological: Negative for headaches    ____________________________________________   PHYSICAL EXAM:  VITAL SIGNS: ED Triage Vitals  Enc Vitals Group     BP 03/20/18 0620 (!) 152/80     Pulse Rate 03/20/18 0620 70     Resp 03/20/18 0620 17     Temp 03/20/18 0620 97.8 F (36.6 C)     Temp Source 03/20/18 0620 Oral     SpO2 03/20/18 0620 95 %     Weight 03/20/18 0617 83.9 kg (185 lb)     Height 03/20/18 0617 1.702 m (5\' 7" )     Head Circumference --      Peak Flow --      Pain Score 03/20/18 0615 7     Pain Loc --      Pain Edu? --      Excl. in [employer]? --     Constitutional: Alert and oriented. No acute distress. Pleasant and interactive Eyes: Conjunctivae are normal.   Nose: No congestion/rhinnorhea. Mouth/Throat: Mucous membranes are moist.    Cardiovascular: Normal rate, regular rhythm. Grossly normal heart sounds.  Good peripheral circulation. Respiratory: Normal respiratory effort.  No retractions. Lungs CTAB. Gastrointestinal: Tenderness palpation right upper quadrant, moderate. No distention.  No CVA tenderness.  Musculoskeletal: No lower extremity tenderness nor edema.  Warm and well perfused Neurologic:  Normal speech and language. No gross focal neurologic deficits are appreciated.  Skin:  Skin is warm, dry and intact. No rash  noted. Psychiatric: Mood and affect are normal. Speech and behavior are normal.  ____________________________________________   LABS (all labs ordered are listed, but only abnormal results are displayed)  Labs Reviewed  CBC - Abnormal; Notable for the following components:      Result Value   Platelets 136 (*)    All other components within normal limits  COMPREHENSIVE METABOLIC PANEL - Abnormal; Notable for the following components:   Glucose, Bld 128 (*)    Calcium 8.6 (*)    ALT 11 (*)    All other components within normal limits  TROPONIN I  LIPASE, BLOOD   ____________________________________________  EKG  ED ECG REPORT I, Lavonia Drafts, the attending physician, personally viewed and interpreted this ECG.  Date: 03/20/2018  Rhythm: normal sinus rhythm QRS Axis: Left axis deviation Intervals: normal ST/T Wave abnormalities: Nonspecific changes   ____________________________________________  RADIOLOGY  Chest x-ray no pneumonia ____________________________________________   PROCEDURES  Procedure(s) performed: No  Procedures   Critical Care performed: No ____________________________________________   INITIAL IMPRESSION / ASSESSMENT AND PLAN / ED COURSE  Pertinent labs & imaging results that were available during my care of the patient were reviewed by me and considered in my medical decision making (see chart for details).  Patient presents with upper abdominal pain as described above.  Differential includes gastritis, GERD, PUD, cholelithiasis/pancreatitis  We will give IV morphine, IV Zofran, IV fluids, obtain right upper quadrant ultrasound while we await labs  ----------------------------------------- 9:30 AM on 03/20/2018 -----------------------------------------  Patient's ultrasound demonstrates acute calculus cholecystitis.  Common bile duct 8 mm likely normal for her age, confirmed with Dr. Alice Reichert of GI especially given normal LFTs.   Discussed with Dr. Peyton Najjar of surgery who will see the patient    ____________________________________________   FINAL CLINICAL IMPRESSION(S) / ED DIAGNOSES  Final diagnoses:  Upper abdominal pain  Cholecystitis, acute        Note:  This document was prepared using Dragon voice recognition software and may include unintentional dictation errors.    Lavonia Drafts, MD 03/20/18 [phone]

## 2018-03-20 NOTE — H&P (Addendum)
SURGICAL CONSULTATION NOTE   HISTORY OF PRESENT ILLNESS (HPI):  84 y.o. female presented to [facility] ED for evaluation of abdominal pain. Patient reports pains started yesterday afternoon. Pain localized to the right upper quadrant and radiated to her right back. Also pain was associated with nausea and vomiting. Pain has not been able to be improved with morphine at the ED. Patient cannot associate any aggravators.  Denies fever or chills.  Patient had ultrasound done at the ED and was found with gallstones, gallbladder distention and positive Murphy's signs.  Patient refers she lives by herself and is very independent. Denies chest pain and shortness of breath. Denies using aspirin or any blood thinner.   Surgery is consulted by Dr. Corky Downs in this context for evaluation and management of acute cholecystitis.  PAST MEDICAL HISTORY (PMH):  Past Medical History:  Diagnosis Date  . Anxiety   . Breast cancer ([place])    1990  . Complication of anesthesia   . Depression   . Dyspnea    DOE  . GERD (gastroesophageal reflux disease)   . Headache(784.0)   . HOH (hard of hearing)   . Hypertension   . Lymphoma ([place])   . Pain    CHRONIC BACK  . Pain    CHRONIC BACK  . Pneumonia   . PONV (postoperative nausea and vomiting)   . Tremors of nervous system    HANDS OCCAS     PAST SURGICAL HISTORY ([place]):  Past Surgical History:  Procedure Laterality Date  . APPENDECTOMY    . BACK SURGERY    . BREAST SURGERY    . CATARACT EXTRACTION W/PHACO Left 01/26/2017   Procedure: CATARACT EXTRACTION PHACO AND INTRAOCULAR LENS PLACEMENT (IOC);  Surgeon: Birder Robson, MD;  Location: [facility] ORS;  Service: Ophthalmology;  Laterality: Left;  [place] 53.7 AP% 20.3 CDE 10.90 Fluid pack lot # [id number]  . CATARACT EXTRACTION W/PHACO Right 02/16/2017   Procedure: CATARACT EXTRACTION PHACO AND INTRAOCULAR LENS PLACEMENT (IOC);  Surgeon: Birder Robson, MD;  Location: [facility] ORS;  Service: Ophthalmology;  Laterality:  Right;  [place] 00:55 AP% 22.5 CDE 12.43 Fluid pack lot # [id number]  . CESAREAN SECTION    . MASTECTOMY Right 1992  . TUBAL LIGATION       MEDICATIONS:  Prior to Admission medications   Medication Sig Start Date End Date Taking? Authorizing Provider  alendronate (FOSAMAX) 70 MG tablet Take 70 mg by mouth once a week. Take with a full glass of water on an empty stomach.   Yes [provider]  bisoprolol-hydrochlorothiazide (ZIAC) 2.5-6.25 MG per tablet Take 1 tablet by mouth daily. 5-6.25 tab   Yes [provider]  calcium carbonate (OSCAL) 1500 (600 Ca) MG TABS tablet Take 1,000 mg by mouth daily.   Yes [provider]  cholecalciferol (VITAMIN D) 1000 units tablet Take 1,000 Units by mouth daily.   Yes [provider]  diazepam (VALIUM) 2 MG tablet Take 2 mg by mouth every 6 (six) hours as needed for anxiety.   Yes [provider]  HYDROcodone-acetaminophen (NORCO/VICODIN) 5-325 MG tablet Take 1 tablet by mouth every 6 (six) hours as needed for moderate pain.   Yes [provider]  meclizine (ANTIVERT) 25 MG tablet Take 25 mg by mouth 3 (three) times daily as needed. For dizziness   Yes [provider]  ranitidine (ZANTAC) 150 MG tablet Take 150 mg by mouth 2 (two) times daily.   Yes [provider]  scopolamine (TRANSDERM-SCOP) 1 MG/3DAYS  Place 1 patch onto the skin every 3 (three) days. As needed   Yes [provider]  sertraline (ZOLOFT) 100 MG tablet Take 50 mg by mouth 2 (two) times daily.    Yes [provider]  simvastatin (ZOCOR) 40 MG tablet Take 40 mg by mouth every evening.   Yes [provider]  traZODone (DESYREL) 50 MG tablet Take 50 mg by mouth at bedtime.   Yes [provider]  ALPRAZolam (XANAX) 0.25 MG tablet Take 1 tablet (0.25 mg total) by mouth 3 (three) times daily as needed. For anxiety Patient not taking: Reported on 03/20/2018 01/19/12   Silvestre Moment, MD      ALLERGIES:  Allergies  Allergen Reactions  . Amoxicillin Swelling    Blisters.   . Aspirin Other (See Comments)    Stomach ulcers.  . Benadryl [Diphenhydramine Hcl] Other (See Comments)    Hyperactive.     SOCIAL HISTORY:  Social History   Socioeconomic History  . Marital status: Widowed    Spouse name: Not on file  . Number of children: Not on file  . Years of education: Not on file  . Highest education level: Not on file  Occupational History  . Not on file  Social Needs  . Financial resource strain: Not on file  . Food insecurity:    Worry: Not on file    Inability: Not on file  . Transportation needs:    Medical: Not on file    Non-medical: Not on file  Tobacco Use  . Smoking status: Never Smoker  . Smokeless tobacco: Never Used  Substance and Sexual Activity  . Alcohol use: No  . Drug use: Yes    Types: Hydrocodone, Benzodiazepines  . Sexual activity: Never  Lifestyle  . Physical activity:    Days per week: Not on file    Minutes per session: Not on file  . Stress: Not on file  Relationships  . Social connections:    Talks on phone: Not on file    Gets together: Not on file    Attends religious service: Not on file    Active member of club or organization: Not on file    Attends meetings of clubs or organizations: Not on file    Relationship status: Not on file  . Intimate partner violence:    Fear of current or ex partner: Not on file    Emotionally abused: Not on file    Physically abused: Not on file    Forced sexual activity: Not on file  Other Topics Concern  . Not on file  Social History Narrative  . Not on file    The patient currently resides (home / rehab facility / nursing home): Home The patient normally is (ambulatory / bedbound): Ambulatory   FAMILY HISTORY:  History reviewed. No pertinent family history.   REVIEW OF SYSTEMS:  Constitutional: denies weight loss, fever, chills, or sweats  Eyes: denies any other vision changes,  history of eye injury  ENT: denies sore throat, hearing problems  Respiratory: denies shortness of breath, wheezing  Cardiovascular: denies chest pain, palpitations  Gastrointestinal: see HPI Genitourinary: denies burning with urination or urinary frequency Musculoskeletal: denies any other joint pains or cramps  Skin: denies any other rashes or skin discolorations  Neurological: denies any other headache, dizziness, weakness  Psychiatric: denies any other depression, anxiety   All other review of systems were negative   VITAL SIGNS:  Temp:  [97.8 F (36.6 C)]  97.8 F (36.6 C) (06/09 0620) Pulse Rate:  [64-70] 67 (06/09 1000) Resp:  [12-21] 18 (06/09 1000) BP: (139-174)/(71-94) 139/78 (06/09 1000) SpO2:  [91 %-100 %] 94 % (06/09 1000) Weight:  [83.9 kg (185 lb)] 83.9 kg (185 lb) (06/09 0617)     Height: 5\' 7"  (170.2 cm) Weight: 83.9 kg (185 lb) BMI (Calculated): 28.97   INTAKE/OUTPUT:  This shift: Total I/O In: 1000 [I.V.:1000] Out: -   Last 2 shifts: @IOLAST2SHIFTS @   PHYSICAL EXAM:  Constitutional:  -- Normal body habitus  -- Awake, alert, and oriented x3  Eyes:  -- Pupils equally round and reactive to light  -- No scleral icterus  Ear, nose, and throat:  -- No jugular venous distension  Pulmonary:  -- No crackles  -- Equal breath sounds bilaterally -- Breathing non-labored at rest Cardiovascular:  -- S1, S2 present  -- No pericardial rubs Gastrointestinal:  -- Abdomen soft, moderate tenderness on the right upper quadrant, non-distended, no guarding or rebound tenderness -- No abdominal masses appreciated, pulsatile or otherwise  Musculoskeletal and Integumentary:  -- Wounds or skin discoloration: None appreciated -- Extremities: B/L UE and LE FROM, hands and feet warm, no edema  Neurologic:  -- Motor function: intact and symmetric -- Sensation: intact and symmetric   Labs:  CBC Latest Ref Rng & Units 03/20/2018 02/02/2012 02/01/2012  WBC 3.6 - 11.0 K/uL 6.0  5.2 4.8  Hemoglobin 12.0 - 16.0 g/dL 12.9 12.8 14.2  Hematocrit 35.0 - 47.0 % 39.0 39.6 42.8  Platelets 150 - 440 K/uL 136(L) 181 211   CMP Latest Ref Rng & Units 03/20/2018 02/02/2012 02/01/2012  Glucose 65 - 99 mg/dL 128(H) 99 111(H)  BUN 6 - 20 mg/dL 16 11 8   Creatinine 0.44 - 1.00 mg/dL 0.72 0.86 0.83  Sodium 135 - 145 mmol/L 138 141 139  Potassium 3.5 - 5.1 mmol/L 3.5 3.8 3.1(L)  Chloride 101 - 111 mmol/L 103 105 102  CO2 22 - 32 mmol/L 28 28 26   Calcium 8.9 - 10.3 mg/dL 8.6(L) 8.7 9.2  Total Protein 6.5 - 8.1 g/dL 6.8 - 7.3  Total Bilirubin 0.3 - 1.2 mg/dL 0.9 - 0.7  Alkaline Phos 38 - 126 U/L 70 - 171(H)  AST 15 - 41 U/L 17 - 14(L)  ALT 14 - 54 U/L 11(L) - 15   Imaging studies:  Abdominal ultrasound images reviewed.   EXAM: ULTRASOUND ABDOMEN LIMITED RIGHT UPPER QUADRANT  COMPARISON:  None.  FINDINGS: Gallbladder:  Multiple layering shadowing gallstones in the gallbladder measuring up to 1.1 cm in size. Mild diffuse gallbladder wall thickening. Mild gallbladder distention. Sonographic Percell Miller sign is present. No pericholecystic fluid.  Common bile duct:  Diameter: 8 mm  Liver:  Simple 2.2 x 1.7 x 2.4 cm left liver lobe cyst. Hyperechoic 1.5 x 1.3 x 1.5 cm inferior right liver lobe mass. Background liver parenchymal echogenicity and echotexture are normal. Portal vein is patent on color Doppler imaging with normal direction of blood flow towards the liver.  IMPRESSION: 1. Acute calculous cholecystitis. 2. Dilated common bile duct (8 mm diameter). Obstructing choledocholithiasis cannot be excluded. Consider MRI abdomen with MRCP without and with IV contrast for further characterization. 3. Indeterminate hypoechoic 1.5 cm inferior right liver lobe mass, which can also be further characterized at MRI abdomen without and with IV contrast.  Assessment/Plan:  84 y.o. female with acute cholecystitis, complicated by pertinent comorbidities including HTN,  hyperlipidemia and GERD. No history of cardiac disease, no history of lung disease.  Patient with clinical and radiographic findings of gallbladder disease. Early acute cholecystitis. No leukocytosis. Pain unable to resolve with morphine. Radiologist report CBD at 8 mm which may be normal for a patient of her age. There is no elevation bilirubin or liver enzymes. Patient and daughter were oriented about the images finding of acute cholecystitis. Patient oriented about surgical treatment, the recommendation of laparoscopic vs open cholecystectomy and the post op care.  Risk of surgery including: infection, bleeding, retained stone, injury to Common bile duct and other organs (small bowel, stomach, liver, pancreas) were discussed with patient and daughter. Patient and daughter agreed to proceed with surgery.   All of the above findings and recommendations were discussed with the patient and her family, and all of patient's and her family's questions were answered to their expressed satisfaction.  Arnold Long, MD

## 2018-03-20 NOTE — Progress Notes (Signed)
15 minute call floor.

## 2018-03-20 NOTE — Care Management Obs Status (Signed)
[place] NOTIFICATION   Patient Details  Name: Lindsey Barnes MRN: [id number] Date of Birth: 03-26-1933   Medicare Observation Status Notification Given:  Yes    Samah Lapiana A Talmadge Ganas, RN 03/20/2018, 5:08 PM

## 2018-03-21 ENCOUNTER — Other Ambulatory Visit: Payer: Self-pay

## 2018-03-21 ENCOUNTER — Encounter: Payer: Self-pay | Admitting: General Surgery

## 2018-03-21 LAB — COMPREHENSIVE METABOLIC PANEL
ALBUMIN: 2.9 g/dL — AB (ref 3.5–5.0)
ALT: 25 U/L (ref 14–54)
ANION GAP: 7 (ref 5–15)
AST: 39 U/L (ref 15–41)
Alkaline Phosphatase: 66 U/L (ref 38–126)
BUN: 16 mg/dL (ref 6–20)
CHLORIDE: 106 mmol/L (ref 101–111)
CO2: 25 mmol/L (ref 22–32)
Calcium: 7.6 mg/dL — ABNORMAL LOW (ref 8.9–10.3)
Creatinine, Ser: 0.72 mg/dL (ref 0.44–1.00)
GFR calc Af Amer: >60 mL/min (ref >60)
GFR calc non Af Amer: >60 mL/min (ref >60)
GLUCOSE: 150 mg/dL — AB (ref 65–99)
POTASSIUM: 3.6 mmol/L (ref 3.5–5.1)
SODIUM: 138 mmol/L (ref 135–145)
Total Bilirubin: 0.7 mg/dL (ref 0.3–1.2)
Total Protein: 5.6 g/dL — ABNORMAL LOW (ref 6.5–8.1)

## 2018-03-21 LAB — CBC
HEMATOCRIT: 33.6 % — AB (ref 35.0–47.0)
HEMOGLOBIN: 11.2 g/dL — AB (ref 12.0–16.0)
MCH: 29.9 pg (ref 26.0–34.0)
MCHC: 33.4 g/dL (ref 32.0–36.0)
MCV: 89.7 fL (ref 80.0–100.0)
Platelets: 112 10*3/uL — ABNORMAL LOW (ref 150–440)
RBC: 3.74 MIL/uL — ABNORMAL LOW (ref 3.80–5.20)
RDW: 14.5 % (ref 11.5–14.5)
WBC: 7.7 10*3/uL (ref 3.6–11.0)

## 2018-03-21 MED ORDER — PNEUMOCOCCAL VAC POLYVALENT 25 MCG/0.5ML IJ INJ: 0.5000 mL | INJECTION | INTRAMUSCULAR | Status: DC

## 2018-03-21 NOTE — Progress Notes (Signed)
S/p lap chole  Doing better but significant pain issues, still requiring IV meds Taking clears only  PE NAD Abd: incision c/d/i, no infection. Mild tenderness  A/P Doing well Not unexpected to have pain, she is debilitated and elderly Advance regular diet heplock DC in am

## 2018-03-21 NOTE — Care Management (Signed)
Message sent to UR RN regarding IV pain meds for pain and the fact that patient is 84 years old/debilitated per MD note. Patient currently at 25 hours Observations.

## 2018-03-22 LAB — SURGICAL PATHOLOGY

## 2018-03-22 MED ORDER — HYDROCODONE-ACETAMINOPHEN 5-325 MG PO TABS: 1.0000 | ORAL_TABLET | Freq: Four times a day (QID) | ORAL | 0 refills | Status: DC | PRN

## 2018-03-22 MED ORDER — HYDROCODONE-ACETAMINOPHEN 5-325 MG PO TABS: 1.0000 | ORAL_TABLET | ORAL | 0 refills | Status: DC | PRN

## 2018-03-22 NOTE — Progress Notes (Signed)
Lindsey Barnes to be D/C'd Home per MD order.  Discussed prescriptions and follow up appointments with the patient. Prescriptions given to patient, medication list explained in detail. Pt verbalized understanding.  Allergies as of 03/22/2018      Reactions   Amoxicillin Swelling   Blisters.   Aspirin Other (See Comments)   Stomach ulcers.   Benadryl [diphenhydramine Hcl] Other (See Comments)   Hyperactive.      Medication List    TAKE these medications   alendronate 70 MG tablet Commonly known as:  FOSAMAX Take 70 mg by mouth once a week. Take with a full glass of water on an empty stomach.   ALPRAZolam 0.25 MG tablet Commonly known as:  XANAX Take 1 tablet (0.25 mg total) by mouth 3 (three) times daily as needed. For anxiety   bisoprolol-hydrochlorothiazide 2.5-6.25 MG tablet Commonly known as:  ZIAC Take 1 tablet by mouth daily. 5-6.25 tab   calcium carbonate 1500 (600 Ca) MG Tabs tablet Commonly known as:  OSCAL Take 1,000 mg by mouth daily.   cholecalciferol 1000 units tablet Commonly known as:  VITAMIN D Take 1,000 Units by mouth daily.   diazepam 2 MG tablet Commonly known as:  VALIUM Take 2 mg by mouth every 6 (six) hours as needed for anxiety.   HYDROcodone-acetaminophen 5-325 MG tablet Commonly known as:  NORCO/VICODIN Take 1-2 tablets by mouth every 4 (four) hours as needed for moderate pain. What changed:    how much to take  when to take this   HYDROcodone-acetaminophen 5-325 MG tablet Commonly known as:  NORCO/VICODIN Take 1 tablet by mouth every 6 (six) hours as needed for moderate pain. What changed:  You were already taking a medication with the same name, and this prescription was added. Make sure you understand how and when to take each.   meclizine 25 MG tablet Commonly known as:  ANTIVERT Take 25 mg by mouth 3 (three) times daily as needed. For dizziness   ranitidine 150 MG tablet Commonly known as:  ZANTAC Take 150 mg by mouth 2  (two) times daily.   scopolamine 1 MG/3DAYS Commonly known as:  TRANSDERM-SCOP Place 1 patch onto the skin every 3 (three) days. As needed   sertraline 100 MG tablet Commonly known as:  ZOLOFT Take 50 mg by mouth 2 (two) times daily.   simvastatin 40 MG tablet Commonly known as:  ZOCOR Take 40 mg by mouth every evening.   traZODone 50 MG tablet Commonly known as:  DESYREL Take 50 mg by mouth at bedtime.       Vitals:   03/21/18 1926 03/22/18 0508  BP: 130/70 (!) 146/80  Pulse: 73 81  Resp: 18 18  Temp: 98 F (36.7 C) 98.5 F (36.9 C)  SpO2: 96% 94%    Skin clean, dry and intact without evidence of skin break down, no evidence of skin tears noted. IV catheter discontinued intact. Site without signs and symptoms of complications. Dressing and pressure applied. Pt denies pain at this time. No complaints noted.  An After Visit Summary was printed and given to the patient. Patient escorted via [place], and D/C home via private auto.  Sharalyn Ink

## 2018-03-22 NOTE — Care Management (Signed)
Weaned to room air per flow sheet. No identified needs.

## 2018-03-22 NOTE — Discharge Summary (Signed)
Patient ID: Lindsey Barnes MRN: [id number] DOB/AGE: 01-05-1933 84 y.o.  Admit date: 03/20/2018 Discharge date: 03/22/2018   Discharge Diagnoses:  Active Problems:   Acute cholecystitis   Procedures: laparoscopic cholecystectomy  Hospital Course: 84 year old female admitted with acute cholecystitis and she underwent an uneventful laparoscopic cholecystectomy.  Given her age she stay 2 nights in the hospital and was kept on antibiotics due to acute cholecystitis.  The time of discharge she was ambulating, tolerating regular diet she was afebrile.  Her physical exam at time of discharge showed a female in no acute distress.  Awake alert.  Abdomen incisions healing well without evidence of infection. No peritonitis Ext: warm and well perfused. Condition at DC was stable   Disposition: Home  Discharge Instructions    Call MD for:  difficulty breathing, headache or visual disturbances   Complete by:  As directed    Call MD for:  extreme fatigue   Complete by:  As directed    Call MD for:  hives   Complete by:  As directed    Call MD for:  persistant dizziness or light-headedness   Complete by:  As directed    Call MD for:  persistant nausea and vomiting   Complete by:  As directed    Call MD for:  redness, tenderness, or signs of infection (pain, swelling, redness, odor or green/yellow discharge around incision site)   Complete by:  As directed    Call MD for:  severe uncontrolled pain   Complete by:  As directed    Call MD for:  temperature >100.4   Complete by:  As directed    Diet - low sodium heart healthy   Complete by:  As directed    Discharge instructions   Complete by:  As directed    Shower starting today   Increase activity slowly   Complete by:  As directed    Lifting restrictions   Complete by:  As directed    20 lbs x 6 wks   No wound care   Complete by:  As directed      Allergies as of 03/22/2018      Reactions   Amoxicillin Swelling   Blisters.    Aspirin Other (See Comments)   Stomach ulcers.   Benadryl [diphenhydramine Hcl] Other (See Comments)   Hyperactive.      Medication List    TAKE these medications   alendronate 70 MG tablet Commonly known as:  FOSAMAX Take 70 mg by mouth once a week. Take with a full glass of water on an empty stomach.   ALPRAZolam 0.25 MG tablet Commonly known as:  XANAX Take 1 tablet (0.25 mg total) by mouth 3 (three) times daily as needed. For anxiety   bisoprolol-hydrochlorothiazide 2.5-6.25 MG tablet Commonly known as:  ZIAC Take 1 tablet by mouth daily. 5-6.25 tab   calcium carbonate 1500 (600 Ca) MG Tabs tablet Commonly known as:  OSCAL Take 1,000 mg by mouth daily.   cholecalciferol 1000 units tablet Commonly known as:  VITAMIN D Take 1,000 Units by mouth daily.   diazepam 2 MG tablet Commonly known as:  VALIUM Take 2 mg by mouth every 6 (six) hours as needed for anxiety.   HYDROcodone-acetaminophen 5-325 MG tablet Commonly known as:  NORCO/VICODIN Take 1-2 tablets by mouth every 4 (four) hours as needed for moderate pain. What changed:    how much to take  when to take this   HYDROcodone-acetaminophen 5-325 MG tablet Commonly  known as:  NORCO/VICODIN Take 1 tablet by mouth every 6 (six) hours as needed for moderate pain. What changed:  You were already taking a medication with the same name, and this prescription was added. Make sure you understand how and when to take each.   meclizine 25 MG tablet Commonly known as:  ANTIVERT Take 25 mg by mouth 3 (three) times daily as needed. For dizziness   ranitidine 150 MG tablet Commonly known as:  ZANTAC Take 150 mg by mouth 2 (two) times daily.   scopolamine 1 MG/3DAYS Commonly known as:  TRANSDERM-SCOP Place 1 patch onto the skin every 3 (three) days. As needed   sertraline 100 MG tablet Commonly known as:  ZOLOFT Take 50 mg by mouth 2 (two) times daily.   simvastatin 40 MG tablet Commonly known as:  ZOCOR Take  40 mg by mouth every evening.   traZODone 50 MG tablet Commonly known as:  DESYREL Take 50 mg by mouth at bedtime.      Follow-up Information    Florene Glen, MD. Go on 04/06/2018.   Specialty:  General Surgery Why:  Dr. Burt Knack, Wednesday, 6/26 at 4:00 p.m.  [phone] Contact information: [place] [address] [phone]            Caroleen Hamman, MD FACS

## 2018-04-01 ENCOUNTER — Other Ambulatory Visit: Payer: Self-pay

## 2018-04-01 DIAGNOSIS — F329 Major depressive disorder, single episode, unspecified: Secondary | ICD-10-CM | POA: Insufficient documentation

## 2018-04-01 DIAGNOSIS — F32A Depression, unspecified: Secondary | ICD-10-CM | POA: Insufficient documentation

## 2018-04-01 DIAGNOSIS — M810 Age-related osteoporosis without current pathological fracture: Secondary | ICD-10-CM | POA: Insufficient documentation

## 2018-04-01 DIAGNOSIS — K219 Gastro-esophageal reflux disease without esophagitis: Secondary | ICD-10-CM | POA: Insufficient documentation

## 2018-04-06 ENCOUNTER — Encounter: Payer: Self-pay | Admitting: Surgery

## 2018-04-06 ENCOUNTER — Ambulatory Visit (INDEPENDENT_AMBULATORY_CARE_PROVIDER_SITE_OTHER): Payer: PPO | Admitting: Surgery

## 2018-04-06 VITALS — BP 143/79 | HR 69 | Temp 97.8°F | Ht 67.0 in | Wt 182.2 lb

## 2018-04-06 DIAGNOSIS — K8000 Calculus of gallbladder with acute cholecystitis without obstruction: Secondary | ICD-10-CM

## 2018-04-06 NOTE — Progress Notes (Signed)
Outpatient postop visit  04/06/2018  Lindsey Barnes is an 84 y.o. female.    Procedure: Laparoscopic cholecystectomy by Dr. Peyton Najjar  CC: Minimal pain not taking narcotics  HPI this patient underwent a laparoscopic cholecystectomy by Dr. Windell Moment Patient has no complaints except for minimal pain for which she only takes Tylenol.  No nausea vomiting fevers or chills Medications reviewed.    Physical Exam:  There were no vitals taken for this visit.    PE: Ox with a cane no icterus no jaundice abdomen soft nontender Dermabond in place wounds clean    Assessment/Plan:  This a patient who underwent a laparoscopic cholecystectomy by Dr. Windell Moment.  Pathology is reviewed and confirms cholecystitis.  Patient doing very well at this time.  Patient doing very well at this time recommend follow-up on an as-needed basis.  Florene Glen, MD, FACS

## 2018-04-06 NOTE — Patient Instructions (Signed)

## 2018-04-07 ENCOUNTER — Encounter: Payer: Self-pay | Admitting: Surgery

## 2018-08-12 DIAGNOSIS — I10 Essential (primary) hypertension: Secondary | ICD-10-CM | POA: Diagnosis not present

## 2018-08-12 DIAGNOSIS — K219 Gastro-esophageal reflux disease without esophagitis: Secondary | ICD-10-CM | POA: Diagnosis not present

## 2018-08-12 DIAGNOSIS — F329 Major depressive disorder, single episode, unspecified: Secondary | ICD-10-CM | POA: Diagnosis not present

## 2018-08-12 DIAGNOSIS — E782 Mixed hyperlipidemia: Secondary | ICD-10-CM | POA: Diagnosis not present

## 2018-08-12 DIAGNOSIS — F419 Anxiety disorder, unspecified: Secondary | ICD-10-CM | POA: Diagnosis not present

## 2018-09-08 DIAGNOSIS — J209 Acute bronchitis, unspecified: Secondary | ICD-10-CM | POA: Diagnosis not present

## 2018-09-12 DIAGNOSIS — H04123 Dry eye syndrome of bilateral lacrimal glands: Secondary | ICD-10-CM | POA: Diagnosis not present

## 2018-09-14 DIAGNOSIS — J209 Acute bronchitis, unspecified: Secondary | ICD-10-CM | POA: Diagnosis not present

## 2019-03-01 DIAGNOSIS — X32XXXA Exposure to sunlight, initial encounter: Secondary | ICD-10-CM | POA: Diagnosis not present

## 2019-03-01 DIAGNOSIS — D2272 Melanocytic nevi of left lower limb, including hip: Secondary | ICD-10-CM | POA: Diagnosis not present

## 2019-03-01 DIAGNOSIS — L820 Inflamed seborrheic keratosis: Secondary | ICD-10-CM | POA: Diagnosis not present

## 2019-03-01 DIAGNOSIS — L570 Actinic keratosis: Secondary | ICD-10-CM | POA: Diagnosis not present

## 2019-03-01 DIAGNOSIS — R208 Other disturbances of skin sensation: Secondary | ICD-10-CM | POA: Diagnosis not present

## 2019-03-01 DIAGNOSIS — Z85828 Personal history of other malignant neoplasm of skin: Secondary | ICD-10-CM | POA: Diagnosis not present

## 2019-03-01 DIAGNOSIS — Z08 Encounter for follow-up examination after completed treatment for malignant neoplasm: Secondary | ICD-10-CM | POA: Diagnosis not present

## 2019-03-01 DIAGNOSIS — D2261 Melanocytic nevi of right upper limb, including shoulder: Secondary | ICD-10-CM | POA: Diagnosis not present

## 2019-03-01 DIAGNOSIS — D2262 Melanocytic nevi of left upper limb, including shoulder: Secondary | ICD-10-CM | POA: Diagnosis not present

## 2019-03-22 DIAGNOSIS — M810 Age-related osteoporosis without current pathological fracture: Secondary | ICD-10-CM | POA: Diagnosis not present

## 2019-03-31 DIAGNOSIS — M810 Age-related osteoporosis without current pathological fracture: Secondary | ICD-10-CM | POA: Diagnosis not present

## 2019-04-03 DIAGNOSIS — M810 Age-related osteoporosis without current pathological fracture: Secondary | ICD-10-CM | POA: Diagnosis not present

## 2019-04-05 DIAGNOSIS — M810 Age-related osteoporosis without current pathological fracture: Secondary | ICD-10-CM | POA: Diagnosis not present

## 2019-04-07 DIAGNOSIS — K449 Diaphragmatic hernia without obstruction or gangrene: Secondary | ICD-10-CM | POA: Diagnosis not present

## 2019-04-07 DIAGNOSIS — R1084 Generalized abdominal pain: Secondary | ICD-10-CM | POA: Diagnosis not present

## 2019-04-10 DIAGNOSIS — Z0000 Encounter for general adult medical examination without abnormal findings: Secondary | ICD-10-CM | POA: Diagnosis not present

## 2019-04-10 DIAGNOSIS — Z853 Personal history of malignant neoplasm of breast: Secondary | ICD-10-CM | POA: Diagnosis not present

## 2019-04-10 DIAGNOSIS — F329 Major depressive disorder, single episode, unspecified: Secondary | ICD-10-CM | POA: Diagnosis not present

## 2019-04-10 DIAGNOSIS — E782 Mixed hyperlipidemia: Secondary | ICD-10-CM | POA: Diagnosis not present

## 2019-04-10 DIAGNOSIS — I10 Essential (primary) hypertension: Secondary | ICD-10-CM | POA: Diagnosis not present

## 2019-04-10 DIAGNOSIS — F419 Anxiety disorder, unspecified: Secondary | ICD-10-CM | POA: Diagnosis not present

## 2019-04-10 DIAGNOSIS — M810 Age-related osteoporosis without current pathological fracture: Secondary | ICD-10-CM | POA: Diagnosis not present

## 2019-04-10 DIAGNOSIS — Z1331 Encounter for screening for depression: Secondary | ICD-10-CM | POA: Diagnosis not present

## 2019-04-20 ENCOUNTER — Other Ambulatory Visit: Payer: Self-pay | Admitting: Family Medicine

## 2019-04-20 DIAGNOSIS — Z1231 Encounter for screening mammogram for malignant neoplasm of breast: Secondary | ICD-10-CM

## 2019-05-26 ENCOUNTER — Other Ambulatory Visit: Payer: Self-pay

## 2019-05-26 ENCOUNTER — Ambulatory Visit
Admission: RE | Admit: 2019-05-26 | Discharge: 2019-05-26 | Disposition: A | Discharge status: Home / Self Care | Payer: PPO | Source: Ambulatory Visit | Attending: Family Medicine | Admitting: Family Medicine

## 2019-05-26 DIAGNOSIS — Z1231 Encounter for screening mammogram for malignant neoplasm of breast: Secondary | ICD-10-CM | POA: Diagnosis not present

## 2019-08-21 DIAGNOSIS — H8112 Benign paroxysmal vertigo, left ear: Secondary | ICD-10-CM | POA: Diagnosis not present

## 2019-08-21 DIAGNOSIS — R42 Dizziness and giddiness: Secondary | ICD-10-CM | POA: Diagnosis not present

## 2019-08-28 DIAGNOSIS — R58 Hemorrhage, not elsewhere classified: Secondary | ICD-10-CM | POA: Diagnosis not present

## 2019-08-28 DIAGNOSIS — L538 Other specified erythematous conditions: Secondary | ICD-10-CM | POA: Diagnosis not present

## 2019-08-28 DIAGNOSIS — L820 Inflamed seborrheic keratosis: Secondary | ICD-10-CM | POA: Diagnosis not present

## 2019-09-29 DIAGNOSIS — T1502XA Foreign body in cornea, left eye, initial encounter: Secondary | ICD-10-CM | POA: Diagnosis not present

## 2019-09-29 DIAGNOSIS — Z961 Presence of intraocular lens: Secondary | ICD-10-CM | POA: Diagnosis not present

## 2019-10-10 DIAGNOSIS — M810 Age-related osteoporosis without current pathological fracture: Secondary | ICD-10-CM | POA: Diagnosis not present

## 2019-10-11 DIAGNOSIS — M25562 Pain in left knee: Secondary | ICD-10-CM | POA: Diagnosis not present

## 2019-10-11 DIAGNOSIS — F329 Major depressive disorder, single episode, unspecified: Secondary | ICD-10-CM | POA: Diagnosis not present

## 2019-10-11 DIAGNOSIS — J209 Acute bronchitis, unspecified: Secondary | ICD-10-CM | POA: Diagnosis not present

## 2019-10-11 DIAGNOSIS — F419 Anxiety disorder, unspecified: Secondary | ICD-10-CM | POA: Diagnosis not present

## 2019-10-11 DIAGNOSIS — I10 Essential (primary) hypertension: Secondary | ICD-10-CM | POA: Diagnosis not present

## 2019-10-11 DIAGNOSIS — M810 Age-related osteoporosis without current pathological fracture: Secondary | ICD-10-CM | POA: Diagnosis not present

## 2019-10-11 DIAGNOSIS — E782 Mixed hyperlipidemia: Secondary | ICD-10-CM | POA: Diagnosis not present

## 2020-01-11 DIAGNOSIS — I10 Essential (primary) hypertension: Secondary | ICD-10-CM | POA: Diagnosis not present

## 2020-01-11 DIAGNOSIS — E782 Mixed hyperlipidemia: Secondary | ICD-10-CM | POA: Diagnosis not present

## 2020-01-11 DIAGNOSIS — R0602 Shortness of breath: Secondary | ICD-10-CM | POA: Diagnosis not present

## 2020-01-31 DIAGNOSIS — R0602 Shortness of breath: Secondary | ICD-10-CM | POA: Diagnosis not present

## 2020-02-13 DIAGNOSIS — R0602 Shortness of breath: Secondary | ICD-10-CM | POA: Diagnosis not present

## 2020-02-19 DIAGNOSIS — E782 Mixed hyperlipidemia: Secondary | ICD-10-CM | POA: Diagnosis not present

## 2020-02-19 DIAGNOSIS — R0602 Shortness of breath: Secondary | ICD-10-CM | POA: Diagnosis not present

## 2020-02-19 DIAGNOSIS — I10 Essential (primary) hypertension: Secondary | ICD-10-CM | POA: Diagnosis not present

## 2020-02-28 DIAGNOSIS — L570 Actinic keratosis: Secondary | ICD-10-CM | POA: Diagnosis not present

## 2020-02-28 DIAGNOSIS — D2271 Melanocytic nevi of right lower limb, including hip: Secondary | ICD-10-CM | POA: Diagnosis not present

## 2020-02-28 DIAGNOSIS — D225 Melanocytic nevi of trunk: Secondary | ICD-10-CM | POA: Diagnosis not present

## 2020-02-28 DIAGNOSIS — D2261 Melanocytic nevi of right upper limb, including shoulder: Secondary | ICD-10-CM | POA: Diagnosis not present

## 2020-02-28 DIAGNOSIS — D2262 Melanocytic nevi of left upper limb, including shoulder: Secondary | ICD-10-CM | POA: Diagnosis not present

## 2020-02-28 DIAGNOSIS — Z85828 Personal history of other malignant neoplasm of skin: Secondary | ICD-10-CM | POA: Diagnosis not present

## 2020-02-28 DIAGNOSIS — L821 Other seborrheic keratosis: Secondary | ICD-10-CM | POA: Diagnosis not present

## 2020-02-28 DIAGNOSIS — X32XXXA Exposure to sunlight, initial encounter: Secondary | ICD-10-CM | POA: Diagnosis not present

## 2020-03-06 DIAGNOSIS — R0600 Dyspnea, unspecified: Secondary | ICD-10-CM | POA: Diagnosis not present

## 2020-03-06 DIAGNOSIS — Z01818 Encounter for other preprocedural examination: Secondary | ICD-10-CM | POA: Diagnosis not present

## 2020-03-27 DIAGNOSIS — E782 Mixed hyperlipidemia: Secondary | ICD-10-CM | POA: Diagnosis not present

## 2020-03-27 DIAGNOSIS — I10 Essential (primary) hypertension: Secondary | ICD-10-CM | POA: Diagnosis not present

## 2020-03-29 DIAGNOSIS — I10 Essential (primary) hypertension: Secondary | ICD-10-CM | POA: Diagnosis not present

## 2020-04-05 DIAGNOSIS — M810 Age-related osteoporosis without current pathological fracture: Secondary | ICD-10-CM | POA: Diagnosis not present

## 2020-04-10 DIAGNOSIS — M810 Age-related osteoporosis without current pathological fracture: Secondary | ICD-10-CM | POA: Diagnosis not present

## 2020-04-12 DIAGNOSIS — M810 Age-related osteoporosis without current pathological fracture: Secondary | ICD-10-CM | POA: Diagnosis not present

## 2020-04-12 DIAGNOSIS — Z0000 Encounter for general adult medical examination without abnormal findings: Secondary | ICD-10-CM | POA: Diagnosis not present

## 2020-04-12 DIAGNOSIS — Z853 Personal history of malignant neoplasm of breast: Secondary | ICD-10-CM | POA: Diagnosis not present

## 2020-04-12 DIAGNOSIS — F329 Major depressive disorder, single episode, unspecified: Secondary | ICD-10-CM | POA: Diagnosis not present

## 2020-04-12 DIAGNOSIS — R5383 Other fatigue: Secondary | ICD-10-CM | POA: Diagnosis not present

## 2020-04-12 DIAGNOSIS — E782 Mixed hyperlipidemia: Secondary | ICD-10-CM | POA: Diagnosis not present

## 2020-04-12 DIAGNOSIS — I10 Essential (primary) hypertension: Secondary | ICD-10-CM | POA: Diagnosis not present

## 2020-04-12 DIAGNOSIS — Z1331 Encounter for screening for depression: Secondary | ICD-10-CM | POA: Diagnosis not present

## 2020-05-16 IMAGING — US US ABDOMEN LIMITED
1 series · 14 of 25 positions shown · non-contrast
Comparison: None.

CLINICAL DATA: Upper abdominal pain for 8 hours

EXAM:
ULTRASOUND ABDOMEN LIMITED RIGHT UPPER QUADRANT

[Series 1: us abdomen limited · 14 of 44 slices shown]
[im 1/44]
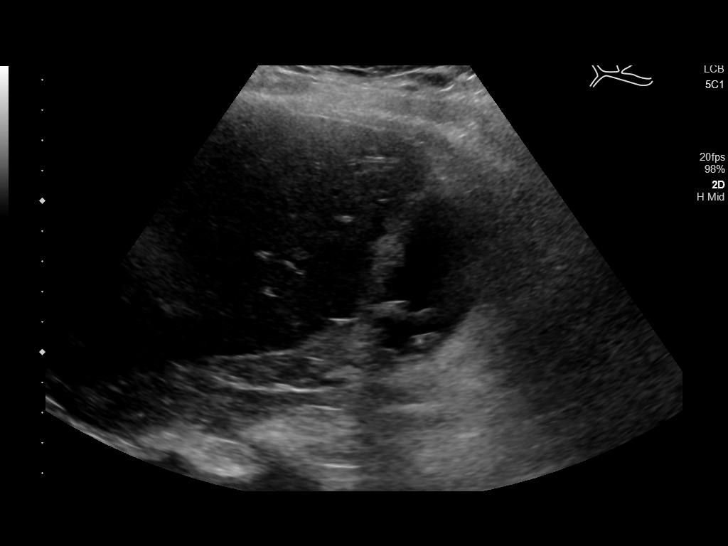
[im 4/44]
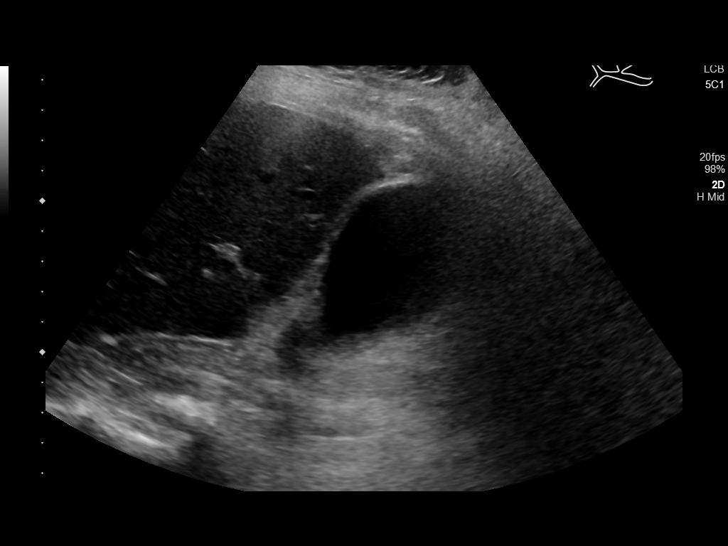
[im 8/44]
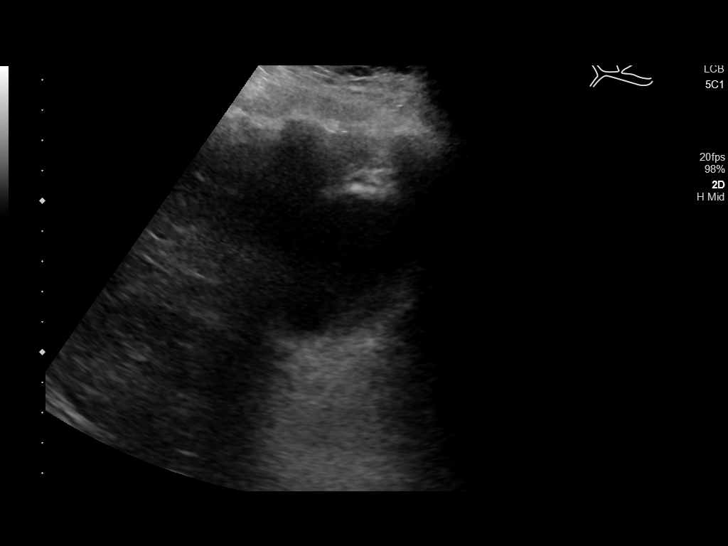
[im 11/44]
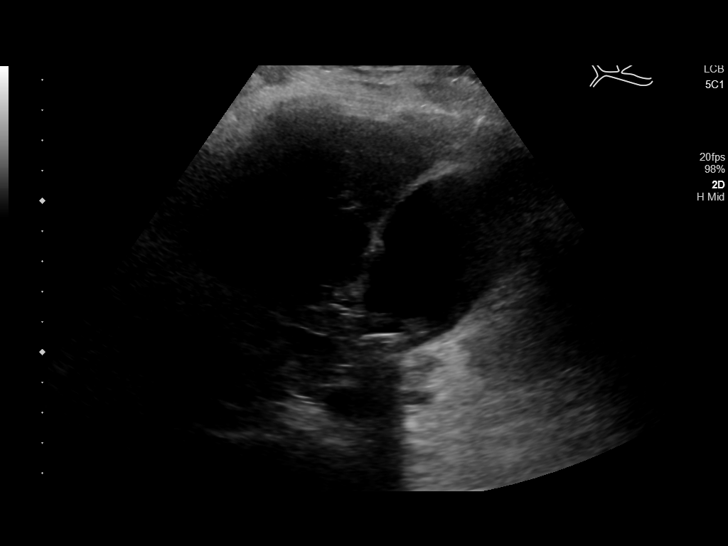
[im 15/44]
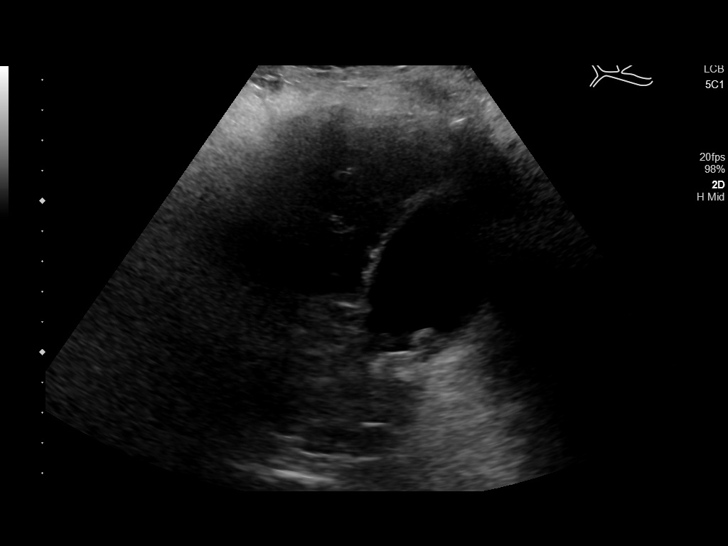
[im 17/44]
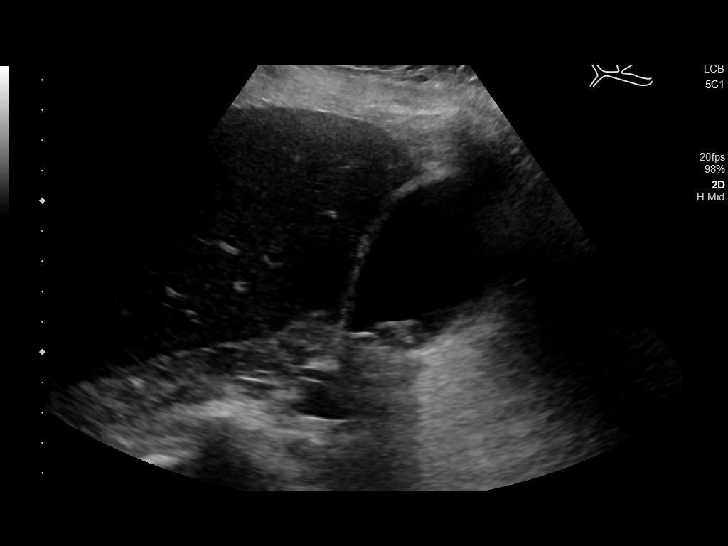
[im 20/44]
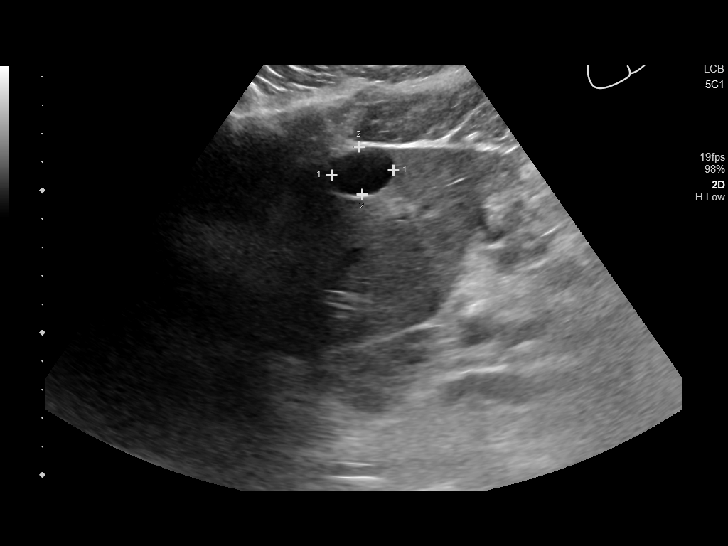
[im 24/44]
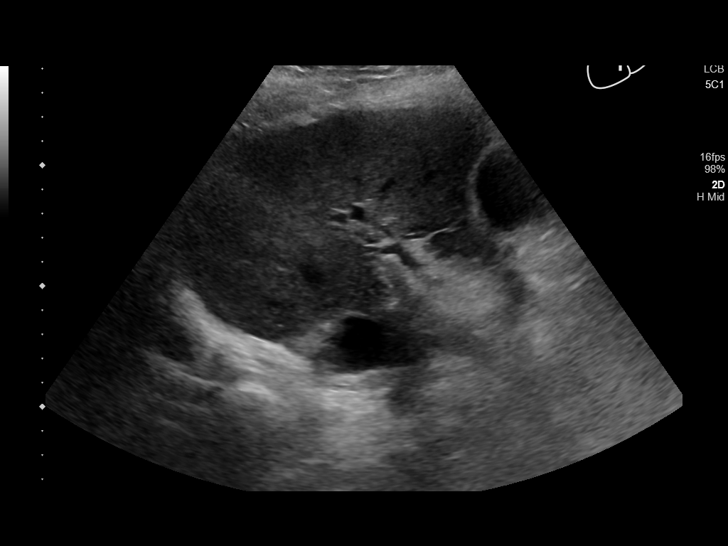
[im 27/44]
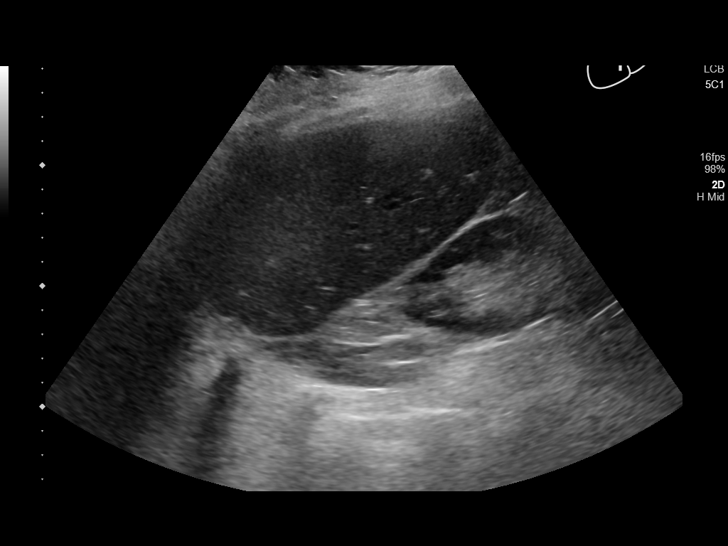
[im 29/44]
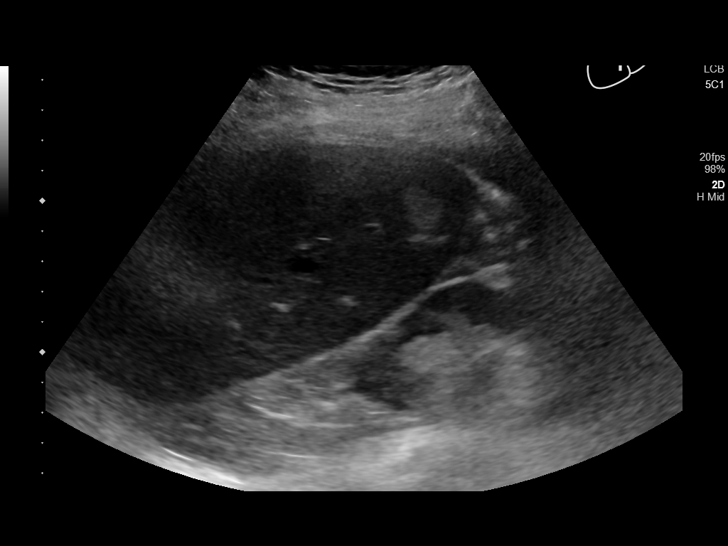
[im 33/44]
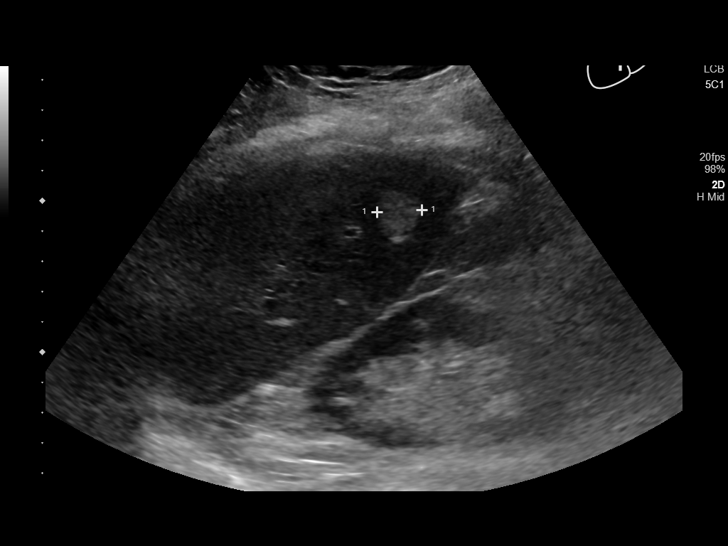
[im 36/44]
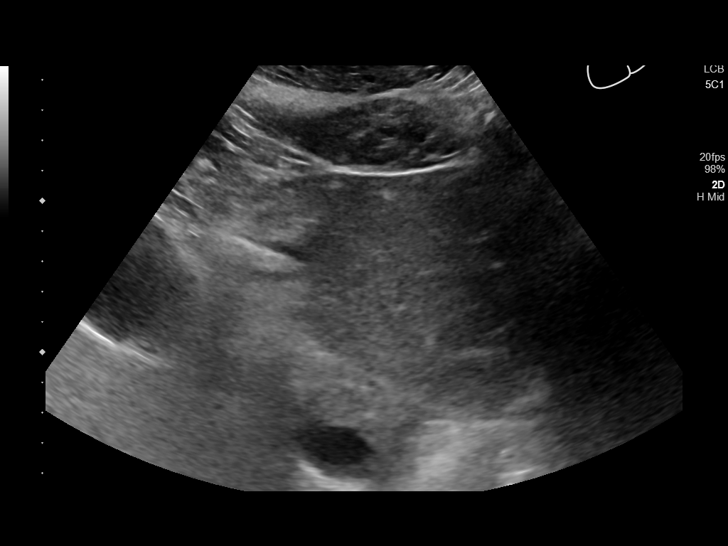
[im 40/44]
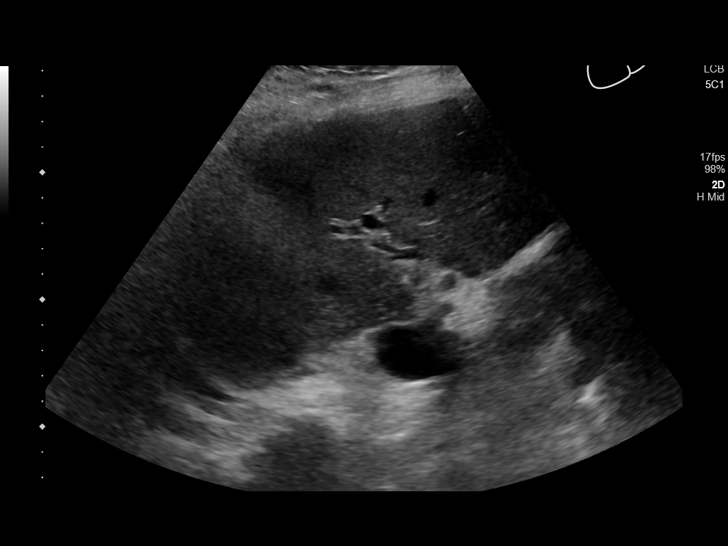
[im 44/44]
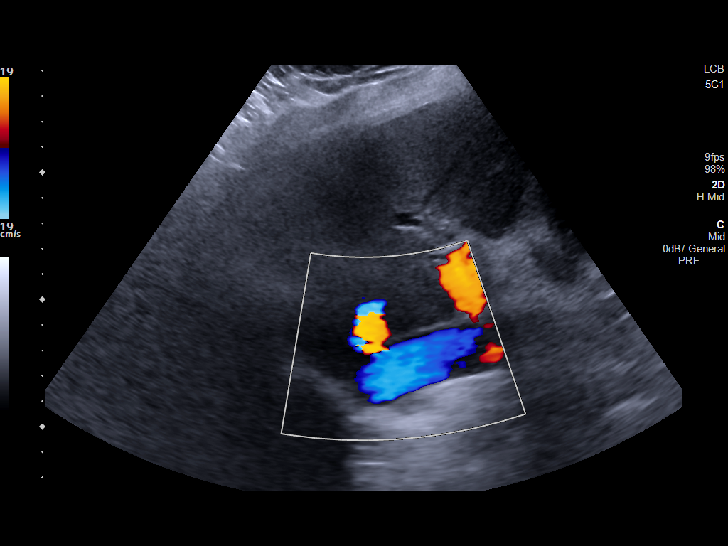

[14 of 25 positions shown; findings below may reference images not displayed]

FINDINGS: Gallbladder:

Multiple layering shadowing gallstones in the gallbladder measuring
up to 1.1 cm in size. Mild diffuse gallbladder wall thickening. Mild
gallbladder distention. Sonographic Murphy sign is present. No
pericholecystic fluid.

Common bile duct:

Diameter: 8 mm

Liver:

Simple 2.2 x 1.7 x 2.4 cm left liver lobe cyst. Hyperechoic 1.5 x
1.3 x 1.5 cm inferior right liver lobe mass. Background liver
parenchymal echogenicity and echotexture are normal. Portal vein is
patent on color Doppler imaging with normal direction of blood flow
towards the liver.
IMPRESSION: 1. Acute calculous cholecystitis.
2. Dilated common bile duct (8 mm diameter). Obstructing
choledocholithiasis cannot be excluded. Consider MRI abdomen with
MRCP without and with IV contrast for further characterization.
3. Indeterminate hypoechoic 1.5 cm inferior right liver lobe mass,
which can also be further characterized at MRI abdomen without and
with IV contrast.

## 2020-05-20 DIAGNOSIS — H26492 Other secondary cataract, left eye: Secondary | ICD-10-CM | POA: Diagnosis not present

## 2020-05-30 DIAGNOSIS — E782 Mixed hyperlipidemia: Secondary | ICD-10-CM | POA: Diagnosis not present

## 2020-05-30 DIAGNOSIS — R251 Tremor, unspecified: Secondary | ICD-10-CM | POA: Diagnosis not present

## 2020-05-30 DIAGNOSIS — I10 Essential (primary) hypertension: Secondary | ICD-10-CM | POA: Diagnosis not present

## 2020-06-14 DIAGNOSIS — L918 Other hypertrophic disorders of the skin: Secondary | ICD-10-CM | POA: Diagnosis not present

## 2020-06-19 DIAGNOSIS — R5383 Other fatigue: Secondary | ICD-10-CM | POA: Diagnosis not present

## 2020-06-19 DIAGNOSIS — I10 Essential (primary) hypertension: Secondary | ICD-10-CM | POA: Diagnosis not present

## 2020-06-19 DIAGNOSIS — M810 Age-related osteoporosis without current pathological fracture: Secondary | ICD-10-CM | POA: Diagnosis not present

## 2020-06-19 DIAGNOSIS — E782 Mixed hyperlipidemia: Secondary | ICD-10-CM | POA: Diagnosis not present

## 2020-07-30 DIAGNOSIS — H903 Sensorineural hearing loss, bilateral: Secondary | ICD-10-CM | POA: Diagnosis not present

## 2020-07-31 DIAGNOSIS — H9121 Sudden idiopathic hearing loss, right ear: Secondary | ICD-10-CM | POA: Diagnosis not present

## 2020-08-29 DIAGNOSIS — H903 Sensorineural hearing loss, bilateral: Secondary | ICD-10-CM | POA: Diagnosis not present

## 2020-08-29 DIAGNOSIS — H9121 Sudden idiopathic hearing loss, right ear: Secondary | ICD-10-CM | POA: Diagnosis not present

## 2020-10-14 DIAGNOSIS — E782 Mixed hyperlipidemia: Secondary | ICD-10-CM | POA: Diagnosis not present

## 2020-10-14 DIAGNOSIS — I10 Essential (primary) hypertension: Secondary | ICD-10-CM | POA: Diagnosis not present

## 2020-10-14 DIAGNOSIS — G8929 Other chronic pain: Secondary | ICD-10-CM | POA: Diagnosis not present

## 2020-10-14 DIAGNOSIS — M545 Low back pain, unspecified: Secondary | ICD-10-CM | POA: Diagnosis not present

## 2020-10-14 DIAGNOSIS — F32A Depression, unspecified: Secondary | ICD-10-CM | POA: Diagnosis not present

## 2020-10-14 DIAGNOSIS — F419 Anxiety disorder, unspecified: Secondary | ICD-10-CM | POA: Diagnosis not present

## 2020-10-14 DIAGNOSIS — R251 Tremor, unspecified: Secondary | ICD-10-CM | POA: Diagnosis not present

## 2020-10-15 DIAGNOSIS — M810 Age-related osteoporosis without current pathological fracture: Secondary | ICD-10-CM | POA: Diagnosis not present

## 2020-10-21 DIAGNOSIS — F32A Depression, unspecified: Secondary | ICD-10-CM | POA: Diagnosis not present

## 2020-10-21 DIAGNOSIS — I10 Essential (primary) hypertension: Secondary | ICD-10-CM | POA: Diagnosis not present

## 2020-10-21 DIAGNOSIS — E782 Mixed hyperlipidemia: Secondary | ICD-10-CM | POA: Diagnosis not present

## 2020-11-14 DIAGNOSIS — I10 Essential (primary) hypertension: Secondary | ICD-10-CM | POA: Diagnosis not present

## 2020-11-26 DIAGNOSIS — E876 Hypokalemia: Secondary | ICD-10-CM | POA: Diagnosis not present

## 2020-11-26 DIAGNOSIS — I10 Essential (primary) hypertension: Secondary | ICD-10-CM | POA: Diagnosis not present

## 2020-11-26 DIAGNOSIS — E782 Mixed hyperlipidemia: Secondary | ICD-10-CM | POA: Diagnosis not present

## 2020-11-26 DIAGNOSIS — Z23 Encounter for immunization: Secondary | ICD-10-CM | POA: Diagnosis not present

## 2020-11-26 DIAGNOSIS — I38 Endocarditis, valve unspecified: Secondary | ICD-10-CM | POA: Diagnosis not present

## 2020-12-09 DIAGNOSIS — E876 Hypokalemia: Secondary | ICD-10-CM | POA: Diagnosis not present

## 2020-12-31 DIAGNOSIS — E876 Hypokalemia: Secondary | ICD-10-CM | POA: Diagnosis not present

## 2020-12-31 DIAGNOSIS — I10 Essential (primary) hypertension: Secondary | ICD-10-CM | POA: Diagnosis not present

## 2021-01-17 ENCOUNTER — Observation Stay: Payer: PPO

## 2021-01-17 ENCOUNTER — Observation Stay
Admission: EM | Admit: 2021-01-17 | Discharge: 2021-01-19 | Disposition: A | Discharge status: Home / Self Care | Payer: PPO | Attending: Internal Medicine | Admitting: Internal Medicine

## 2021-01-17 ENCOUNTER — Emergency Department: Payer: PPO

## 2021-01-17 ENCOUNTER — Other Ambulatory Visit: Payer: Self-pay

## 2021-01-17 ENCOUNTER — Observation Stay
Admit: 2021-01-17 | Discharge: 2021-01-17 | Disposition: A | Discharge status: Home / Self Care | Payer: PPO | Attending: Internal Medicine | Admitting: Internal Medicine

## 2021-01-17 DIAGNOSIS — Z20822 Contact with and (suspected) exposure to covid-19: Secondary | ICD-10-CM | POA: Insufficient documentation

## 2021-01-17 DIAGNOSIS — M6281 Muscle weakness (generalized): Secondary | ICD-10-CM | POA: Insufficient documentation

## 2021-01-17 DIAGNOSIS — R29898 Other symptoms and signs involving the musculoskeletal system: Secondary | ICD-10-CM

## 2021-01-17 DIAGNOSIS — Z2839 Other underimmunization status: Secondary | ICD-10-CM | POA: Insufficient documentation

## 2021-01-17 DIAGNOSIS — Z886 Allergy status to analgesic agent status: Secondary | ICD-10-CM | POA: Insufficient documentation

## 2021-01-17 DIAGNOSIS — I10 Essential (primary) hypertension: Secondary | ICD-10-CM | POA: Diagnosis not present

## 2021-01-17 DIAGNOSIS — R519 Headache, unspecified: Secondary | ICD-10-CM | POA: Diagnosis not present

## 2021-01-17 DIAGNOSIS — R531 Weakness: Secondary | ICD-10-CM | POA: Diagnosis not present

## 2021-01-17 DIAGNOSIS — S22080A Wedge compression fracture of T11-T12 vertebra, initial encounter for closed fracture: Secondary | ICD-10-CM | POA: Diagnosis present

## 2021-01-17 DIAGNOSIS — I6782 Cerebral ischemia: Secondary | ICD-10-CM | POA: Diagnosis not present

## 2021-01-17 DIAGNOSIS — G459 Transient cerebral ischemic attack, unspecified: Principal | ICD-10-CM | POA: Insufficient documentation

## 2021-01-17 DIAGNOSIS — W19XXXA Unspecified fall, initial encounter: Secondary | ICD-10-CM

## 2021-01-17 DIAGNOSIS — W1830XA Fall on same level, unspecified, initial encounter: Secondary | ICD-10-CM | POA: Diagnosis not present

## 2021-01-17 DIAGNOSIS — Z880 Allergy status to penicillin: Secondary | ICD-10-CM | POA: Diagnosis not present

## 2021-01-17 DIAGNOSIS — D320 Benign neoplasm of cerebral meninges: Secondary | ICD-10-CM | POA: Diagnosis not present

## 2021-01-17 DIAGNOSIS — I63522 Cerebral infarction due to unspecified occlusion or stenosis of left anterior cerebral artery: Secondary | ICD-10-CM

## 2021-01-17 DIAGNOSIS — E785 Hyperlipidemia, unspecified: Secondary | ICD-10-CM | POA: Diagnosis present

## 2021-01-17 DIAGNOSIS — I639 Cerebral infarction, unspecified: Secondary | ICD-10-CM | POA: Insufficient documentation

## 2021-01-17 DIAGNOSIS — F32A Depression, unspecified: Secondary | ICD-10-CM | POA: Diagnosis present

## 2021-01-17 DIAGNOSIS — F419 Anxiety disorder, unspecified: Secondary | ICD-10-CM | POA: Diagnosis not present

## 2021-01-17 DIAGNOSIS — G4700 Insomnia, unspecified: Secondary | ICD-10-CM | POA: Diagnosis present

## 2021-01-17 DIAGNOSIS — Z043 Encounter for examination and observation following other accident: Secondary | ICD-10-CM | POA: Diagnosis not present

## 2021-01-17 DIAGNOSIS — Z853 Personal history of malignant neoplasm of breast: Secondary | ICD-10-CM | POA: Diagnosis not present

## 2021-01-17 DIAGNOSIS — Z888 Allergy status to other drugs, medicaments and biological substances status: Secondary | ICD-10-CM | POA: Diagnosis not present

## 2021-01-17 DIAGNOSIS — Z79899 Other long term (current) drug therapy: Secondary | ICD-10-CM | POA: Insufficient documentation

## 2021-01-17 DIAGNOSIS — M545 Low back pain, unspecified: Secondary | ICD-10-CM | POA: Diagnosis present

## 2021-01-17 DIAGNOSIS — R0602 Shortness of breath: Secondary | ICD-10-CM | POA: Diagnosis not present

## 2021-01-17 LAB — RESP PANEL BY RT-PCR (FLU A&B, COVID) ARPGX2
Influenza A by PCR: NEGATIVE
Influenza B by PCR: NEGATIVE
SARS Coronavirus 2 by RT PCR: NEGATIVE

## 2021-01-17 LAB — BASIC METABOLIC PANEL
Anion gap: 7 (ref 5–15)
BUN: 20 mg/dL (ref 8–23)
CO2: 27 mmol/L (ref 22–32)
Calcium: 9.1 mg/dL (ref 8.9–10.3)
Chloride: 106 mmol/L (ref 98–111)
Creatinine, Ser: 0.81 mg/dL (ref 0.44–1.00)
GFR, Estimated: >60 mL/min (ref >60)
Glucose, Bld: 106 mg/dL — ABNORMAL HIGH (ref 70–99)
Potassium: 3.6 mmol/L (ref 3.5–5.1)
Sodium: 140 mmol/L (ref 135–145)

## 2021-01-17 LAB — URINALYSIS, COMPLETE (UACMP) WITH MICROSCOPIC
Bacteria, UA: NONE SEEN
Bilirubin Urine: NEGATIVE
Glucose, UA: NEGATIVE mg/dL
Ketones, ur: NEGATIVE mg/dL
Nitrite: NEGATIVE
Protein, ur: NEGATIVE mg/dL
Specific Gravity, Urine: 1.02 (ref 1.005–1.030)
pH: 7 (ref 5.0–8.0)

## 2021-01-17 LAB — CBC
HCT: 43.3 % (ref 36.0–46.0)
Hemoglobin: 14.8 g/dL (ref 12.0–15.0)
MCH: 31 pg (ref 26.0–34.0)
MCHC: 34.2 g/dL (ref 30.0–36.0)
MCV: 90.6 fL (ref 80.0–100.0)
Platelets: 195 10*3/uL (ref 150–400)
RBC: 4.78 MIL/uL (ref 3.87–5.11)
RDW: 13.3 % (ref 11.5–15.5)
WBC: 6.7 10*3/uL (ref 4.0–10.5)
nRBC: 0 % (ref 0.0–0.2)

## 2021-01-17 LAB — ECHOCARDIOGRAM COMPLETE
AR max vel: 2.17 cm2
AV Area VTI: 2.13 cm2
AV Area mean vel: 1.88 cm2
AV Mean grad: 5 mmHg
AV Peak grad: 7.4 mmHg
Ao pk vel: 1.36 m/s
Area-P 1/2: 8.34 cm2
Height: 67 in
S' Lateral: 2.55 cm
Weight: 3040 oz

## 2021-01-17 MED ORDER — TRAZODONE HCL 50 MG PO TABS
50.0000 mg | ORAL_TABLET | Freq: Every evening | ORAL | Status: DC | PRN
  Administered 2021-01-17 – 2021-01-18 (×2): 50 mg via ORAL
  Filled 2021-01-17 (×2): qty 1

## 2021-01-17 MED ORDER — DOCUSATE SODIUM 100 MG PO CAPS: 100.0000 mg | ORAL_CAPSULE | Freq: Every day | ORAL | Status: DC | PRN

## 2021-01-17 MED ORDER — CYANOCOBALAMIN 500 MCG PO TABS
500.0000 ug | ORAL_TABLET | Freq: Every day | ORAL | Status: DC
  Administered 2021-01-17 – 2021-01-19 (×3): 500 ug via ORAL
  Filled 2021-01-17 (×4): qty 1

## 2021-01-17 MED ORDER — ENOXAPARIN SODIUM 40 MG/0.4ML ~~LOC~~ SOLN
40.0000 mg | SUBCUTANEOUS | Status: DC
  Administered 2021-01-17 – 2021-01-18 (×2): 40 mg via SUBCUTANEOUS
  Filled 2021-01-17 (×2): qty 0.4

## 2021-01-17 MED ORDER — SERTRALINE HCL 50 MG PO TABS: 50.0000 mg | ORAL_TABLET | Freq: Two times a day (BID) | ORAL | Status: DC

## 2021-01-17 MED ORDER — ACETAMINOPHEN 325 MG PO TABS
650.0000 mg | ORAL_TABLET | ORAL | Status: DC | PRN
  Administered 2021-01-17 – 2021-01-18 (×3): 650 mg via ORAL
  Filled 2021-01-17 (×3): qty 2

## 2021-01-17 MED ORDER — ONDANSETRON HCL 4 MG/2ML IJ SOLN: 4.0000 mg | Freq: Four times a day (QID) | INTRAMUSCULAR | Status: AC | PRN

## 2021-01-17 MED ORDER — DIAZEPAM 2 MG PO TABS
2.0000 mg | ORAL_TABLET | Freq: Four times a day (QID) | ORAL | Status: DC | PRN
  Administered 2021-01-18: 2 mg via ORAL
  Filled 2021-01-17: qty 1

## 2021-01-17 MED ORDER — LABETALOL HCL 5 MG/ML IV SOLN: 5.0000 mg | INTRAVENOUS | Status: AC | PRN

## 2021-01-17 MED ORDER — SIMVASTATIN 20 MG PO TABS
40.0000 mg | ORAL_TABLET | Freq: Every evening | ORAL | Status: DC
  Administered 2021-01-17 – 2021-01-18 (×2): 40 mg via ORAL
  Filled 2021-01-17 (×2): qty 2

## 2021-01-17 MED ORDER — SERTRALINE HCL 50 MG PO TABS
50.0000 mg | ORAL_TABLET | Freq: Two times a day (BID) | ORAL | Status: DC
  Administered 2021-01-17 – 2021-01-19 (×5): 50 mg via ORAL
  Filled 2021-01-17 (×6): qty 1

## 2021-01-17 MED ORDER — LORAZEPAM 2 MG/ML IJ SOLN
2.0000 mg | Freq: Once | INTRAMUSCULAR | Status: AC
  Administered 2021-01-17: 16:00:00 2 mg via INTRAVENOUS
  Filled 2021-01-17 (×2): qty 1

## 2021-01-17 MED ORDER — STROKE: EARLY STAGES OF RECOVERY BOOK: Freq: Once | Status: AC

## 2021-01-17 MED ORDER — ACETAMINOPHEN 650 MG RE SUPP: 650.0000 mg | RECTAL | Status: DC | PRN

## 2021-01-17 MED ORDER — MECLIZINE HCL 25 MG PO TABS
25.0000 mg | ORAL_TABLET | Freq: Three times a day (TID) | ORAL | Status: DC | PRN
  Filled 2021-01-17: qty 1

## 2021-01-17 MED ORDER — HYDROCODONE-ACETAMINOPHEN 5-325 MG PO TABS: 1.0000 | ORAL_TABLET | ORAL | Status: DC | PRN

## 2021-01-17 MED ORDER — ACETAMINOPHEN 160 MG/5ML PO SOLN
650.0000 mg | ORAL | Status: DC | PRN
  Filled 2021-01-17: qty 20.3

## 2021-01-17 NOTE — Progress Notes (Signed)
SLP Cancellation Note  Patient Details Name: Lindsey Barnes MRN: [id number] DOB: May 04, 1933   Cancelled treatment:       Reason Eval/Treat Not Completed: SLP screened, no needs identified, will sign off (chart reviewed; consulted NSG then met w/ pt/family) Pt denied any difficulty swallowing and is currently on a regular diet; tolerates swallowing pills w/ water per NSG. Pt had eaten 1/2 of a boxed sandwich meal on table. General aspiration precautions briefly discussed including sitting fully Upright w/ all oral intake. Pt conversed at conversational level w/out deficits noted; pt and family present denied any speech-language deficits.  No further skilled ST services indicated as pt appears at her baseline. Pt agreed. NSG to reconsult if any change in status while admitted. Menu given and encouraged support ordering meals for pt while admitted.       Orinda Kenner, MS, CCC-SLP Speech Language Pathologist Rehab Services [phone] [facility] 01/17/2021, 2:14 PM

## 2021-01-17 NOTE — ED Triage Notes (Addendum)
Pt states that she had a fall while making coffee this morning- pt states her right arm went weak and her legs gave out- pt states someone had to help get her out of the floor- pt states she is having pain in her left arm and in her left leg- no obvious deformities noted- pt denies hitting head or LOC, however pt is complaining of a dull HA- pt denies taking any blood thinners

## 2021-01-17 NOTE — ED Notes (Signed)
Pt assisted to bedside commode with staff assistance x1 and use of cane. Steady gait observed. Patient educated on fall risk precautions and advised not to ambulate without assistance. Fall risk precautions in place and pt's family present at bedside.

## 2021-01-17 NOTE — ED Notes (Signed)
Continuous cardiac and pulse ox monitoring in use.

## 2021-01-17 NOTE — ED Notes (Signed)
Messaged RN bed assigned [id number]

## 2021-01-17 NOTE — Progress Notes (Signed)
OT Cancellation Note  Patient Details Name: Lindsey Barnes MRN: [id number] DOB: [birth year]   Cancelled Treatment:    Reason Eval/Treat Not Completed: Patient at procedure or test/ unavailable  OT consult received and chart reviewed. Pt being wheeled off floor to MRI at this time. Will f/u for OT evaluation later date/time as able. Thank you.  Gerrianne Scale, [place], OTR/L ascom [phone] 01/17/21, 4:45 PM

## 2021-01-17 NOTE — Progress Notes (Signed)
PT Cancellation Note  Patient Details Name: Lindsey Barnes MRN: [id number] DOB: 1933-03-14   Cancelled Treatment:    Reason Eval/Treat Not Completed: Patient at procedure or test/unavailable.  Pt being taken to MRI upon arrival to room.  Pt will be seen at later date/time as medically necessary.     Gwenlyn Saran, PT, DPT 01/17/21, 4:42 PM

## 2021-01-17 NOTE — H&P (Addendum)
History and Physical   Lindsey Barnes HGD:[id number] DOB: 04-14-1933 DOA: 01/17/2021  PCP: Juluis Pitch, MD  Outpatient Specialists: Dr. Fath, [facility] Patient coming from: Home  I have personally briefly reviewed patient's old medical records in [place].  Chief Concern: Right upper extremity weakness and numbness  HPI: Lindsey Barnes is a 87 y.o. female with medical history significant for hypertension, anxiety, depression, hyperlipidemia, history of cholecystectomy status post cholecystitis in 2019, history of right sided breast cancer s/p partial mastectomy, presents to the emergency department for chief concerns of right upper extremity weakness and numbness.  [name] reports that at approximately 8 AM to 8:30 AM she was making coffee and she experienced sudden onset of right upper extremity numbness and tingling and sudden acute bilateral lower extremity weakness causing her to fall on her bottom.  She denies hitting her head and loss of consciousness.  She states that she remembered feeling so weak and so scared.  She remembers saying to herself "Oh sh*t, Lindsey Barnes what are you going to do now?  "  She then states that she scooted on her bottom to the phone pulled herself up with her left arm to get to the phone to call her friend/neighbor, who is a Clinical biochemist.  Her friend who is a Clinical biochemist reports that when she and her husband arrived at [name] house, patient was still profoundly weak and neighbors husband had to pull patient up to a chair.  The approximate timeframe in which patient experienced the bilateral lower extremity weakness lasted approximately 30 to 40 minutes.  Patient reports that she ultimately did get to finish her coffee as her neighbor made the coffee for her.  Patient states that she has never felt this way before.  At bedside [name] denies fever, cough, sick contacts, chest pain, shortness of breath,  abdominal pain, dysuria, hematuria, diarrhea.  She states that at this time the acute weakness and right upper extremity numbness and tingling has resolved.  Patient states that she has taken her morning medications though she does not know what they are as her daughter arranges her pills for her.  Family history: Patient does endorse that her father suffered from multiple TIAs.  Social history: Patient lives by herself and is currently widowed.  She was formerly a housewife.  She had 4 children 2 of which have passed away due to heart problems.  She denies tobacco, EtOH, recreational drug use.  Vaccination: Patient has received 3 doses for COVID-19 vaccination  ROS: Constitutional: no weight change, no fever ENT/Mouth: no sore throat, no rhinorrhea Eyes: no eye pain, no vision changes Cardiovascular: no chest pain, no dyspnea,  no edema, no palpitations Respiratory: no cough, no sputum, no wheezing Gastrointestinal: no nausea, no vomiting, no diarrhea, no constipation Genitourinary: no urinary incontinence, no dysuria, no hematuria Musculoskeletal: no arthralgias, no myalgias Skin: no skin lesions, no pruritus, Neuro: + weakness, no loss of consciousness, no syncope Psych: no anxiety, no depression, + decrease appetite Heme/Lymph: no bruising, no bleeding  ED Course: Discussed with ED provider, patient requiring hospitalization due to stroke versus TIA.  Vitals in the emergency department was remarkable for temperature of 97.6, respiration rate of 21, heart rate of 71, blood pressure 156/83, patient SPO2 saturation at 100% on room air.  Labs in the emergency department was remarkable for sodium level of 140, potassium 3.6, chloride 106, bicarb 27, BUN 20, serum creatinine of 0.81, nonfasting blood glucose 106.  WBC  was 6.7, hemoglobin 14.8, platelets 195.  CT of the head without contrast in the emergency department revealed focal loss of gray-white matter differentiation and  hypoattenuation in the high right frontal lobe.  Moderate chronic microvascular ischemic disease.  Assessment/Plan  Principal Problem:   TIA (transient ischemic attack) Active Problems:   Compression fracture of T12 vertebra (HCC)   HTN (hypertension)   Hyperlipidemia   Low back pain   Depression, controlled   Insomnia, unspecified   # Stroke-like symptoms - CT head without contrast read as: Focal loss of gray-white matter differentiation hypoattenuation in the high right frontal lobe - Neurology has not been consulted pending MRI - Complete echo ordered - MRI of the brain ordered - Fasting lipid and A1c ordered in the AM - Permissive hypertension  - Labetalol 5 mg IV every 6 hours as needed for SBP greater than 175, 1 day ordered - Frequent neuro vascular checks - patient passed bedside nursing swallow study - PT, OT - Fall precaution and aspiration precaution  # Hypertension-currently controlled I suspect patient has taken her blood pressure medicine in the a.m. or ready - Holding home bisoprolol-hydrochlorothiazide 2.5-6.25 mg daily and propranolol 20 mg BID  # Depression-resumed home sertraline  # Anxiety-resumed sertraline, Valium 2 mg every 6 hours as needed for anxiety  # Claustrophobia and anxiety-Ativan 2 mg IV as needed for 15 minutes to 30 minutes prior to MRI  # Insomnia-resumed trazodone 50 mg nightly as needed for sleep  # H/o right sided breast cancer s/p partial mastectomy - no IV on the right side  Chart reviewed.   # History of cholecystectomy in 03/21/2019 2019  DVT prophylaxis: Enoxaparin 40 mg subcutaneous every 24 hours, SCDs Code Status: DNR Diet: Heart healthy diet Family Communication: Updated friend and neighbor at bedside Disposition Plan: Pending clinical course Consults called: None at this time, pending MRI Admission status: Observation, MedSurg, with telemetry, 48 hours  Past Medical History:  Diagnosis Date  . Anxiety   . Breast  cancer ([place])    1990  . Complication of anesthesia   . Depression   . Dyspnea    DOE  . GERD (gastroesophageal reflux disease)   . Headache(784.0)   . HOH (hard of hearing)   . Hypertension   . Lymphoma ([place])   . Pain    CHRONIC BACK  . Pain    CHRONIC BACK  . Pneumonia   . PONV (postoperative nausea and vomiting)   . Tremors of nervous system    HANDS OCCAS   Past Surgical History:  Procedure Laterality Date  . APPENDECTOMY    . BACK SURGERY    . BREAST SURGERY    . CATARACT EXTRACTION W/PHACO Left 01/26/2017   Procedure: CATARACT EXTRACTION PHACO AND INTRAOCULAR LENS PLACEMENT (IOC);  Surgeon: Birder Robson, MD;  Location: [facility] ORS;  Service: Ophthalmology;  Laterality: Left;  [place] 53.7 AP% 20.3 CDE 10.90 Fluid pack lot # [id number]  . CATARACT EXTRACTION W/PHACO Right 02/16/2017   Procedure: CATARACT EXTRACTION PHACO AND INTRAOCULAR LENS PLACEMENT (IOC);  Surgeon: Birder Robson, MD;  Location: [facility] ORS;  Service: Ophthalmology;  Laterality: Right;  [place] 00:55 AP% 22.5 CDE 12.43 Fluid pack lot # [id number]  . CESAREAN SECTION    . CHOLECYSTECTOMY N/A 03/20/2018   Procedure: LAPAROSCOPIC CHOLECYSTECTOMY;  Surgeon: Herbert Pun, MD;  Location: [facility] ORS;  Service: General;  Laterality: N/A;  . MASTECTOMY Right 1992  . TUBAL LIGATION     Social History:  reports that she  has never smoked. She has never used smokeless tobacco. She reports current drug use. Drugs: Hydrocodone and Benzodiazepines. She reports that she does not drink alcohol.  Allergies  Allergen Reactions  . Amoxicillin Swelling    Blisters.   . Aspirin Other (See Comments)    Stomach ulcers.  . Benadryl [Diphenhydramine Hcl] Other (See Comments)    Hyperactive.   No family history on file. Family history: Family history reviewed and not pertinent  Prior to Admission medications   Medication Sig Start Date End Date Taking? Authorizing Provider  alendronate (FOSAMAX) 70 MG tablet Take 70 mg by  mouth once a week. Take with a full glass of water on an empty stomach.    [provider]  ALPRAZolam Duanne Moron) 0.25 MG tablet Take 1 tablet (0.25 mg total) by mouth 3 (three) times daily as needed. For anxiety Patient not taking: Reported on 03/20/2018 01/19/12   Silvestre Moment, MD  bisoprolol-hydrochlorothiazide [facility]) 2.5-6.25 MG per tablet Take 1 tablet by mouth daily. 5-6.25 tab    [provider]  calcium carbonate (OSCAL) 1500 (600 Ca) MG TABS tablet Take 1,000 mg by mouth daily.    [provider]  Calcium Carbonate-Vitamin D3 (CALCIUM 600-D) 600-400 MG-UNIT TABS Take 1 tablet by mouth 1 day or 1 dose.    [provider]  cholecalciferol (VITAMIN D) 1000 units tablet Take 1,000 Units by mouth daily.    [provider]  diazepam (VALIUM) 2 MG tablet Take 2 mg by mouth every 6 (six) hours as needed for anxiety.    [provider]  HYDROcodone-acetaminophen (NORCO/VICODIN) 5-325 MG tablet Take 1-2 tablets by mouth every 4 (four) hours as needed for moderate pain. 03/22/18   Pabon, Diego F, MD  HYDROcodone-acetaminophen (NORCO/VICODIN) 5-325 MG tablet Take 1 tablet by mouth every 6 (six) hours as needed for moderate pain. 03/22/18   Pabon, Diego F, MD  meclizine (ANTIVERT) 25 MG tablet Take 25 mg by mouth 3 (three) times daily as needed. For dizziness    [provider]  ranitidine (ZANTAC) 150 MG tablet Take 150 mg by mouth 2 (two) times daily.    [provider]  scopolamine (TRANSDERM-SCOP) 1 MG/3DAYS Place 1 patch onto the skin every 3 (three) days. As needed    [provider]  sertraline (ZOLOFT) 100 MG tablet Take 50 mg by mouth 2 (two) times daily.     [provider]  sertraline (ZOLOFT) 100 MG tablet TAKE ONE-HALF TABLET BY MOUTH TWICE DAILY 03/30/18   [provider]  simvastatin (ZOCOR) 40 MG tablet Take 40 mg by mouth every evening.    [provider]  traZODone (DESYREL) 50 MG  tablet Take 50 mg by mouth at bedtime.    [provider]   Physical Exam: Vitals:   01/17/21 1046 01/17/21 1048 01/17/21 1214  BP: 134/81  (!) 156/83  Pulse: 76  71  Resp: 18  (!) 21  Temp: 97.6 F (36.4 C)    TempSrc: Oral    SpO2: 97%  100%  Weight:  86.2 kg   Height:  5\' 7"  (1.702 m)    Constitutional: appears age-appropriate, NAD, calm, comfortable Eyes: PERRL, lids and conjunctivae normal ENMT: Mucous membranes are moist. Posterior pharynx clear of any exudate or lesions. Age-appropriate dentition.  Mild hearing loss Neck: normal, supple, no masses, no thyromegaly Respiratory: clear to auscultation bilaterally, no wheezing, no crackles. Normal respiratory effort. No accessory muscle use.  Cardiovascular: Regular rate and rhythm,  no murmurs / rubs / gallops. No extremity edema. 2+ pedal pulses. No carotid bruits.  Abdomen: no tenderness, no masses palpated, no hepatosplenomegaly. Bowel sounds positive.  Musculoskeletal: no clubbing / cyanosis. No joint deformity upper and lower extremities. Good ROM, no contractures, no atrophy. Normal muscle tone.  Skin: no rashes, lesions, ulcers. No induration Neurologic: Sensation intact. Strength 5/5 in all 4.  Psychiatric: Normal judgment and insight. Alert and oriented x 3. Normal mood.   EKG: independently reviewed, showing sinus rhythm rate of 69, ventricular trigeminy, incomplete right bundle branch block, QTC 419  Chest x-ray on Admission: I personally reviewed and I agree with radiologist reading as below.  CT Head Wo Contrast  Result Date: 01/17/2021 CLINICAL DATA:  Headache, intracranial hemorrhage suspected. Reported right arm weakness. EXAM: CT HEAD WITHOUT CONTRAST TECHNIQUE: Contiguous axial images were obtained from the base of the skull through the vertex without intravenous contrast. COMPARISON:  None. FINDINGS: Brain: Focal loss of gray-white matter differentiation and hypoattenuation in the high right frontal lobe  (series 4, image 33; series 3, images 21/22 and series 5, images 19/20). No evidence of acute hemorrhage, hydrocephalus, extra-axial collection or mass lesion/mass effect. Moderate additional patchy white matter hypoattenuation, nonspecific but most likely related to chronic microvascular ischemic disease. Vascular: No hyperdense vessel identified. Calcific atherosclerosis. Skull: No acute fracture. Sinuses/Orbits: Visualized sinuses are clear. Unremarkable visualized orbits. Other: No mastoid effusions. IMPRESSION: 1. Focal loss of gray-white matter differentiation and hypoattenuation in the high right frontal lobe. While this could relate to chronic microvascular ischemic disease, acute or subacute infarct is not excluded in the absence of priors. Consider MRI to better evaluate. 2. Moderate chronic microvascular ischemic disease. Electronically Signed   By: Margaretha Sheffield MD   On: 01/17/2021 11:55   Labs on Admission: I have personally reviewed following labs  CBC: Recent Labs  Lab 01/17/21 1052  WBC 6.7  HGB 14.8  HCT 43.3  MCV 90.6  PLT 115   Basic Metabolic Panel: Recent Labs  Lab 01/17/21 1052  NA 140  K 3.6  CL 106  CO2 27  GLUCOSE 106*  BUN 20  CREATININE 0.81  CALCIUM 9.1   GFR: Estimated Creatinine Clearance: 57.2 mL/min (by C-G formula based on SCr of 0.81 mg/dL).  Urine analysis:    Component Value Date/Time   COLORURINE YELLOW (A) 01/17/2021 1051   APPEARANCEUR CLEAR (A) 01/17/2021 1051   APPEARANCEUR Hazy 02/01/2012 1102   LABSPEC 1.020 01/17/2021 1051   LABSPEC 1.006 02/01/2012 1102   PHURINE 7.0 01/17/2021 1051   GLUCOSEU NEGATIVE 01/17/2021 1051   GLUCOSEU Negative 02/01/2012 1102   HGBUR SMALL (A) 01/17/2021 1051   BILIRUBINUR NEGATIVE 01/17/2021 1051   BILIRUBINUR Negative 02/01/2012 1102   KETONESUR NEGATIVE 01/17/2021 1051   PROTEINUR NEGATIVE 01/17/2021 1051   NITRITE NEGATIVE 01/17/2021 1051   LEUKOCYTESUR TRACE (A) 01/17/2021 1051    LEUKOCYTESUR 1+ 02/01/2012 1102   Antara Brecheisen N Mallie Linnemann D.O. [facility]  If 7PM-7AM, please contact overnight-coverage provider If 7AM-7PM, please contact day coverage provider [email or web address]  01/17/2021, 12:55 PM

## 2021-01-17 NOTE — ED Provider Notes (Signed)
[facility] Emergency Department Provider Note   ____________________________________________   Event Date/Time   First MD Initiated Contact with Patient 01/17/21 1101     (approximate)  I have reviewed the triage vital signs and the nursing notes.   HISTORY  Chief Complaint Fall    HPI Lindsey Barnes is a 87 y.o. female with a past medical history of of, anxiety, hypertension, and chronic back pain who presents via private vehicle after she had acute right arm and leg weakness that occurred at approximately 0800 today.  Patient states that she was attempting to make coffee when she felt worsening tremor and then weakness in her right upper extremity as well as weakness in her legs that caused her to fall to the floor.  Patient states that she was unable to get up from the floor and "scooted" herself over to a phone to call a neighbor in order to help her up.  Patient states that this weakness did not last long and by the time her neighbors were able to come over and help her, she was able to walk on her own.  Patient denies any similar symptoms in the past but does endorse a significant family history of TIAs in her father.  Patient's family friend at bedside to denies patient having any facial droop or slurred speech upon their arrival.  Patient currently denies any vision changes, tinnitus, difficulty speaking, facial droop, sore throat, chest pain, shortness of breath, abdominal pain, nausea/vomiting/diarrhea, dysuria, or weakness/numbness/paresthesias in any extremity         Past Medical History:  Diagnosis Date  . Anxiety   . Breast cancer ([place])    1990  . Complication of anesthesia   . Depression   . Dyspnea    DOE  . GERD (gastroesophageal reflux disease)   . Headache(784.0)   . HOH (hard of hearing)   . Hypertension   . Lymphoma ([place])   . Pain    CHRONIC BACK  . Pain    CHRONIC BACK  . Pneumonia   . PONV (postoperative  nausea and vomiting)   . Tremors of nervous system    HANDS OCCAS    Patient Active Problem List   Diagnosis Date Noted  . Depression, controlled 04/01/2018  . Esophageal reflux 04/01/2018  . Senile osteoporosis 04/01/2018  . Acute cholecystitis 03/20/2018  . Insomnia, unspecified 12/11/2014  . Compression fracture of T12 vertebra ([place]) 01/13/2012  . HTN (hypertension) 01/13/2012  . Hyperlipidemia 01/13/2012  . Low back pain 01/13/2012    Past Surgical History:  Procedure Laterality Date  . APPENDECTOMY    . BACK SURGERY    . BREAST SURGERY    . CATARACT EXTRACTION W/PHACO Left 01/26/2017   Procedure: CATARACT EXTRACTION PHACO AND INTRAOCULAR LENS PLACEMENT (IOC);  Surgeon: Birder Robson, MD;  Location: [facility] ORS;  Service: Ophthalmology;  Laterality: Left;  [place] 53.7 AP% 20.3 CDE 10.90 Fluid pack lot # [id number]  . CATARACT EXTRACTION W/PHACO Right 02/16/2017   Procedure: CATARACT EXTRACTION PHACO AND INTRAOCULAR LENS PLACEMENT (IOC);  Surgeon: Birder Robson, MD;  Location: [facility] ORS;  Service: Ophthalmology;  Laterality: Right;  [place] 00:55 AP% 22.5 CDE 12.43 Fluid pack lot # [id number]  . CESAREAN SECTION    . CHOLECYSTECTOMY N/A 03/20/2018   Procedure: LAPAROSCOPIC CHOLECYSTECTOMY;  Surgeon: Herbert Pun, MD;  Location: [facility] ORS;  Service: General;  Laterality: N/A;  . MASTECTOMY Right 1992  . TUBAL LIGATION      Prior to  Admission medications   Medication Sig Start Date End Date Taking? Authorizing Provider  alendronate (FOSAMAX) 70 MG tablet Take 70 mg by mouth once a week. Take with a full glass of water on an empty stomach.    [provider]  ALPRAZolam Duanne Moron) 0.25 MG tablet Take 1 tablet (0.25 mg total) by mouth 3 (three) times daily as needed. For anxiety Patient not taking: Reported on 03/20/2018 01/19/12   Silvestre Moment, MD  bisoprolol-hydrochlorothiazide [facility]) 2.5-6.25 MG per tablet Take 1 tablet by mouth daily. 5-6.25 tab    [provider]  calcium carbonate (OSCAL) 1500 (600 Ca) MG TABS tablet Take 1,000 mg by mouth daily.    [provider]  Calcium Carbonate-Vitamin D3 (CALCIUM 600-D) 600-400 MG-UNIT TABS Take 1 tablet by mouth 1 day or 1 dose.    [provider]  cholecalciferol (VITAMIN D) 1000 units tablet Take 1,000 Units by mouth daily.    [provider]  diazepam (VALIUM) 2 MG tablet Take 2 mg by mouth every 6 (six) hours as needed for anxiety.    [provider]  HYDROcodone-acetaminophen (NORCO/VICODIN) 5-325 MG tablet Take 1-2 tablets by mouth every 4 (four) hours as needed for moderate pain. 03/22/18   Pabon, Diego F, MD  HYDROcodone-acetaminophen (NORCO/VICODIN) 5-325 MG tablet Take 1 tablet by mouth every 6 (six) hours as needed for moderate pain. 03/22/18   Pabon, Diego F, MD  meclizine (ANTIVERT) 25 MG tablet Take 25 mg by mouth 3 (three) times daily as needed. For dizziness    [provider]  ranitidine (ZANTAC) 150 MG tablet Take 150 mg by mouth 2 (two) times daily.    [provider]  scopolamine (TRANSDERM-SCOP) 1 MG/3DAYS Place 1 patch onto the skin every 3 (three) days. As needed    [provider]  sertraline (ZOLOFT) 100 MG tablet Take 50 mg by mouth 2 (two) times daily.     [provider]  sertraline (ZOLOFT) 100 MG tablet TAKE ONE-HALF TABLET BY MOUTH TWICE DAILY 03/30/18   [provider]  simvastatin (ZOCOR) 40 MG tablet Take 40 mg by mouth every evening.    [provider]  traZODone (DESYREL) 50 MG tablet Take 50 mg by mouth at bedtime.    [provider]    Allergies Amoxicillin, Aspirin, and Benadryl [diphenhydramine hcl]  No family history on file.  Social History Social History   Tobacco Use  . Smoking status: Never Smoker  . Smokeless tobacco: Never Used  Substance Use Topics  . Alcohol use: No  . Drug use: Yes    Types: Hydrocodone, Benzodiazepines    Review of  Systems Constitutional: No fever/chills Eyes: No visual changes. ENT: No sore throat. Cardiovascular: Denies chest pain. Respiratory: Denies shortness of breath. Gastrointestinal: No abdominal pain.  No nausea, no vomiting.  No diarrhea. Genitourinary: Negative for dysuria. Musculoskeletal: Negative for acute arthralgias Skin: Negative for rash. Neurological: Endorses transient weakness in her right upper extremity and legs.  Negative for headaches, numbness/paresthesias in any extremity Psychiatric: Negative for suicidal ideation/homicidal ideation   ____________________________________________   PHYSICAL EXAM:  VITAL SIGNS: ED Triage Vitals  Enc Vitals Group     BP 01/17/21 1046 134/81     Pulse Rate 01/17/21 1046 76     Resp 01/17/21 1046 18     Temp 01/17/21 1046 97.6 F (36.4 C)     Temp Source 01/17/21 1046 Oral     SpO2 01/17/21 1046 97 %  Weight 01/17/21 1048 190 lb (86.2 kg)     Height 01/17/21 1048 5\' 7"  (1.702 m)     Head Circumference --      Peak Flow --      Pain Score 01/17/21 1047 4     Pain Loc --      Pain Edu? --      Excl. in [place]? --    Constitutional: Alert and oriented. Well appearing and in no acute distress. Eyes: Conjunctivae are normal. PERRL. Head: Atraumatic. Nose: No congestion/rhinnorhea. Mouth/Throat: Mucous membranes are moist. Neck: No stridor Cardiovascular: Grossly normal heart sounds.  Good peripheral circulation. Respiratory: Normal respiratory effort.  No retractions. Gastrointestinal: Soft and nontender. No distention. Musculoskeletal: No obvious deformities Neurologic:  Normal speech and language. No gross focal neurologic deficits are appreciated.  No drift in upper and lower extremities.  NIH stroke score is 0 Skin:  Skin is warm and dry. No rash noted. Psychiatric: Mood and affect are normal. Speech and behavior are normal.  ____________________________________________   LABS (all labs ordered are listed, but only  abnormal results are displayed)  Labs Reviewed  BASIC METABOLIC PANEL - Abnormal; Notable for the following components:      Result Value   Glucose, Bld 106 (*)    All other components within normal limits  RESP PANEL BY RT-PCR (FLU A&B, COVID) ARPGX2  CBC  URINALYSIS, COMPLETE (UACMP) WITH MICROSCOPIC   ____________________________________________  EKG  ED ECG REPORT I, Naaman Plummer, the attending physician, personally viewed and interpreted this ECG.  Date: 01/17/2021 EKG Time: 1104 Rate: 69 Rhythm: normal sinus rhythm QRS Axis: normal Intervals: normal ST/T Wave abnormalities: normal Narrative Interpretation: no evidence of acute ischemia  ____________________________________________  RADIOLOGY  ED MD interpretation: CT without contrast of the brain shows focal loss of gray-white matter differentiation and hypoattenuation in the right frontal lobe that could relate to chronic microvascular disease or acute versus subacute infarct  Official radiology report(s): CT Head Wo Contrast  Result Date: 01/17/2021 CLINICAL DATA:  Headache, intracranial hemorrhage suspected. Reported right arm weakness. EXAM: CT HEAD WITHOUT CONTRAST TECHNIQUE: Contiguous axial images were obtained from the base of the skull through the vertex without intravenous contrast. COMPARISON:  None. FINDINGS: Brain: Focal loss of gray-white matter differentiation and hypoattenuation in the high right frontal lobe (series 4, image 33; series 3, images 21/22 and series 5, images 19/20). No evidence of acute hemorrhage, hydrocephalus, extra-axial collection or mass lesion/mass effect. Moderate additional patchy white matter hypoattenuation, nonspecific but most likely related to chronic microvascular ischemic disease. Vascular: No hyperdense vessel identified. Calcific atherosclerosis. Skull: No acute fracture. Sinuses/Orbits: Visualized sinuses are clear. Unremarkable visualized orbits. Other: No mastoid  effusions. IMPRESSION: 1. Focal loss of gray-white matter differentiation and hypoattenuation in the high right frontal lobe. While this could relate to chronic microvascular ischemic disease, acute or subacute infarct is not excluded in the absence of priors. Consider MRI to better evaluate. 2. Moderate chronic microvascular ischemic disease. Electronically Signed   By: Margaretha Sheffield MD   On: 01/17/2021 11:55    ____________________________________________   PROCEDURES  Procedure(s) performed (including Critical Care):  .1-3 Lead EKG Interpretation Performed by: Naaman Plummer, MD Authorized by: Naaman Plummer, MD     Interpretation: normal     ECG rate:  69   ECG rate assessment: normal     Rhythm: sinus rhythm     Ectopy: none     Conduction: normal  ____________________________________________   INITIAL IMPRESSION / ASSESSMENT AND PLAN / ED COURSE  As part of my medical decision making, I reviewed the following data within the [facility] notes reviewed and incorporated, Labs reviewed, EKG interpreted, Old chart reviewed, Radiograph reviewed and Notes from prior ED visits reviewed and incorporated        Patient is a 87 year old female who presents with symptoms concerning for TIA PMH risk factors: Hypertension, family history of TIA Neurologic Deficits: Right upper and lower extremity weakness Last known Well Time: 0800 NIH Stroke Score: 0 Given History and Exam I have lower suspicion for infectious etiology, neurologic changes secondary to toxicologic ingestion, seizure, complex migraine. Presentation concerning for possible stroke requiring workup.  Workup: Labs: POC glucose, CBC, BMP, LFTs, Troponin, PT/INR, PTT, Type and Screen Other Diagnostics: ECG, CXR, non-contrast head CT followed by CTA brain and neck (to r/o large vessel occlusion amenable to thrombectomy) Interventions: Patient's not eligible for TPA due to resolution of  symptoms prior to evaluation  Consult: hospitalist Disposition: Admit      ____________________________________________   FINAL CLINICAL IMPRESSION(S) / ED DIAGNOSES  Final diagnoses:  Fall, initial encounter  Weakness of right upper extremity  Transient weakness of lower extremity     ED Discharge Orders    None       Note:  This document was prepared using Dragon voice recognition software and may include unintentional dictation errors.   Naaman Plummer, MD 01/17/21 [phone]

## 2021-01-17 NOTE — Progress Notes (Signed)
*  PRELIMINARY RESULTS* Echocardiogram 2D Echocardiogram has been performed.  Sherrie Sport 01/17/2021, 2:56 PM

## 2021-01-18 ENCOUNTER — Observation Stay: Payer: PPO

## 2021-01-18 DIAGNOSIS — I10 Essential (primary) hypertension: Secondary | ICD-10-CM

## 2021-01-18 DIAGNOSIS — I63522 Cerebral infarction due to unspecified occlusion or stenosis of left anterior cerebral artery: Secondary | ICD-10-CM

## 2021-01-18 DIAGNOSIS — I6523 Occlusion and stenosis of bilateral carotid arteries: Secondary | ICD-10-CM | POA: Diagnosis not present

## 2021-01-18 DIAGNOSIS — I639 Cerebral infarction, unspecified: Secondary | ICD-10-CM | POA: Diagnosis not present

## 2021-01-18 DIAGNOSIS — G9389 Other specified disorders of brain: Secondary | ICD-10-CM | POA: Diagnosis not present

## 2021-01-18 LAB — HEMOGLOBIN A1C
Hgb A1c MFr Bld: 5.5 % (ref 4.8–5.6)
Mean Plasma Glucose: 111.15 mg/dL

## 2021-01-18 LAB — LIPID PANEL
Cholesterol: 159 mg/dL (ref 0–200)
HDL: 41 mg/dL (ref >40)
LDL Cholesterol: 91 mg/dL (ref 0–99)
Total CHOL/HDL Ratio: 3.9 RATIO
Triglycerides: 136 mg/dL (ref <150)
VLDL: 27 mg/dL (ref 0–40)

## 2021-01-18 MED ORDER — CLOPIDOGREL BISULFATE 75 MG PO TABS: 75.0000 mg | ORAL_TABLET | Freq: Every day | ORAL | 0 refills | Status: AC

## 2021-01-18 MED ORDER — GADOBUTROL 1 MMOL/ML IV SOLN: 9.0000 mL | Freq: Once | INTRAVENOUS | Status: DC | PRN

## 2021-01-18 MED ORDER — GADOBUTROL 1 MMOL/ML IV SOLN
7.5000 mL | Freq: Once | INTRAVENOUS | Status: AC | PRN
  Administered 2021-01-18: 12:00:00 7.5 mL via INTRAVENOUS

## 2021-01-18 MED ORDER — CLOPIDOGREL BISULFATE 75 MG PO TABS
75.0000 mg | ORAL_TABLET | Freq: Every day | ORAL | Status: DC
  Administered 2021-01-18 – 2021-01-19 (×2): 75 mg via ORAL
  Filled 2021-01-18 (×2): qty 1

## 2021-01-18 NOTE — Consult Note (Addendum)
Referring Physician: Dr. Roosevelt Locks    Chief Complaint: Punctate acute left frontal lobe stroke  HPI: Lindsey Barnes is an 87 y.o. female with a PMHx of anxiety, breast cancer, dyspnea on exertion, headache, HTN, lymphoma and intermittent hand tremor who presented to the hospital yesterday morning after a fall. Her right arm became weak and her legs gave out while she was making coffee at home. The fall was preceded by worsened hand tremor. She was unable to get up and had to scoot along the floor to get to the telephone to call a neighbor for help. While waiting for her neighbor to arrive, the patient's strength returned and she was able to get up and walk on her own. The patient did not hit her head or lose consciousness. She was brought to the ED by private vehicle. The fall occurred at about 0800 on Friday. MRI brain was obtained to further evaluate the etiology for the patient's transient weakness, revealing a punctate acute cortically based ischemic infarction in the left frontal lobe.   MRI brain: 1. Punctate cortical infarct in the high left frontal lobe. No edema or mass effect. 2. Approximately 2 cm extra-axial dural-based abnormality along the high right frontal convexity most likely represents a meningioma, but the degree of diffusion restriction is atypical. Recommend post-contrast MRI to confirm and exclude other etiologies. 3. Additional 8 mm extra-axial lesion along right anterior falx is more characteristic of a meningioma, but can also be evaluated on the recommended post-contrast imaging. 4. Moderate chronic microvascular ischemic disease with suspected small focal remote right frontal cortical infarct.  Carotid Ultrasound: Completed with report pending  TTE: 1. Left ventricular ejection fraction, by estimation, is 55 to 60%. The  left ventricle has normal function. The left ventricle has no regional  wall motion abnormalities. There is mild left ventricular hypertrophy.   Left ventricular diastolic parameters  are consistent with Grade I diastolic dysfunction (impaired relaxation).  2. Right ventricular systolic function is normal. The right ventricular  size is mildly enlarged.  3. Left atrial size was mildly dilated.  4. Right atrial size was mildly dilated.  5. The mitral valve was not well visualized. Trivial mitral valve  regurgitation.  6. The aortic valve is grossly normal. Aortic valve regurgitation is not  visualized.   EKG: Sinus rhythm Ventricular trigeminy Left bundle branch block   Past Medical History:  Diagnosis Date  . Anxiety   . Breast cancer ([place])    1990  . Complication of anesthesia   . Depression   . Dyspnea    DOE  . GERD (gastroesophageal reflux disease)   . Headache(784.0)   . HOH (hard of hearing)   . Hypertension   . Lymphoma ([place])   . Pain    CHRONIC BACK  . Pain    CHRONIC BACK  . Pneumonia   . PONV (postoperative nausea and vomiting)   . Tremors of nervous system    HANDS OCCAS    Past Surgical History:  Procedure Laterality Date  . APPENDECTOMY    . BACK SURGERY    . BREAST SURGERY    . CATARACT EXTRACTION W/PHACO Left 01/26/2017   Procedure: CATARACT EXTRACTION PHACO AND INTRAOCULAR LENS PLACEMENT (IOC);  Surgeon: Birder Robson, MD;  Location: [facility] ORS;  Service: Ophthalmology;  Laterality: Left;  [place] 53.7 AP% 20.3 CDE 10.90 Fluid pack lot # [id number]  . CATARACT EXTRACTION W/PHACO Right 02/16/2017   Procedure: CATARACT EXTRACTION PHACO AND INTRAOCULAR LENS PLACEMENT (IOC);  Surgeon:  Birder Robson, MD;  Location: [facility] ORS;  Service: Ophthalmology;  Laterality: Right;  [place] 00:55 AP% 22.5 CDE 12.43 Fluid pack lot # [id number]  . CESAREAN SECTION    . CHOLECYSTECTOMY N/A 03/20/2018   Procedure: LAPAROSCOPIC CHOLECYSTECTOMY;  Surgeon: Herbert Pun, MD;  Location: [facility] ORS;  Service: General;  Laterality: N/A;  . MASTECTOMY Right 1992  . TUBAL LIGATION      No family history on  file. Social History:  reports that she has never smoked. She has never used smokeless tobacco. She reports current drug use. Drugs: Hydrocodone and Benzodiazepines. She reports that she does not drink alcohol.  Allergies:  Allergies  Allergen Reactions  . Amoxicillin Swelling    Blisters.   . Aspirin Other (See Comments)    Stomach ulcers.  . Benadryl [Diphenhydramine Hcl] Other (See Comments)    Hyperactive.    Medications:  Medications Prior to Admission  Medication Sig Dispense Refill Last Dose  . bisoprolol-hydrochlorothiazide (ZIAC) 2.5-6.25 MG per tablet Take 1 tablet by mouth daily. 5-6.25 tab   01/17/2021 at Unknown time  . Calcium Carbonate-Vitamin D3 600-400 MG-UNIT TABS Take 1 tablet by mouth 1 day or 1 dose.   01/17/2021 at AM  . cholecalciferol (VITAMIN D) 1000 units tablet Take 1,000 Units by mouth daily.   01/16/2021 at Unknown time  . docusate sodium (COLACE) 100 MG capsule Take 100 mg by mouth daily as needed for mild constipation or moderate constipation.   PRN  . KLOR-CON M20 20 MEQ tablet Take 20 mEq by mouth in the morning and at bedtime.   01/16/2021 at Unknown time  . meclizine (ANTIVERT) 25 MG tablet Take 25 mg by mouth 3 (three) times daily as needed. For dizziness   Past Week at Unknown time  . propranolol (INDERAL) 20 MG tablet Take 20 mg by mouth 2 (two) times daily.   01/16/2021 at Unknown time  . ranitidine (ZANTAC) 150 MG tablet Take 150 mg by mouth 2 (two) times daily.   01/17/2021 at AM  . sertraline (ZOLOFT) 100 MG tablet Take 50 mg by mouth 2 (two) times daily.    01/16/2021 at Unknown time  . simvastatin (ZOCOR) 40 MG tablet Take 40 mg by mouth every evening.   01/17/2021 at Unknown time  . vitamin B-12 (CYANOCOBALAMIN) 500 MCG tablet Take 500 mcg by mouth daily.   01/17/2021 at AM  . ALPRAZolam (XANAX) 0.25 MG tablet Take 1 tablet (0.25 mg total) by mouth 3 (three) times daily as needed. For anxiety (Patient not taking: No sig reported) 20 tablet 0 Not Taking at  Unknown time  . diazepam (VALIUM) 2 MG tablet Take 2 mg by mouth every 6 (six) hours as needed for anxiety.   PRN  . HYDROcodone-acetaminophen (NORCO/VICODIN) 5-325 MG tablet Take 1-2 tablets by mouth every 4 (four) hours as needed for moderate pain. 30 tablet 0   . HYDROcodone-acetaminophen (NORCO/VICODIN) 5-325 MG tablet Take 1 tablet by mouth every 6 (six) hours as needed for moderate pain. 20 tablet 0 PRN  . scopolamine (TRANSDERM-SCOP) 1 MG/3DAYS Place 1 patch onto the skin every 3 (three) days. As needed     . sertraline (ZOLOFT) 100 MG tablet TAKE ONE-HALF TABLET BY MOUTH TWICE DAILY     . traZODone (DESYREL) 50 MG tablet Take 50 mg by mouth at bedtime.   PRN   Scheduled: . clopidogrel  75 mg Oral Daily  . enoxaparin (LOVENOX) injection  40 mg Subcutaneous Q24H  . sertraline  50  mg Oral BID  . simvastatin  40 mg Oral QPM  . vitamin B-12  500 mcg Oral Daily     ROS: As per HPI. The patient has no additional complaints.    Physical Examination: Blood pressure 118/78, pulse 84, temperature 97.8 F (36.6 C), resp. rate 16, height 5\' 7"  (1.702 m), weight 86.2 kg, SpO2 98 %.  HEENT: [place]/AT Lungs: Respirations unlabored Ext: No edema  Neurologic Examination: Mental Status:  Alert, fully oriented, thought content appropriate.  Speech fluent without evidence of aphasia.  Able to follow all commands without difficulty. Cranial Nerves: II:  Temporal visual fields intact with no extinction to DSS. PERRL III,IV, VI: No ptosis. EOMI. No nystagmus. V,VII: Facial temp sensation equal bilaterally. Smile symmetric.  VIII: Hearing intact to conversation.  IX,X: No hypophonia XI: Symmetric XII: Midline tongue extension Motor: Right : Upper extremity   5/5    Left:     Upper extremity   5/5  Lower extremity   5/5     Lower extremity   5/5 No pronator drift No asterixis.  Sensory: Temp and light touch intact throughout, bilaterally. No extinction to DSS.  Deep Tendon Reflexes:  2+ in  upper and lower extremities bilaterally.  Cerebellar: No ataxia with FNF bilaterally. Bilateral intention tremor is noted. No rest tremor seen.  Gait: Deferred  Results for orders placed or performed during the hospital encounter of 01/17/21 (from the past 48 hour(s))  Urinalysis, Complete w Microscopic     Status: Abnormal   Collection Time: 01/17/21 10:51 AM  Result Value Ref Range   Color, Urine YELLOW (A) YELLOW   APPearance CLEAR (A) CLEAR   Specific Gravity, Urine 1.020 1.005 - 1.030   pH 7.0 5.0 - 8.0   Glucose, UA NEGATIVE NEGATIVE mg/dL   Hgb urine dipstick SMALL (A) NEGATIVE   Bilirubin Urine NEGATIVE NEGATIVE   Ketones, ur NEGATIVE NEGATIVE mg/dL   Protein, ur NEGATIVE NEGATIVE mg/dL   Nitrite NEGATIVE NEGATIVE   Leukocytes,Ua TRACE (A) NEGATIVE   RBC / HPF 0-5 0 - 5 RBC/hpf   WBC, UA 0-5 0 - 5 WBC/hpf   Bacteria, UA NONE SEEN NONE SEEN   Squamous Epithelial / LPF 6-10 0 - 5   Mucus PRESENT     Comment: Performed at [facility], [address] [place], [place] [address]  Basic metabolic panel     Status: Abnormal   Collection Time: 01/17/21 10:52 AM  Result Value Ref Range   Sodium 140 135 - 145 mmol/L   Potassium 3.6 3.5 - 5.1 mmol/L   Chloride 106 98 - 111 mmol/L   CO2 27 22 - 32 mmol/L   Glucose, Bld 106 (H) 70 - 99 mg/dL    Comment: Glucose reference range applies only to samples taken after fasting for at least 8 hours.   BUN 20 8 - 23 mg/dL   Creatinine, Ser 0.81 0.44 - 1.00 mg/dL   Calcium 9.1 8.9 - 10.3 mg/dL   GFR, Estimated >60 >60 mL/min    Comment: (NOTE) Calculated using the CKD-EPI Creatinine Equation (2021)    Anion gap 7 5 - 15    Comment: Performed at [facility], [address] [place], [place] [address]  CBC     Status: None   Collection Time: 01/17/21 10:52 AM  Result Value Ref Range   WBC 6.7 4.0 - 10.5 K/uL   RBC 4.78 3.87 - 5.11 MIL/uL   Hemoglobin 14.8 12.0 - 15.0 g/dL  HCT 43.3 36.0 - 46.0 %   MCV 90.6 80.0  - 100.0 fL   MCH 31.0 26.0 - 34.0 pg   MCHC 34.2 30.0 - 36.0 g/dL   RDW 13.3 11.5 - 15.5 %   Platelets 195 150 - 400 K/uL   nRBC 0.0 0.0 - 0.2 %    Comment: Performed at [facility], [address], [place], [place] [address]  Resp Panel by RT-PCR (Flu A&B, Covid) Nasopharyngeal Swab     Status: None   Collection Time: 01/17/21 12:33 PM   Specimen: Nasopharyngeal Swab; Nasopharyngeal(NP) swabs in vial transport medium  Result Value Ref Range   SARS Coronavirus 2 by RT PCR NEGATIVE NEGATIVE    Comment: (NOTE) SARS-CoV-2 target nucleic acids are NOT DETECTED.  The SARS-CoV-2 RNA is generally detectable in upper respiratory specimens during the acute phase of infection. The lowest concentration of SARS-CoV-2 viral copies this assay can detect is 138 copies/mL. A negative result does not preclude SARS-Cov-2 infection and should not be used as the sole basis for treatment or other patient management decisions. A negative result may occur with  improper specimen collection/handling, submission of specimen other than nasopharyngeal swab, presence of viral mutation(s) within the areas targeted by this assay, and inadequate number of viral copies(<138 copies/mL). A negative result must be combined with clinical observations, patient history, and epidemiological information. The expected result is Negative.  Fact Sheet for Patients:  EntrepreneurPulse.com.au  Fact Sheet for Healthcare Providers:  IncredibleEmployment.be  This test is no t yet approved or cleared by the [place] FDA and  has been authorized for detection and/or diagnosis of SARS-CoV-2 by FDA under an Emergency Use Authorization (EUA). This EUA will remain  in effect (meaning this test can be used) for the duration of the COVID-19 declaration under Section 564(b)(1) of the Act, 21 U.S.C.section 360bbb-3(b)(1), unless the authorization is terminated  or revoked sooner.        Influenza A by PCR NEGATIVE NEGATIVE   Influenza B by PCR NEGATIVE NEGATIVE    Comment: (NOTE) The Xpert Xpress SARS-CoV-2/FLU/RSV plus assay is intended as an aid in the diagnosis of influenza from Nasopharyngeal swab specimens and should not be used as a sole basis for treatment. Nasal washings and aspirates are unacceptable for Xpert Xpress SARS-CoV-2/FLU/RSV testing.  Fact Sheet for Patients: EntrepreneurPulse.com.au  Fact Sheet for Healthcare Providers: IncredibleEmployment.be  This test is not yet approved or cleared by the [place] FDA and has been authorized for detection and/or diagnosis of SARS-CoV-2 by FDA under an Emergency Use Authorization (EUA). This EUA will remain in effect (meaning this test can be used) for the duration of the COVID-19 declaration under Section 564(b)(1) of the Act, 21 U.S.C. section 360bbb-3(b)(1), unless the authorization is terminated or revoked.  Performed at [facility], [address] [place],  [address]   Lipid panel     Status: None   Collection Time: 01/18/21  5:17 AM  Result Value Ref Range   Cholesterol 159 0 - 200 mg/dL   Triglycerides 136 <150 mg/dL   HDL 41 >40 mg/dL   Total CHOL/HDL Ratio 3.9 RATIO   VLDL 27 0 - 40 mg/dL   LDL Cholesterol 91 0 - 99 mg/dL    Comment:        Total Cholesterol/HDL:CHD Risk Coronary Heart Disease Risk Table                     Men   Women  1/2 Average Risk   3.4   3.3  Average Risk       5.0   4.4  2 X Average Risk   9.6   7.1  3 X Average Risk  23.4   11.0        Use the calculated Patient Ratio above and the CHD Risk Table to determine the patient's CHD Risk.        ATP III CLASSIFICATION (LDL):  <100     mg/dL   Optimal  100-129  mg/dL   Near or Above                    Optimal  130-159  mg/dL   Borderline  160-189  mg/dL   High  >190     mg/dL   Very High Performed at [facility], [address] [place], [place] [address]    DG Lumbar Spine 2-3 Views  Result Date: 01/17/2021 CLINICAL DATA:  Fall today. EXAM: LUMBAR SPINE - 2-3 VIEW COMPARISON:  01/13/2012 FINDINGS: Chronic compression fracture with cement vertebral augmentation at L2. No interval change. Severe compression fracture of T12. Mild fracture of this level is seen on the CT thoracic spine 01/13/2012. This has progressed significantly in the interval. There are anterior osteophytes suggesting this may be chronic. Normal lumbar alignment. Moderate to advanced disc degeneration L4-5. IMPRESSION: Chronic compression fracture and vertebral augmentation at L2-3 Severe compression fracture T12 of indeterminate age but favor chronic. Electronically Signed   By: Franchot Gallo M.D.   On: 01/17/2021 14:24   DG Pelvis 1-2 Views  Result Date: 01/17/2021 CLINICAL DATA:  Fall today. EXAM: PELVIS - 1-2 VIEW COMPARISON:  None. FINDINGS: There is no evidence of pelvic fracture or diastasis. No pelvic bone lesions are seen. Lumbar disc degeneration at L4-5. IMPRESSION: Negative. Electronically Signed   By: Franchot Gallo M.D.   On: 01/17/2021 14:22   CT Head Wo Contrast  Result Date: 01/17/2021 CLINICAL DATA:  Headache, intracranial hemorrhage suspected. Reported right arm weakness. EXAM: CT HEAD WITHOUT CONTRAST TECHNIQUE: Contiguous axial images were obtained from the base of the skull through the vertex without intravenous contrast. COMPARISON:  None. FINDINGS: Brain: Focal loss of gray-white matter differentiation and hypoattenuation in the high right frontal lobe (series 4, image 33; series 3, images 21/22 and series 5, images 19/20). No evidence of acute hemorrhage, hydrocephalus, extra-axial collection or mass lesion/mass effect. Moderate additional patchy white matter hypoattenuation, nonspecific but most likely related to chronic microvascular ischemic disease. Vascular: No hyperdense vessel identified. Calcific atherosclerosis. Skull: No acute  fracture. Sinuses/Orbits: Visualized sinuses are clear. Unremarkable visualized orbits. Other: No mastoid effusions. IMPRESSION: 1. Focal loss of gray-white matter differentiation and hypoattenuation in the high right frontal lobe. While this could relate to chronic microvascular ischemic disease, acute or subacute infarct is not excluded in the absence of priors. Consider MRI to better evaluate. 2. Moderate chronic microvascular ischemic disease. Electronically Signed   By: Margaretha Sheffield MD   On: 01/17/2021 11:55   MR BRAIN WO CONTRAST  Result Date: 01/17/2021 CLINICAL DATA:  Stroke follow-up. EXAM: MRI HEAD WITHOUT CONTRAST TECHNIQUE: Multiplanar, multiecho pulse sequences of the brain and surrounding structures were obtained without intravenous contrast. COMPARISON:  Same day head CT. FINDINGS: Brain: Punctate cortical infarct in the high left frontal lobe (series 5, image 47). Moderate patchy T2/FLAIR hyperintensities within the white matter and pons, nonspecific but most likely related to chronic microvascular ischemic disease. Focal T2/FLAIR hyperintensity  in the right frontal cortex (series 10, image 20) likely represents a prior infarct. Numerous dilated perivascular spaces in bilateral basal ganglia. No hydrocephalus. No midline shift. No extra-axial fluid collections. Approximately 8 mm extra-axial, dural-based mass along the anterior aspect of the right falx (series 10, image 18) which demonstrated mineralization on same day head CT and most likely represents a meningioma. Additional 2 cm extra-axial, dural-based abnormality along the high right frontal convexity (series 15, image 45 and series 16, image 137) avidly restricts diffusion (series 5, image 46). No substantial mass effect or adjacent brain edema with either lesion Vascular: Major arterial flow voids are maintained at the skull base. Skull and upper cervical spine: Normal marrow signal. Sinuses/Orbits: Mild ethmoid air cell mucosal  thickening. Otherwise, clear sinuses. Other: No mastoid effusions. IMPRESSION: 1. Punctate cortical infarct in the high left frontal lobe. No edema or mass effect. 2. Approximately 2 cm extra-axial dural-based abnormality along the high right frontal convexity most likely represents a meningioma, but the degree of diffusion restriction is atypical. Recommend post-contrast MRI to confirm and exclude other etiologies. 3. Additional 8 mm extra-axial lesion along right anterior falx is more characteristic of a meningioma, but can also be evaluated on the recommended post-contrast imaging. 4. Moderate chronic microvascular ischemic disease with suspected small focal remote right frontal cortical infarct. Electronically Signed   By: Margaretha Sheffield MD   On: 01/17/2021 17:46   ECHOCARDIOGRAM COMPLETE  Result Date: 01/17/2021    ECHOCARDIOGRAM REPORT   Patient Name:   Lindsey Barnes [facility] Date of Exam: 01/17/2021 Medical Rec #:  [id number]             Height:       67.0 in Accession #:    [id number]            Weight:       190.0 lb Date of Birth:  05-16-1933            BSA:          1.979 m Patient Age:    47 years              BP:           156/83 mmHg Patient Gender: F                     HR:           1 bpm. Exam Location:  [facility] Procedure: Cardiac Doppler, Color Doppler and 2D Echo Indications:     Stroke I63.9  History:         Patient has no prior history of Echocardiogram examinations.                  Signs/Symptoms:Dyspnea; Risk Factors:Hypertension.  Sonographer:     Sherrie Sport RDCS (AE) Referring Phys:  [id number] AMY N COX Diagnosing Phys: Bartholome Bill MD IMPRESSIONS  1. Left ventricular ejection fraction, by estimation, is 55 to 60%. The left ventricle has normal function. The left ventricle has no regional wall motion abnormalities. There is mild left ventricular hypertrophy. Left ventricular diastolic parameters are consistent with Grade I diastolic dysfunction (impaired relaxation).  2. Right ventricular  systolic function is normal. The right ventricular size is mildly enlarged.  3. Left atrial size was mildly dilated.  4. Right atrial size was mildly dilated.  5. The mitral valve was not well visualized. Trivial mitral valve regurgitation.  6. The aortic valve is grossly normal. Aortic valve regurgitation is  not visualized. FINDINGS  Left Ventricle: Left ventricular ejection fraction, by estimation, is 55 to 60%. The left ventricle has normal function. The left ventricle has no regional wall motion abnormalities. The left ventricular internal cavity size was normal in size. There is  mild left ventricular hypertrophy. Left ventricular diastolic parameters are consistent with Grade I diastolic dysfunction (impaired relaxation). Right Ventricle: The right ventricular size is mildly enlarged. No increase in right ventricular wall thickness. Right ventricular systolic function is normal. Left Atrium: Left atrial size was mildly dilated. Right Atrium: Right atrial size was mildly dilated. Pericardium: There is no evidence of pericardial effusion. Mitral Valve: The mitral valve was not well visualized. Trivial mitral valve regurgitation. Tricuspid Valve: The tricuspid valve is not well visualized. Tricuspid valve regurgitation is trivial. Aortic Valve: The aortic valve is grossly normal. Aortic valve regurgitation is not visualized. Aortic valve mean gradient measures 5.0 mmHg. Aortic valve peak gradient measures 7.4 mmHg. Aortic valve area, by VTI measures 2.13 cm. Pulmonic Valve: The pulmonic valve was not well visualized. Pulmonic valve regurgitation is trivial. Aorta: The aortic root is normal in size and structure. IAS/Shunts: The interatrial septum was not assessed.  LEFT VENTRICLE PLAX 2D LVIDd:         3.61 cm LVIDs:         2.55 cm LV PW:         1.45 cm LV IVS:        1.22 cm LVOT diam:     2.00 cm LV SV:         54 LV SV Index:   27 LVOT Area:     3.14 cm  LEFT ATRIUM           Index       RIGHT ATRIUM            Index LA diam:      5.00 cm 2.53 cm/m  RA Area:     18.10 cm LA Vol (A2C): 85.1 ml 43.01 ml/m RA Volume:   47.90 ml  24.21 ml/m LA Vol (A4C): 41.9 ml 21.18 ml/m  AORTIC VALVE AV Area (Vmax):    2.17 cm AV Area (Vmean):   1.88 cm AV Area (VTI):     2.13 cm AV Vmax:           136.00 cm/s AV Vmean:          103.000 cm/s AV VTI:            0.255 m AV Peak Grad:      7.4 mmHg AV Mean Grad:      5.0 mmHg LVOT Vmax:         93.80 cm/s LVOT Vmean:        61.700 cm/s LVOT VTI:          0.173 m LVOT/AV VTI ratio: 0.68 MITRAL VALVE MV Area (PHT): 8.34 cm    SHUNTS MV Decel Time: 91 msec     Systemic VTI:  0.17 m MV E velocity: 56.60 cm/s  Systemic Diam: 2.00 cm MV A velocity: 92.10 cm/s MV E/A ratio:  0.61 Bartholome Bill MD Electronically signed by Bartholome Bill MD Signature Date/Time: 01/17/2021/3:58:45 PM    Final     Assessment: 87 y.o. female presenting after a fall at home precipitated by acute onset of right arm and BLE weakness.  1. Exam reveals no focal weakness, facial droop, aphasia or sensory deficit. Bilateral intention tremor is noted.  2. TTE and EKG without  findings to suggest a cardiac source for emboli. However, the cortical location of the stroke is suspicious for a cardioembolic, as well as atheroembolic etiology. May need a Holter monitor or loop recorder after discharge.  3. The patient has a listed allergy to ASA 4. Stroke Risk Factors - history of cancer and HTN  5. MRI brain also revealed 2 extraaxial masses which appear most consistent with meningiomas. Will need add-on post-contrast MRI to further assess, per Radiology recommendation. 6. MRA head: Internal carotid artery widely patent without significant stenosis. Anterior and middle cerebral arteries patent without large vessel occlusion. Mild stenosis in middle cerebral artery branches bilaterally. Moderate to severe stenosis right A2 and A3 segments. Moderate stenosis left A3 segment. Both vertebral arteries patent to the basilar.  Basilar widely patent. Moderate stenosis distal posterior cerebral artery bilaterally. No large vessel occlusion 7. Carotid ultrasound: Right carotid artery system with less than 50% stenosis secondary to mild atherosclerotic plaque formation. Left carotid artery system with less than 50% stenosis secondary to mild atherosclerotic plaque formation. Vertebral artery system is patent with antegrade flow bilaterally.  Recommendations: 1. MRA head and carotid ultrasound are unrevealing. Will need TEE. Also will need Holter monitor or loop recorder to assess for possible PAF.   2. Add-on MRI brain with contrast to further characterize the meningiomas seen on initial non-contrast MRI (ordered) 3. HgbA1c pending 4. PT consult, OT consult, Speech consult 5. Agree with starting Plavix 6. On simvastatin 7. BP management. Out of permissive HTN time window.  8. Risk factor modification 9. Telemetry monitoring 10. Frequent neuro checks   Addendum: MRI add-on post-contrast study has been completed, revealing an enhancing extra-axial mass along the right frontal convexity. Partial and patchy enhancement is present in the lesion which measures approximately 17 x 9 mm and appears most consistent with a meningioma. Also noted is a 7 mm enhancing mass to the right of the falx anteriorly also consistent with meningioma.  @Electronically  signed: Dr. Kerney Elbe 01/18/2021, 10:19 AM

## 2021-01-18 NOTE — Evaluation (Signed)
Physical Therapy Evaluation Patient Details Name: Lindsey Barnes MRN: [id number] DOB: Feb 20, 1933 Today's Date: 01/18/2021   History of Present Illness  Pt is an 87 y/o F with PMH: anxiety, HTN and chronic pain who presented to ED d/t feeling tremor and weakness to her right upper extremity as well as weakness in her legs that caused her to fall to the floor. She was able to call a neighbor for assist. MRI revealed: Punctate acute left frontal lobe stroke.  Clinical Impression  Pt supine in bed follow [place] of carotid upon PT arrival and willing to participate in PT interventions. Pt able to complete bed mobility with SBA from therapist. She completed sit<>stand transfer with RW and minA with VCs for hand placement to avoid pulling on RW when completing the transfer. Pt able to stand EOB with arms crossed for 15 seconds without LOB, but when asked to close her eyes she immediately leaned posteriorly requiring minA from PT to maintain standing positioning without LOB. Completed 200 ft of ambulation using RW with CGA, VCs provided to pt to stand with a more upright posture and pursed lip breathing to manage fatigue. Pt able to follow multi step commands and respond appropriately to verbal cues, but she required increased time for processing. PT brought mobile stairs to room to assess stair negotiation. She completed 6 stairs up/down using RW and min-modA from therapist as she tends to posteriorly lean when she gets fatigued, VCs for sequencing with stair negotiation. Had dtr complete steps with pt to demonstrate compliance and understanding of stair negotiation. Pt demonstrated increased fatigue following interventions today, but tolerated PT well. Anticipate discharge with HHPT, with dtr assisting patient 24/7 for safety.        Follow Up Recommendations Home health PT    Equipment Recommendations       Recommendations for Other Services       Precautions / Restrictions Precautions Precautions:  Fall Restrictions Weight Bearing Restrictions: No      Mobility  Bed Mobility Overal bed mobility: Modified Independent             General bed mobility comments: Pt with increased fatigue required railing and icnreased time to complete transfer Patient Response: Impulsive  Transfers Overall transfer level: Needs assistance Equipment used: Rolling walker (2 wheeled) Transfers: Sit to/from Stand Sit to Stand: Min guard;Min assist         General transfer comment: Pt required minA at one point during transfer as pt tends to list posteriorly when standing upright  Ambulation/Gait Ambulation/Gait assistance: Min assist;Min guard Gait Distance (Feet): 200 Feet Assistive device: Rolling walker (2 wheeled) Gait Pattern/deviations: Step-through pattern;Decreased step length - right;Decreased step length - left Gait velocity: Decreased   General Gait Details: Pt with decreased gait velocity and increased reliance on RW to maintain safe upright positioining during ambulation due to fatigue and impaired balance  Stairs Stairs: Yes Stairs assistance: Mod assist;Min assist Stair Management: With walker Number of Stairs: 6 General stair comments: Completed one step 6 x in room with min-mod A for safety, reviewed with dtr appropriate safety measures and guarding to decrease pt risk for falls  Wheelchair Mobility    Modified Rankin (Stroke Patients Only)       Balance Overall balance assessment: Needs assistance Sitting-balance support: No upper extremity supported;Feet supported Sitting balance-Leahy Scale: Normal Sitting balance - Comments: Pt able to sit EOB safely with SBA as she tends to be spontaneous and trasnfer without assist   Standing balance support:  Bilateral upper extremity supported Standing balance-Leahy Scale: Fair Standing balance comment: Pt requires BIL UE support on RW standing EOB to maintain safe standing positioning. Pt able to stand with arms  crossed over chest for 15 seconds safely, but when pt closed her eyes she lean posteriorly and lost her balance requiring assistance from therapist to maintain upright positioning.                             Pertinent Vitals/Pain Pain Assessment: No/denies pain Pain Score: 4  Pain Location: R hip/"buttcheek" per pt Pain Descriptors / Indicators: Aching;Sore Pain Intervention(s): Limited activity within patient's tolerance;Monitored during session;Repositioned    Home Living Family/patient expects to be discharged to:: Private residence Living Arrangements: Alone Available Help at Discharge: Family;Available PRN/intermittently Type of Home: House Home Access: Stairs to enter Entrance Stairs-Rails: Left Entrance Stairs-Number of Steps: 6 from garage Home Layout: One level Home Equipment: [employer] - 2 wheels;Cane - single point;Shower seat - built in;Grab bars - toilet;Grab bars - tub/shower      Prior Function Level of Independence: Independent with assistive device(s)         Comments: Pt stated that she was modI with household chores and completing ADLs, dtr states that her mother has not been out to shop or run errands in the past 3 months     Hand Dominance        Extremity/Trunk Assessment   Upper Extremity Assessment Upper Extremity Assessment: Overall WFL for tasks assessed;Generalized weakness (ROM WFL, MMT grossly 4-/5, appropriate for age, sensation equal bilaterally.)    Lower Extremity Assessment Lower Extremity Assessment: RLE deficits/detail RLE Deficits / Details: Pt's RLE demonstrated decreased strength with MMT today RLE 3/5 globally    Cervical / Trunk Assessment Cervical / Trunk Assessment: Kyphotic (Mild increase in thoracic kyphosis)  Communication   Communication: No difficulties;HOH  Cognition Arousal/Alertness: Awake/alert Behavior During Therapy: WFL for tasks assessed/performed Overall Cognitive Status: Impaired/Different from  baseline Area of Impairment: Orientation;Attention;Memory;Following commands;Safety/judgement;Awareness;Problem solving                 Orientation Level: Person;Place;Time Current Attention Level: Focused Memory: Decreased recall of precautions Following Commands: Follows one step commands consistently;Follows multi-step commands with increased time Safety/Judgement: Decreased awareness of safety;Decreased awareness of deficits Awareness: Intellectual Problem Solving: Slow processing;Requires verbal cues General Comments: Pt able to follow multi step commands and answer questions appropriately during PT eval today, but she required some increased time. The dtr reports that her mom is not at her cognitive or functional baseline and does not feel comfortable taking her home today.      General Comments      Exercises Other Exercises Other Exercises: Educated pt on approrpriate use of RW for all ambulation within home to decrease her risk for falls. Reviewed stair negotiation and had dtr demonstrate compliance with appropriate guarding to decrease pt risk for falls.   Assessment/Plan    PT Assessment Patient needs continued PT services  PT Problem List Decreased strength;Decreased safety awareness;Decreased mobility;Decreased coordination;Decreased activity tolerance;Decreased cognition;Decreased knowledge of use of DME;Decreased balance       PT Treatment Interventions DME instruction;Gait training;Therapeutic exercise;Balance training;Stair training;Functional mobility training;Therapeutic activities;Patient/family education;Neuromuscular re-education    PT Goals (Current goals can be found in the Care Plan section)  Acute Rehab PT Goals Patient Stated Goal: to go home PT Goal Formulation: With patient/family Time For Goal Achievement: 02/01/21 Potential to Achieve Goals: Good  Frequency 7X/week   Barriers to discharge        Co-evaluation                AM-PAC PT "6 Clicks" Mobility  Outcome Measure Help needed turning from your back to your side while in a flat bed without using bedrails?: None Help needed moving from lying on your back to sitting on the side of a flat bed without using bedrails?: None Help needed moving to and from a bed to a chair (including a wheelchair)?: A Little Help needed standing up from a chair using your arms (e.g., wheelchair or bedside chair)?: A Little Help needed to walk in hospital room?: A Little Help needed climbing 3-5 steps with a railing? : A Lot 6 Click Score: 19    End of Session Equipment Utilized During Treatment: Gait belt Activity Tolerance: Patient tolerated treatment well Patient left: in bed;with call bell/phone within reach;with family/visitor present   PT Visit Diagnosis: Unsteadiness on feet (R26.81);Muscle weakness (generalized) (M62.81);History of falling (Z91.81)    Time: 2449-7530 PT Time Calculation (min) (ACUTE ONLY): 30 min   Charges:   PT Evaluation $PT Eval Low Complexity: 1 Low PT Treatments $Therapeutic Activity: 8-22 mins        Duanne Guess, PT, DPT 01/18/21, 1:12 PM   [name] 01/18/2021, 1:00 PM

## 2021-01-18 NOTE — Discharge Summary (Addendum)
Physician Discharge Summary  Patient ID: Lindsey Barnes MRN: [id number] DOB/AGE: Mar 11, 1933 87 y.o.  Admit date: 01/17/2021 Discharge date: 01/18/2021  Admission Diagnoses:  Discharge Diagnoses:  Principal Problem:   TIA (transient ischemic attack) Active Problems:   Compression fracture of T12 vertebra (HCC)   HTN (hypertension)   Hyperlipidemia   Low back pain   Depression, controlled   Insomnia, unspecified   Discharged Condition: good  Hospital Course:  Lindsey Barnes is a 87 y.o. female with medical history significant for hypertension, anxiety, depression, hyperlipidemia, history of cholecystectomy status post cholecystitis in 2019, history of right sided breast cancer s/p partial mastectomy, presents to the emergency department for chief concerns of right upper extremity weakness and numbness. MRI was performed, showed left frontal lobe cortical infarct.  Patient also had MR angiogram of the head did not show significant occlusion.  MRI head with contrast also confirmed meningioma at the right frontal cortex. Echocardiogram did not show any clots, ejection fraction normal.  Carotid ultrasound was also without significant occlusion. Patient has not been able to tolerate aspirin in the past, she is treated with Plavix.  She is also restarted on statin. Discharge after seen by neurology, I will also schedule for outpatient follow-up with neurology.  Patient also followed up with Dr. Ubaldo Glassing for cardiac monitoring as outpatient to rule out atrial fibrillation.  Addendum: daughter is uncomfortable take patient home today, wanted to go home tomorrow. Will continue telemetry to monitor for A fib.    Consults: neurology  Significant Diagnostic Studies:  Echo: 1. Left ventricular ejection fraction, by estimation, is 55 to 60%. The left ventricle has normal function. The left ventricle has no regional wall motion abnormalities. There is mild left ventricular hypertrophy.  Left ventricular diastolic parameters are consistent with Grade I diastolic dysfunction (impaired relaxation). 2. Right ventricular systolic function is normal. The right ventricular size is mildly enlarged. 3. Left atrial size was mildly dilated. 4. Right atrial size was mildly dilated. 5. The mitral valve was not well visualized. Trivial mitral valve regurgitation. 6. The aortic valve is grossly normal. Aortic valve regurgitation is not visualized.  MRA HEAD WITHOUT CONTRAST  TECHNIQUE: Angiographic images of the Circle of Willis were obtained using MRA technique without intravenous contrast.  COMPARISON:  MRI head 01/17/2021  FINDINGS: Internal carotid artery widely patent without significant stenosis. Anterior and middle cerebral arteries patent without large vessel occlusion. Mild stenosis in middle cerebral artery branches bilaterally. Moderate to severe stenosis right A2 and A3 segments. Moderate stenosis left A3 segment.  Both vertebral arteries patent to the basilar. Basilar widely patent. Moderate stenosis distal posterior cerebral artery bilaterally. No large vessel occlusion.  Negative for cerebral aneurysm.  IMPRESSION: Intracranial atherosclerotic disease.  No large vessel occlusion.   Electronically Signed   By: Franchot Gallo M.D.   On: 01/18/2021 12:55  MRI HEAD WITH CONTRAST  TECHNIQUE: Multiplanar, multiecho pulse sequences of the brain and surrounding structures were obtained with intravenous contrast.  CONTRAST:  7.72mL GADAVIST GADOBUTROL 1 MMOL/ML IV SOLN  COMPARISON:  MRI head 01/17/2021.  CT head 01/17/2021  FINDINGS: Brain: Enhancing extra-axial mass right frontal convexity. Partial and patchy enhancement is present in the lesion which measures approximately 17 x 9 mm. Probable meningioma. No brain edema on the FLAIR images yesterday.  7 mm enhancing mass to the right of the falx anteriorly also consistent with  meningioma.  Ventricle size normal. Patchy white matter changes bilaterally as noted on earlier MRI.  Vascular: Normal arterial  and venous enhancement.  Skull and upper cervical spine: No enhancing skull lesion identified.  Sinuses/Orbits: Negative  Other: None  IMPRESSION: Enhancing extra-axial mass right frontal convexity compatible with meningioma 17 x 9 mm  Seconds right frontal para falcine meningioma measuring 7 mm.   Electronically Signed   By: Franchot Gallo M.D.   On: 01/18/2021 12:51  BILATERAL CAROTID DUPLEX ULTRASOUND  TECHNIQUE: Pearline Cables scale imaging, color Doppler and duplex ultrasound were performed of bilateral carotid and vertebral arteries in the neck.  COMPARISON:  None.  FINDINGS: Criteria: Quantification of carotid stenosis is based on velocity parameters that correlate the residual internal carotid diameter with NASCET-based stenosis levels, using the diameter of the distal internal carotid lumen as the denominator for stenosis measurement.  The following velocity measurements were obtained:  RIGHT  ICA: Peak systolic velocity 88 cm/sec, End diastolic velocity 8 cm/sec  CCA: Peak systolic velocity 85 cm/sec  SYSTOLIC ICA/CCA RATIO:  1.0  ECA: Peak systolic velocity 95 cm/sec  LEFT  ICA: Peak systolic velocity 458 cm/sec, End diastolic velocity 13 cm/sec  CCA: 73 cm/sec  SYSTOLIC ICA/CCA RATIO:  1.4  ECA: 67 cm/sec  RIGHT CAROTID ARTERY: Mild focal atherosclerotic plaque formation in the common carotid artery. No significant tortuosity. Normal low resistance waveforms.  RIGHT VERTEBRAL ARTERY:  Antegrade flow.  LEFT CAROTID ARTERY: Mild focal atherosclerotic plaque formation in the proximal internal carotid artery. No significant tortuosity. Normal low resistance waveforms.  LEFT VERTEBRAL ARTERY:  Antegrade flow.  Upper extremity non-invasive blood pressures:  Not obtained  IMPRESSION: 1.  Right carotid artery system: Less than 50% stenosis secondary to mild atherosclerotic plaque formation.  2. Left carotid artery system: Less than 50% stenosis secondary to mild atherosclerotic plaque formation.  3.  Vertebral artery system: Patent with antegrade flow bilaterally.  Ruthann Cancer, MD  [facility]  [facility]   Electronically Signed   By: Ruthann Cancer MD   On: 01/18/2021 10:45  MRI HEAD WITHOUT CONTRAST  TECHNIQUE: Multiplanar, multiecho pulse sequences of the brain and surrounding structures were obtained without intravenous contrast.  COMPARISON:  Same day head CT.  FINDINGS: Brain: Punctate cortical infarct in the high left frontal lobe (series 5, image 47). Moderate patchy T2/FLAIR hyperintensities within the white matter and pons, nonspecific but most likely related to chronic microvascular ischemic disease. Focal T2/FLAIR hyperintensity in the right frontal cortex (series 10, image 20) likely represents a prior infarct. Numerous dilated perivascular spaces in bilateral basal ganglia. No hydrocephalus. No midline shift. No extra-axial fluid collections.  Approximately 8 mm extra-axial, dural-based mass along the anterior aspect of the right falx (series 10, image 18) which demonstrated mineralization on same day head CT and most likely represents a meningioma.  Additional 2 cm extra-axial, dural-based abnormality along the high right frontal convexity (series 15, image 45 and series 16, image 137) avidly restricts diffusion (series 5, image 46). No substantial mass effect or adjacent brain edema with either lesion  Vascular: Major arterial flow voids are maintained at the skull base.  Skull and upper cervical spine: Normal marrow signal.  Sinuses/Orbits: Mild ethmoid air cell mucosal thickening. Otherwise, clear sinuses.  Other: No mastoid effusions.  IMPRESSION: 1. Punctate  cortical infarct in the high left frontal lobe. No edema or mass effect. 2. Approximately 2 cm extra-axial dural-based abnormality along the high right frontal convexity most likely represents a meningioma, but the degree of diffusion restriction is atypical. Recommend post-contrast MRI to confirm and exclude other etiologies.  3. Additional 8 mm extra-axial lesion along right anterior falx is more characteristic of a meningioma, but can also be evaluated on the recommended post-contrast imaging. 4. Moderate chronic microvascular ischemic disease with suspected small focal remote right frontal cortical infarct.   Electronically Signed   By: Margaretha Sheffield MD   On: 01/17/2021 17:46  Treatments: Stroke workup  Discharge Exam: Blood pressure 138/70, pulse 89, temperature 98.2 F (36.8 C), resp. rate (!) 24, height 5\' 7"  (1.702 m), weight 86.2 kg, SpO2 97 %. General appearance: alert and cooperative Resp: clear to auscultation bilaterally Cardio: regular rate and rhythm, S1, S2 normal, no murmur, click, rub or gallop GI: soft, non-tender; bowel sounds normal; no masses,  no organomegaly Extremities: extremities normal, atraumatic, no cyanosis or edema  Disposition: Discharge disposition: 01-Home or Self Care       Discharge Instructions    Diet - low sodium heart healthy   Complete by: As directed    Increase activity slowly   Complete by: As directed      Allergies as of 01/18/2021      Reactions   Amoxicillin Swelling   Blisters.   Aspirin Other (See Comments)   Stomach ulcers.   Benadryl [diphenhydramine Hcl] Other (See Comments)   Hyperactive.      Medication List    STOP taking these medications   ALPRAZolam 0.25 MG tablet Commonly known as: XANAX     TAKE these medications   bisoprolol-hydrochlorothiazide 2.5-6.25 MG tablet Commonly known as: ZIAC Take 1 tablet by mouth daily. 5-6.25 tab   Calcium Carbonate-Vitamin D3 600-400 MG-UNIT Tabs Take 1  tablet by mouth 1 day or 1 dose.   cholecalciferol 1000 units tablet Commonly known as: VITAMIN D Take 1,000 Units by mouth daily.   clopidogrel 75 MG tablet Commonly known as: PLAVIX Take 1 tablet (75 mg total) by mouth daily. Start taking on: January 19, 2021   diazepam 2 MG tablet Commonly known as: VALIUM Take 2 mg by mouth every 6 (six) hours as needed for anxiety.   docusate sodium 100 MG capsule Commonly known as: COLACE Take 100 mg by mouth daily as needed for mild constipation or moderate constipation.   HYDROcodone-acetaminophen 5-325 MG tablet Commonly known as: NORCO/VICODIN Take 1-2 tablets by mouth every 4 (four) hours as needed for moderate pain.   HYDROcodone-acetaminophen 5-325 MG tablet Commonly known as: NORCO/VICODIN Take 1 tablet by mouth every 6 (six) hours as needed for moderate pain.   Klor-Con M20 20 MEQ tablet Generic drug: potassium chloride SA Take 20 mEq by mouth in the morning and at bedtime.   meclizine 25 MG tablet Commonly known as: ANTIVERT Take 25 mg by mouth 3 (three) times daily as needed. For dizziness   propranolol 20 MG tablet Commonly known as: INDERAL Take 20 mg by mouth 2 (two) times daily.   ranitidine 150 MG tablet Commonly known as: ZANTAC Take 150 mg by mouth 2 (two) times daily.   scopolamine 1 MG/3DAYS Commonly known as: TRANSDERM-SCOP Place 1 patch onto the skin every 3 (three) days. As needed   sertraline 100 MG tablet Commonly known as: ZOLOFT Take 50 mg by mouth 2 (two) times daily. What changed: Another medication with the same name was removed. Continue taking this medication, and follow the directions you see here.   simvastatin 40 MG tablet Commonly known as: ZOCOR Take 40 mg by mouth every evening.   traZODone 50 MG tablet Commonly known as: DESYREL Take 50 mg by  mouth at bedtime.   vitamin B-12 500 MCG tablet Commonly known as: CYANOCOBALAMIN Take 500 mcg by mouth daily.       Follow-up  Information    Juluis Pitch, MD Follow up in 1 week(s).   Specialty: Family Medicine Contact information: [address] [address] [phone]        [facility] Follow up in 2 week(s).   Contact information: [address] [phone]       Teodoro Spray, MD Follow up in 1 week(s).   Specialty: Cardiology Contact information: [place] [address] [phone]               Signed: Sharen Hones 01/18/2021, 1:20 PM

## 2021-01-18 NOTE — Evaluation (Signed)
Occupational Therapy Evaluation Patient Details Name: Lindsey Barnes MRN: [id number] DOB: [birth year] Today's Date: 01/18/2021    History of Present Illness pt is an 87 y/o F with PMH: anxiety, HTN and chronic pain who presented to ED d/t feeling tremor and weakness to her right upper extremity as well as weakness in her legs that caused her to fall to the floor. She was able to call a neighbor for assist. MRI revealed: Punctate acute left frontal lobe stroke.   Clinical Impression   Pt seen for OT evaluation this date in setting of acute hospitalization d/t CVA. Pt presents with most s/s largely resolved, but endorses some intermittent decreased sensation/numbness to her LEs. Pt is INDEP at baseline, living alone in [facility] with 6 STE with L railing. She drives and runs her own errands. Pt presents this date with decreased standing balance, decreased fxl activity tolerance, and general weakness as well some decreased sensation to LEs. Pt requires MIN A/CGA with ADL transfer with RW. Pt performs UB dressing and bathing with SETUP and LB in sitting with SETUP, but requires MIN A/CGA for all standing aspects for fall prevention. Pt left in chair with chair alarm to eat breakfast and hospitalist presents to speak to pt and her daughter. Anticipate pt will require HHOT f/u as well as BSC for safety with nocturnal voiding. Of note: also request that pt not resume driving until speaking with a physician and has gone to her follow up appts.    Follow Up Recommendations  Home health OT;Supervision - Intermittent    Equipment Recommendations  3 in 1 bedside commode    Recommendations for Other Services       Precautions / Restrictions Precautions Precautions: Fall Restrictions Weight Bearing Restrictions: No      Mobility Bed Mobility Overal bed mobility: Modified Independent             General bed mobility comments: increased time, HOB slightly elevated, use of bed rails. Was  effortful which she reports is not her norm    Transfers Overall transfer level: Needs assistance Equipment used: Straight cane;Rolling walker (2 wheeled) Transfers: Sit to/from Stand Sit to Stand: Min guard;Min assist         General transfer comment: initially trialed with [facility] as this is what pt typically uses, pt requires MIN A and demos significant unsteadiness although no gross LOB. When RW introduced, pt able to perform sit to stand with CGA. Still unsteady, but less phyical assistance    Balance Overall balance assessment: Needs assistance   Sitting balance-Leahy Scale: Normal       Standing balance-Leahy Scale: Fair Standing balance comment: requires UE support, while she usually only requires unilateral support, she definitely benefits from B UE support this date.                           ADL either performed or assessed with clinical judgement   ADL Overall ADL's : Needs assistance/impaired                                       General ADL Comments: SETUP for seated UB ADLs, MIN A for seated LB ADLs, MIN A/CGA for standing self care with RW for UE support (usually only needs unlateraly support, but noted to be weaker at this time)     Vision Baseline Vision/History: Wears glasses  Wears Glasses: At all times Patient Visual Report: No change from baseline       Perception     Praxis      Pertinent Vitals/Pain Pain Assessment: 0-10 Pain Score: 4  Pain Location: R hip/"buttcheek" per pt Pain Descriptors / Indicators: Aching;Sore Pain Intervention(s): Limited activity within patient's tolerance;Monitored during session;Repositioned     Hand Dominance     Extremity/Trunk Assessment Upper Extremity Assessment Upper Extremity Assessment: Overall WFL for tasks assessed;Generalized weakness (ROM WFL, MMT grossly 4-/5, appropriate for age, sensation equal bilaterally.)   Lower Extremity Assessment Lower Extremity Assessment: Defer  to PT evaluation;Generalized weakness (noted to retropulse in standing/lose balance posteriorly. Endorses waxing/waning sensation to LEs since yeaterday (not so much loss of sensation, but numbness/decrease))       Communication Communication Communication: No difficulties   Cognition Arousal/Alertness: Awake/alert Behavior During Therapy: WFL for tasks assessed/performed Overall Cognitive Status: Impaired/Different from baseline Area of Impairment: Orientation;Attention;Memory;Following commands;Safety/judgement;Awareness;Problem solving                 Orientation Level: Disoriented to;Situation Current Attention Level: Focused Memory: Decreased recall of precautions Following Commands: Follows one step commands consistently;Follows one step commands with increased time Safety/Judgement: Decreased awareness of safety;Decreased awareness of deficits Awareness: Intellectual Problem Solving: Slow processing;Requires verbal cues General Comments: Pt is mostly appropriate conversationally and with commands/cues, but does mix up some details with home setup and PLOF which her daughter corrects as she is present throughout assessment. Pt is mostly oriented, but some confusion on situation and delay with date/day.   General Comments       Exercises Other Exercises Other Exercises: OT facilitates ed re: role of OT, f/u recommendations, importance of strengthening and OOB activity, fall prevention including use of chair alarm and call button.   Shoulder Instructions      Home Living Family/patient expects to be discharged to:: Private residence Living Arrangements: Alone Available Help at Discharge: Family;Available PRN/intermittently (dtr lives ~.25 mi from pt and states she will be able to stay with her a few days and check on her every day after that.) Type of Home: House Home Access: Stairs to enter [employer] of Steps: 6 from garage Entrance Stairs-Rails: Left (b/l  rails to front entrance, but too wide to reach both) Home Layout: One level     Bathroom Shower/Tub: Teacher, early years/pre: Handicapped height     Home Equipment: Environmental consultant - 2 wheels;Cane - single point;Shower seat - built in;Grab bars - toilet;Grab bars - tub/shower          Prior Functioning/Environment Level of Independence: Independent with assistive device(s)        Comments: Pt reports beign able to drive, run her own errands, compeltes all ADLs and IADLs herself at baseline. Use of SPC for fxl mobility in home and community. Pt's daughter reports that pt will often furniture cruise with opposite arm not utilzing cane.        OT Problem List: Decreased strength;Decreased activity tolerance;Impaired balance (sitting and/or standing);Decreased coordination;Decreased safety awareness;Decreased knowledge of use of DME or AE      OT Treatment/Interventions: Self-care/ADL training;Therapeutic exercise;DME and/or AE instruction;Therapeutic activities;Balance training;Patient/family education    OT Goals(Current goals can be found in the care plan section) Acute Rehab OT Goals Patient Stated Goal: to go home OT Goal Formulation: With patient Time For Goal Achievement: 02/01/21 Potential to Achieve Goals: Fair  OT Frequency: Min 1X/week   Barriers to D/C:  Co-evaluation              AM-PAC OT "6 Clicks" Daily Activity     Outcome Measure Help from another person eating meals?: None Help from another person taking care of personal grooming?: A Little Help from another person toileting, which includes using toliet, bedpan, or urinal?: A Little Help from another person bathing (including washing, rinsing, drying)?: A Little Help from another person to put on and taking off regular upper body clothing?: None Help from another person to put on and taking off regular lower body clothing?: A Little 6 Click Score: 20   End of Session Equipment  Utilized During Treatment: Gait belt;Rolling walker Nurse Communication: Mobility status  Activity Tolerance: Patient tolerated treatment well Patient left: with call bell/phone within reach;in chair;with chair alarm set;with family/visitor present  OT Visit Diagnosis: Unsteadiness on feet (R26.81);Muscle weakness (generalized) (M62.81);Other symptoms and signs involving the nervous system (T02.111)                Time: 7356-7014 OT Time Calculation (min): 58 min Charges:  OT General Charges $OT Visit: 1 Visit OT Evaluation $OT Eval Moderate Complexity: 1 Mod OT Treatments $Self Care/Home Management : 23-37 mins $Therapeutic Activity: 23-37 mins  Gerrianne Scale, MS, OTR/L ascom [phone] 01/18/21, 12:01 PM

## 2021-01-18 NOTE — Progress Notes (Signed)
Dr. Roosevelt Locks stated that patient would be staying with [place] for tonight and would D/C home tomorrow. Notified Dr. Roosevelt Locks that discharge order had not been removed. He confirmed via secure chat that patient is not going home today and to cancel discharge order in system. Verified by charge nurse Malka. Removed order according to Dr. Roosevelt Locks. Patient resting in bed with call bell in reach.

## 2021-01-18 NOTE — Discharge Instructions (Signed)
Fall Prevention in the Home, Adult Falls can cause injuries and can happen to people of all ages. There are many things you can do to make your home safe and to help prevent falls. Ask for help when making these changes. What actions can I take to prevent falls? General Instructions  Use good lighting in all rooms. Replace any light bulbs that burn out.  Turn on the lights in dark areas. Use night-lights.  Keep items that you use often in easy-to-reach places. Lower the shelves around your home if needed.  Set up your furniture so you have a clear path. Avoid moving your furniture around.  Do not have throw rugs or other things on the floor that can make you trip.  Avoid walking on wet floors.  If any of your floors are uneven, fix them.  Add color or contrast paint or tape to clearly mark and help you see: ? Grab bars or handrails. ? First and last steps of staircases. ? Where the edge of each step is.  If you use a stepladder: ? Make sure that it is fully opened. Do not climb a closed stepladder. ? Make sure the sides of the stepladder are locked in place. ? Ask someone to hold the stepladder while you use it.  Know where your pets are when moving through your home. What can I do in the bathroom?  Keep the floor dry. Clean up any water on the floor right away.  Remove soap buildup in the tub or shower.  Use nonskid mats or decals on the floor of the tub or shower.  Attach bath mats securely with double-sided, nonslip rug tape.  If you need to sit down in the shower, use a plastic, nonslip stool.  Install grab bars by the toilet and in the tub and shower. Do not use towel bars as grab bars.      What can I do in the bedroom?  Make sure that you have a light by your bed that is easy to reach.  Do not use any sheets or blankets for your bed that hang to the floor.  Have a firm chair with side arms that you can use for support when you get dressed. What can I do in  the kitchen?  Clean up any spills right away.  If you need to reach something above you, use a step stool with a grab bar.  Keep electrical cords out of the way.  Do not use floor polish or wax that makes floors slippery. What can I do with my stairs?  Do not leave any items on the stairs.  Make sure that you have a light switch at the top and the bottom of the stairs.  Make sure that there are handrails on both sides of the stairs. Fix handrails that are broken or loose.  Install nonslip stair treads on all your stairs.  Avoid having throw rugs at the top or bottom of the stairs.  Choose a carpet that does not hide the edge of the steps on the stairs.  Check carpeting to make sure that it is firmly attached to the stairs. Fix carpet that is loose or worn. What can I do on the outside of my home?  Use bright outdoor lighting.  Fix the edges of walkways and driveways and fix any cracks.  Remove anything that might make you trip as you walk through a door, such as a raised step or threshold.  Trim any   bushes or trees on paths to your home.  Check to see if handrails are loose or broken and that both sides of all steps have handrails.  Install guardrails along the edges of any raised decks and porches.  Clear paths of anything that can make you trip, such as tools or rocks.  Have leaves, snow, or ice cleared regularly.  Use sand or salt on paths during winter.  Clean up any spills in your garage right away. This includes grease or oil spills. What other actions can I take?  Wear shoes that: ? Have a low heel. Do not wear high heels. ? Have rubber bottoms. ? Feel good on your feet and fit well. ? Are closed at the toe. Do not wear open-toe sandals.  Use tools that help you move around if needed. These include: ? Canes. ? Walkers. ? Scooters. ? Crutches.  Review your medicines with your doctor. Some medicines can make you feel dizzy. This can increase your chance  of falling. Ask your doctor what else you can do to help prevent falls. Where to find more information  Centers for Disease Control and Prevention, [employer]: www.cdc.gov  National Institute on Aging: www.nia.nih.gov Contact a doctor if:  You are afraid of falling at home.  You feel weak, drowsy, or dizzy at home.  You fall at home. Summary  There are many simple things that you can do to make your home safe and to help prevent falls.  Ways to make your home safe include removing things that can make you trip and installing grab bars in the bathroom.  Ask for help when making these changes in your home. This information is not intended to replace advice given to you by your health care provider. Make sure you discuss any questions you have with your health care provider. Document Revised: 05/01/2020 Document Reviewed: 05/01/2020 [employer] Patient Education  2021 [employer]  

## 2021-01-19 DIAGNOSIS — I10 Essential (primary) hypertension: Secondary | ICD-10-CM | POA: Diagnosis not present

## 2021-01-19 NOTE — Discharge Summary (Signed)
Physician Discharge Summary  Patient ID: Lindsey Barnes MRN: [id number] DOB/AGE: Apr 28, 1933 87 y.o.  Admit date: 01/17/2021 Discharge date: 01/19/2021  Admission Diagnoses:  Discharge Diagnoses:  Active Problems:   Compression fracture of T12 vertebra (HCC)   HTN (hypertension)   Hyperlipidemia   Low back pain   Depression, controlled   Insomnia, unspecified   Acute ischemic left ACA stroke Lindsey Barnes)   Discharged Condition: good  Barnes Course:  Lindsey Leisey Barbouris a 87 y.o.femalewith medical history significant forhypertension, anxiety, depression, hyperlipidemia, history of cholecystectomy status post cholecystitis in 2019,history of right sided breast Barnes s/p partial mastectomy,presents to the emergency department for chief concerns of right upper extremity weakness and numbness. MRI was performed, showed left frontal lobe cortical infarct.  Patient also had MR angiogram of the head did not show significant occlusion.  MRI head with contrast also confirmed meningioma at the right frontal cortex. Echocardiogram did not show any clots, ejection fraction normal.  Carotid ultrasound was also without significant occlusion. Patient has not been able to tolerate aspirin in the past, she is treated with Plavix.  She is also restarted on statin. Discharge after seen by neurology, I will also schedule for outpatient follow-up with neurology.  Patient also followed up with Dr. Ubaldo Glassing for cardiac monitoring as outpatient to rule out atrial fibrillation.  Patient was scheduled to be discharged yesterday, but daughter was uncomfortable taking her home.  She was monitored on telemetry, did not show any atrial fibrillation or arrhythmia.  She was seen by her therapist, she was able to walk in the hallway.  At this point she is medically stable to be discharged.   Consults: neurology  Significant Diagnostic Studies:  Echo: 1. Left ventricular ejection fraction, by estimation, is  55 to 60%. The left ventricle has normal function. The left ventricle has no regional wall motion abnormalities. There is mild left ventricular hypertrophy. Left ventricular diastolic parameters are consistent with Grade I diastolic dysfunction (impaired relaxation). 2. Right ventricular systolic function is normal. The right ventricular size is mildly enlarged. 3. Left atrial size was mildly dilated. 4. Right atrial size was mildly dilated. 5. The mitral valve was not well visualized. Trivial mitral valve regurgitation. 6. The aortic valve is grossly normal. Aortic valve regurgitation is not visualized.  MRA HEAD WITHOUT CONTRAST  TECHNIQUE: Angiographic images of the Circle of Willis were obtained using MRA technique without intravenous contrast.  COMPARISON: MRI head 01/17/2021  FINDINGS: Internal carotid artery widely patent without significant stenosis. Anterior and middle cerebral arteries patent without large vessel occlusion. Mild stenosis in middle cerebral artery branches bilaterally. Moderate to severe stenosis right A2 and A3 segments. Moderate stenosis left A3 segment.  Both vertebral arteries patent to the basilar. Basilar widely patent. Moderate stenosis distal posterior cerebral artery bilaterally. No large vessel occlusion.  Negative for cerebral aneurysm.  IMPRESSION: Intracranial atherosclerotic disease. No large vessel occlusion.   Electronically Signed By: Franchot Gallo M.D. On: 01/18/2021 12:55  MRI HEAD WITH CONTRAST  TECHNIQUE: Multiplanar, multiecho pulse sequences of the brain and surrounding structures were obtained with intravenous contrast.  CONTRAST: 7.76mL GADAVIST GADOBUTROL 1 MMOL/ML IV SOLN  COMPARISON: MRI head 01/17/2021. CT head 01/17/2021  FINDINGS: Brain: Enhancing extra-axial mass right frontal convexity. Partial and patchy enhancement is present in the lesion which measures approximately 17 x 9 mm.  Probable meningioma. No brain edema on the FLAIR images yesterday.  7 mm enhancing mass to the right of the falx anteriorly also consistent with meningioma.  Ventricle  size normal. Patchy white matter changes bilaterally as noted on earlier MRI.  Vascular: Normal arterial and venous enhancement.  Skull and upper cervical spine: No enhancing skull lesion identified.  Sinuses/Orbits: Negative  Other: None  IMPRESSION: Enhancing extra-axial mass right frontal convexity compatible with meningioma 17 x 9 mm  Seconds right frontal para falcine meningioma measuring 7 mm.   Electronically Signed By: Franchot Gallo M.D. On: 01/18/2021 12:51  BILATERAL CAROTID DUPLEX ULTRASOUND  TECHNIQUE: Pearline Cables scale imaging, color Doppler and duplex ultrasound were performed of bilateral carotid and vertebral arteries in the neck.  COMPARISON: None.  FINDINGS: Criteria: Quantification of carotid stenosis is based on velocity parameters that correlate the residual internal carotid diameter with NASCET-based stenosis levels, using the diameter of the distal internal carotid lumen as the denominator for stenosis measurement.  The following velocity measurements were obtained:  RIGHT  ICA: Peak systolic velocity 88 cm/sec, End diastolic velocity 8 cm/sec  CCA: Peak systolic velocity 85 cm/sec  SYSTOLIC ICA/CCA RATIO: 1.0  ECA: Peak systolic velocity 95 cm/sec  LEFT  ICA: Peak systolic velocity 725 cm/sec, End diastolic velocity 13 cm/sec  CCA: 73 cm/sec  SYSTOLIC ICA/CCA RATIO: 1.4  ECA: 67 cm/sec  RIGHT CAROTID ARTERY: Mild focal atherosclerotic plaque formation in the common carotid artery. No significant tortuosity. Normal low resistance waveforms.  RIGHT VERTEBRAL ARTERY: Antegrade flow.  LEFT CAROTID ARTERY: Mild focal atherosclerotic plaque formation in the proximal internal carotid artery. No significant tortuosity. Normal low  resistance waveforms.  LEFT VERTEBRAL ARTERY: Antegrade flow.  Upper extremity non-invasive blood pressures:  Not obtained  IMPRESSION: 1. Right carotid artery system: Less than 50% stenosis secondary to mild atherosclerotic plaque formation.  2. Left carotid artery system: Less than 50% stenosis secondary to mild atherosclerotic plaque formation.  3. Vertebral artery system: Patent with antegrade flow bilaterally.  Lindsey Cancer, MD  [facility]  [facility]   Electronically Signed By: Lindsey Cancer MD On: 01/18/2021 10:45  MRI HEAD WITHOUT CONTRAST  TECHNIQUE: Multiplanar, multiecho pulse sequences of the brain and surrounding structures were obtained without intravenous contrast.  COMPARISON: Same day head CT.  FINDINGS: Brain: Punctate cortical infarct in the high left frontal lobe (series 5, image 47). Moderate patchy T2/FLAIR hyperintensities within the white matter and pons, nonspecific but most likely related to chronic microvascular ischemic disease. Focal T2/FLAIR hyperintensity in the right frontal cortex (series 10, image 20) likely represents a prior infarct. Numerous dilated perivascular spaces in bilateral basal ganglia. No hydrocephalus. No midline shift. No extra-axial fluid collections.  Approximately 8 mm extra-axial, dural-based mass along the anterior aspect of the right falx (series 10, image 18) which demonstrated mineralization on same day head CT and most likely represents a meningioma.  Additional 2 cm extra-axial, dural-based abnormality along the high right frontal convexity (series 15, image 45 and series 16, image 137) avidly restricts diffusion (series 5, image 46). No substantial mass effect or adjacent brain edema with either lesion  Vascular: Major arterial flow voids are maintained at the skull base.  Skull and upper cervical spine: Normal marrow  signal.  Sinuses/Orbits: Mild ethmoid air cell mucosal thickening. Otherwise, clear sinuses.  Other: No mastoid effusions.  IMPRESSION: 1. Punctate cortical infarct in the high left frontal lobe. No edema or mass effect. 2. Approximately 2 cm extra-axial dural-based abnormality along the high right frontal convexity most likely represents a meningioma, but the degree of diffusion restriction is atypical. Recommend post-contrast MRI to confirm and exclude other etiologies. 3.  Additional 8 mm extra-axial lesion along right anterior falx is more characteristic of a meningioma, but can also be evaluated on the recommended post-contrast imaging. 4. Moderate chronic microvascular ischemic disease with suspected small focal remote right frontal cortical infarct.   Electronically Signed By: Margaretha Sheffield MD On: 01/17/2021 17:46   Treatments: stroke workup  Discharge Exam: Blood pressure (!) 138/91, pulse (!) 104, temperature 98.1 F (36.7 C), resp. rate 19, height 5\' 7"  (1.702 m), weight 86.2 kg, SpO2 93 %. General appearance: alert and cooperative Resp: clear to auscultation bilaterally Cardio: regular rate and rhythm, S1, S2 normal, no murmur, click, rub or gallop GI: soft, non-tender; bowel sounds normal; no masses,  no organomegaly Extremities: extremities normal, atraumatic, no cyanosis or edema  Disposition: Discharge disposition: 01-Home or Self Care       Discharge Instructions    Diet - low sodium heart healthy   Complete by: As directed    Increase activity slowly   Complete by: As directed      Allergies as of 01/19/2021      Reactions   Amoxicillin Swelling   Blisters.   Aspirin Other (See Comments)   Stomach ulcers.   Benadryl [diphenhydramine Hcl] Other (See Comments)   Hyperactive.      Medication List    STOP taking these medications   ALPRAZolam 0.25 MG tablet Commonly known as: XANAX     TAKE these medications    bisoprolol-hydrochlorothiazide 2.5-6.25 MG tablet Commonly known as: ZIAC Take 1 tablet by mouth daily. 5-6.25 tab   Calcium Carbonate-Vitamin D3 600-400 MG-UNIT Tabs Take 1 tablet by mouth 1 day or 1 dose.   cholecalciferol 1000 units tablet Commonly known as: VITAMIN D Take 1,000 Units by mouth daily.   clopidogrel 75 MG tablet Commonly known as: PLAVIX Take 1 tablet (75 mg total) by mouth daily.   diazepam 2 MG tablet Commonly known as: VALIUM Take 2 mg by mouth every 6 (six) hours as needed for anxiety.   docusate sodium 100 MG capsule Commonly known as: COLACE Take 100 mg by mouth daily as needed for mild constipation or moderate constipation.   HYDROcodone-acetaminophen 5-325 MG tablet Commonly known as: NORCO/VICODIN Take 1-2 tablets by mouth every 4 (four) hours as needed for moderate pain.   HYDROcodone-acetaminophen 5-325 MG tablet Commonly known as: NORCO/VICODIN Take 1 tablet by mouth every 6 (six) hours as needed for moderate pain.   Klor-Con M20 20 MEQ tablet Generic drug: potassium chloride SA Take 20 mEq by mouth in the morning and at bedtime.   meclizine 25 MG tablet Commonly known as: ANTIVERT Take 25 mg by mouth 3 (three) times daily as needed. For dizziness   propranolol 20 MG tablet Commonly known as: INDERAL Take 20 mg by mouth 2 (two) times daily.   ranitidine 150 MG tablet Commonly known as: ZANTAC Take 150 mg by mouth 2 (two) times daily.   scopolamine 1 MG/3DAYS Commonly known as: TRANSDERM-SCOP Place 1 patch onto the skin every 3 (three) days. As needed   sertraline 100 MG tablet Commonly known as: ZOLOFT Take 50 mg by mouth 2 (two) times daily. What changed: Another medication with the same name was removed. Continue taking this medication, and follow the directions you see here.   simvastatin 40 MG tablet Commonly known as: ZOCOR Take 40 mg by mouth every evening.   traZODone 50 MG tablet Commonly known as: DESYREL Take 50  mg by mouth at bedtime.   vitamin B-12 500  MCG tablet Commonly known as: CYANOCOBALAMIN Take 500 mcg by mouth daily.       Follow-up Information    Juluis Pitch, MD Follow up in 1 week(s).   Specialty: Family Medicine Contact information: [address] [address] [phone]        [facility] Follow up in 2 week(s).   Contact information: [place] [phone]       Teodoro Spray, MD Follow up in 1 week(s).   Specialty: Cardiology Contact information: [place] [address] [phone]               Signed: Sharen Hones 01/19/2021, 10:08 AM

## 2021-01-19 NOTE — Progress Notes (Signed)
Physical Therapy Treatment Patient Details Name: Lindsey Barnes MRN: [id number] DOB: 03/25/1933 Today's Date: 01/19/2021    History of Present Illness Pt is an 87 y/o F with PMH: anxiety, HTN and chronic pain who presented to ED d/t feeling tremor and weakness to her right upper extremity as well as weakness in her legs that caused her to fall to the floor. She was able to call a neighbor for assist. MRI revealed: Punctate acute left frontal lobe stroke.    PT Comments    Pt received by PT today seated in recliner, willing to participate in PT interventions today. Pt able to complete sit<>stand transfer with CGA, VCs for hand placement using RW. Pt completed ambulation with RW with minA for 22f, VCs for posture and PLB throughout. Pt negotiated 5 steps with a step to pattern and mod reliance on railing. Pt required 2 standing rest breaks with PLB due to increased fatigue with ambulation and stair negotiation. Following ambulation pt required a seated rest break. Pt completed ambulation to the rest room and back to the recliner before reporting she was too fatigued to continue with more PT interventions. Pt left seated in recliner with dtr seated EOB, call bell within reach and all needs met at this time. HHPT continues to be an appropriate for pt at discharge.     Follow Up Recommendations  Home health PT     Equipment Recommendations       Recommendations for Other Services       Precautions / Restrictions Precautions Precautions: Fall Restrictions Weight Bearing Restrictions: No    Mobility  Bed Mobility                    Transfers Overall transfer level: Needs assistance Equipment used: Rolling walker (2 wheeled) Transfers: Sit to/from Stand Sit to Stand: Min guard         General transfer comment: Pt required VCs for appropriate hand placement using RW  Ambulation/Gait Ambulation/Gait assistance: Min assist;Min guard Gait Distance (Feet): 200  Feet Assistive device: Rolling walker (2 wheeled) Gait Pattern/deviations: Step-through pattern;Decreased step length - right;Decreased step length - left Gait velocity: Decreased   General Gait Details: Pt with decreased gait velocity and increased reliance on RW to maintain safe upright positioining during ambulation due to fatigue and impaired balance   Stairs   Stairs assistance: Min guard Stair Management: With walker Number of Stairs: 5 General stair comments: Pt able to navigate 5 steps with step to pattern on steps with minA from therpist and moderate reliance on the railing   Wheelchair Mobility    Modified Rankin (Stroke Patients Only)       Balance Overall balance assessment: Needs assistance Sitting-balance support: No upper extremity supported;Feet supported Sitting balance-Leahy Scale: Normal Sitting balance - Comments: Pt safe seated EOB   Standing balance support: Bilateral upper extremity supported Standing balance-Leahy Scale: Good Standing balance comment: Pt demonstrating improved standing balance tody, but continues to require BIL UE support on RW. She still tends to list posteriorly when she becomes off balance, but she was able to maintain balance with no LOB through activities today                            Cognition Arousal/Alertness: Awake/alert Behavior During Therapy: WFL for tasks assessed/performed Overall Cognitive Status: Within Functional Limits for tasks assessed  Following Commands: Follows multi-step commands consistently Safety/Judgement: Decreased awareness of safety     General Comments: Pt still slightly impulsive, needs reminding to go slow and use RW as she is use to using [facility]      Exercises      General Comments        Pertinent Vitals/Pain Pain Assessment: No/denies pain    Home Living                      Prior Function            PT Goals (current goals  can now be found in the care plan section) Acute Rehab PT Goals Patient Stated Goal: to go home PT Goal Formulation: With patient/family Time For Goal Achievement: 02/01/21 Potential to Achieve Goals: Good Progress towards PT goals: Progressing toward goals    Frequency    7X/week      PT Plan Current plan remains appropriate    Co-evaluation              AM-PAC PT "6 Clicks" Mobility   Outcome Measure  Help needed turning from your back to your side while in a flat bed without using bedrails?: None Help needed moving from lying on your back to sitting on the side of a flat bed without using bedrails?: None Help needed moving to and from a bed to a chair (including a wheelchair)?: A Little Help needed standing up from a chair using your arms (e.g., wheelchair or bedside chair)?: A Little Help needed to walk in hospital room?: A Little Help needed climbing 3-5 steps with a railing? : A Lot 6 Click Score: 19    End of Session Equipment Utilized During Treatment: Gait belt Activity Tolerance: Patient tolerated treatment well Patient left: in chair;with call bell/phone within reach;with family/visitor present   PT Visit Diagnosis: Unsteadiness on feet (R26.81);Muscle weakness (generalized) (M62.81);History of falling (Z91.81)     Time: 9012-2241 PT Time Calculation (min) (ACUTE ONLY): 31 min  Charges:  $Gait Training: 23-37 mins                     Lindsey Barnes, PT, DPT 01/19/21, 12:24 PM    Lindsey Barnes 01/19/2021, 12:18 PM

## 2021-01-27 DIAGNOSIS — I10 Essential (primary) hypertension: Secondary | ICD-10-CM | POA: Diagnosis not present

## 2021-01-27 DIAGNOSIS — I38 Endocarditis, valve unspecified: Secondary | ICD-10-CM | POA: Diagnosis not present

## 2021-01-27 DIAGNOSIS — G459 Transient cerebral ischemic attack, unspecified: Secondary | ICD-10-CM | POA: Diagnosis not present

## 2021-01-27 DIAGNOSIS — E782 Mixed hyperlipidemia: Secondary | ICD-10-CM | POA: Diagnosis not present

## 2021-01-27 DIAGNOSIS — Z853 Personal history of malignant neoplasm of breast: Secondary | ICD-10-CM | POA: Diagnosis not present

## 2021-01-27 DIAGNOSIS — I63522 Cerebral infarction due to unspecified occlusion or stenosis of left anterior cerebral artery: Secondary | ICD-10-CM | POA: Diagnosis not present

## 2021-01-28 DIAGNOSIS — I63522 Cerebral infarction due to unspecified occlusion or stenosis of left anterior cerebral artery: Secondary | ICD-10-CM | POA: Diagnosis not present

## 2021-01-28 DIAGNOSIS — E782 Mixed hyperlipidemia: Secondary | ICD-10-CM | POA: Diagnosis not present

## 2021-01-28 DIAGNOSIS — I10 Essential (primary) hypertension: Secondary | ICD-10-CM | POA: Diagnosis not present

## 2021-01-28 NOTE — Progress Notes (Cosign Needed)
[place]  Occupational Therapy Certification  Patient Details  Name: Lindsey Barnes MRN: [id number] Date of Birth: Aug 28, 1933 Medical Diagnosis: Active Problems:   Compression fracture of T12 vertebra (HCC)   HTN (hypertension)   Hyperlipidemia   Low back pain   Depression, controlled   Insomnia, unspecified   Acute ischemic left ACA stroke (HCC)  Visit Diagnosis: OT Visit Diagnosis: Unsteadiness on feet (R26.81),Muscle weakness (generalized) (M62.81),Other symptoms and signs involving the nervous system (R29.898) OT Problem: Decreased strength,Decreased activity tolerance,Impaired balance (sitting and/or standing),Decreased coordination,Decreased safety awareness,Decreased knowledge of use of DME or AE  Goals:    Duration: Services will be provided through the following date: 02/01/21  Frequency: Min 1X/week  Amount: One treatment session per day unless otherwise indicated:  Certification Start Date: 12/07/7503 Certification End Date: 02/01/21   OT Treatments/Interventions: Self-care/ADL training,Therapeutic exercise,DME and/or AE instruction,Therapeutic activities,Balance training,Patient/family education   Raunak Antuna H. Owens Shark, PT, DPT, NCS 01/28/21, 3:00 PM [phone]

## 2021-01-28 NOTE — Progress Notes (Cosign Needed)
[place]  Physical Therapy Certification  Patient Details  Name: Lindsey Barnes MRN: [id number] Date of Birth: 02-Aug-1933 Medical Diagnosis: Active Problems:   Compression fracture of T12 vertebra (HCC)   HTN (hypertension)   Hyperlipidemia   Low back pain   Depression, controlled   Insomnia, unspecified   Acute ischemic left ACA stroke (HCC)  Visit Diagnosis: PT Visit Diagnosis: Unsteadiness on feet (R26.81),Muscle weakness (generalized) (M62.81),History of falling (Z91.81) PT Problem: Decreased strength,Decreased safety awareness,Decreased mobility,Decreased coordination,Decreased activity tolerance,Decreased cognition,Decreased knowledge of use of DME,Decreased balance  Goals: Patient will transfer sit to/from stand: with supervision Pt will Ambulate: > 125 feet,with minimal assist Pt will Go Up / Down Stairs: with minimal assist  Duration: Services will be provided through the following date: 02/01/21  Frequency: 7X/week  Amount: one treatment session per day unless otherwise indicated.    Certification Start Date: 1/83/4373 Certification End Date: 02/01/21   PT Treatments/Interventions: DME instruction,Gait training,Therapeutic exercise,Balance training,Stair training,Functional mobility training,Therapeutic activities,Patient/family education,Neuromuscular re-education   Ronal Maybury H. Owens Shark, PT, DPT, NCS 01/28/21, 2:59 PM [phone]

## 2021-01-30 DIAGNOSIS — Z853 Personal history of malignant neoplasm of breast: Secondary | ICD-10-CM | POA: Diagnosis not present

## 2021-01-30 DIAGNOSIS — M810 Age-related osteoporosis without current pathological fracture: Secondary | ICD-10-CM | POA: Diagnosis not present

## 2021-01-30 DIAGNOSIS — I69351 Hemiplegia and hemiparesis following cerebral infarction affecting right dominant side: Secondary | ICD-10-CM | POA: Diagnosis not present

## 2021-01-30 DIAGNOSIS — Z9181 History of falling: Secondary | ICD-10-CM | POA: Diagnosis not present

## 2021-01-30 DIAGNOSIS — Z85828 Personal history of other malignant neoplasm of skin: Secondary | ICD-10-CM | POA: Diagnosis not present

## 2021-01-30 DIAGNOSIS — I119 Hypertensive heart disease without heart failure: Secondary | ICD-10-CM | POA: Diagnosis not present

## 2021-01-30 DIAGNOSIS — F32A Depression, unspecified: Secondary | ICD-10-CM | POA: Diagnosis not present

## 2021-01-30 DIAGNOSIS — K219 Gastro-esophageal reflux disease without esophagitis: Secondary | ICD-10-CM | POA: Diagnosis not present

## 2021-01-30 DIAGNOSIS — E782 Mixed hyperlipidemia: Secondary | ICD-10-CM | POA: Diagnosis not present

## 2021-01-30 DIAGNOSIS — M4854XD Collapsed vertebra, not elsewhere classified, thoracic region, subsequent encounter for fracture with routine healing: Secondary | ICD-10-CM | POA: Diagnosis not present

## 2021-01-30 DIAGNOSIS — G4700 Insomnia, unspecified: Secondary | ICD-10-CM | POA: Diagnosis not present

## 2021-01-30 DIAGNOSIS — Z7902 Long term (current) use of antithrombotics/antiplatelets: Secondary | ICD-10-CM | POA: Diagnosis not present

## 2021-01-30 DIAGNOSIS — I639 Cerebral infarction, unspecified: Secondary | ICD-10-CM | POA: Diagnosis not present

## 2021-01-31 DIAGNOSIS — R5383 Other fatigue: Secondary | ICD-10-CM | POA: Diagnosis not present

## 2021-01-31 DIAGNOSIS — Z8673 Personal history of transient ischemic attack (TIA), and cerebral infarction without residual deficits: Secondary | ICD-10-CM | POA: Diagnosis not present

## 2021-01-31 DIAGNOSIS — R531 Weakness: Secondary | ICD-10-CM | POA: Diagnosis not present

## 2021-01-31 DIAGNOSIS — D329 Benign neoplasm of meninges, unspecified: Secondary | ICD-10-CM | POA: Diagnosis not present

## 2021-01-31 DIAGNOSIS — R262 Difficulty in walking, not elsewhere classified: Secondary | ICD-10-CM | POA: Diagnosis not present

## 2021-02-05 DIAGNOSIS — M4854XD Collapsed vertebra, not elsewhere classified, thoracic region, subsequent encounter for fracture with routine healing: Secondary | ICD-10-CM | POA: Diagnosis not present

## 2021-02-05 DIAGNOSIS — G4700 Insomnia, unspecified: Secondary | ICD-10-CM | POA: Diagnosis not present

## 2021-02-05 DIAGNOSIS — I69351 Hemiplegia and hemiparesis following cerebral infarction affecting right dominant side: Secondary | ICD-10-CM | POA: Diagnosis not present

## 2021-02-05 DIAGNOSIS — M810 Age-related osteoporosis without current pathological fracture: Secondary | ICD-10-CM | POA: Diagnosis not present

## 2021-02-05 DIAGNOSIS — F32A Depression, unspecified: Secondary | ICD-10-CM | POA: Diagnosis not present

## 2021-02-05 DIAGNOSIS — E782 Mixed hyperlipidemia: Secondary | ICD-10-CM | POA: Diagnosis not present

## 2021-02-05 DIAGNOSIS — I119 Hypertensive heart disease without heart failure: Secondary | ICD-10-CM | POA: Diagnosis not present

## 2021-02-10 DIAGNOSIS — M810 Age-related osteoporosis without current pathological fracture: Secondary | ICD-10-CM | POA: Diagnosis not present

## 2021-02-10 DIAGNOSIS — K219 Gastro-esophageal reflux disease without esophagitis: Secondary | ICD-10-CM | POA: Diagnosis not present

## 2021-02-10 DIAGNOSIS — I119 Hypertensive heart disease without heart failure: Secondary | ICD-10-CM | POA: Diagnosis not present

## 2021-02-10 DIAGNOSIS — F32A Depression, unspecified: Secondary | ICD-10-CM | POA: Diagnosis not present

## 2021-02-10 DIAGNOSIS — M4854XD Collapsed vertebra, not elsewhere classified, thoracic region, subsequent encounter for fracture with routine healing: Secondary | ICD-10-CM | POA: Diagnosis not present

## 2021-02-10 DIAGNOSIS — I69351 Hemiplegia and hemiparesis following cerebral infarction affecting right dominant side: Secondary | ICD-10-CM | POA: Diagnosis not present

## 2021-02-10 DIAGNOSIS — Z853 Personal history of malignant neoplasm of breast: Secondary | ICD-10-CM | POA: Diagnosis not present

## 2021-02-10 DIAGNOSIS — E782 Mixed hyperlipidemia: Secondary | ICD-10-CM | POA: Diagnosis not present

## 2021-02-10 DIAGNOSIS — Z9181 History of falling: Secondary | ICD-10-CM | POA: Diagnosis not present

## 2021-02-10 DIAGNOSIS — Z7902 Long term (current) use of antithrombotics/antiplatelets: Secondary | ICD-10-CM | POA: Diagnosis not present

## 2021-02-10 DIAGNOSIS — Z85828 Personal history of other malignant neoplasm of skin: Secondary | ICD-10-CM | POA: Diagnosis not present

## 2021-02-10 DIAGNOSIS — G4700 Insomnia, unspecified: Secondary | ICD-10-CM | POA: Diagnosis not present

## 2021-02-10 DIAGNOSIS — I639 Cerebral infarction, unspecified: Secondary | ICD-10-CM | POA: Diagnosis not present

## 2021-02-25 DIAGNOSIS — I639 Cerebral infarction, unspecified: Secondary | ICD-10-CM | POA: Diagnosis not present

## 2021-03-19 DIAGNOSIS — I63522 Cerebral infarction due to unspecified occlusion or stenosis of left anterior cerebral artery: Secondary | ICD-10-CM | POA: Diagnosis not present

## 2021-04-02 DIAGNOSIS — M810 Age-related osteoporosis without current pathological fracture: Secondary | ICD-10-CM | POA: Diagnosis not present

## 2021-04-09 DIAGNOSIS — E876 Hypokalemia: Secondary | ICD-10-CM | POA: Diagnosis not present

## 2021-04-09 DIAGNOSIS — M810 Age-related osteoporosis without current pathological fracture: Secondary | ICD-10-CM | POA: Diagnosis not present

## 2021-04-15 DIAGNOSIS — M25562 Pain in left knee: Secondary | ICD-10-CM | POA: Diagnosis not present

## 2021-04-15 DIAGNOSIS — Z1331 Encounter for screening for depression: Secondary | ICD-10-CM | POA: Diagnosis not present

## 2021-04-15 DIAGNOSIS — M810 Age-related osteoporosis without current pathological fracture: Secondary | ICD-10-CM | POA: Diagnosis not present

## 2021-04-15 DIAGNOSIS — E782 Mixed hyperlipidemia: Secondary | ICD-10-CM | POA: Diagnosis not present

## 2021-04-15 DIAGNOSIS — I63522 Cerebral infarction due to unspecified occlusion or stenosis of left anterior cerebral artery: Secondary | ICD-10-CM | POA: Diagnosis not present

## 2021-04-15 DIAGNOSIS — F32A Depression, unspecified: Secondary | ICD-10-CM | POA: Diagnosis not present

## 2021-04-15 DIAGNOSIS — Z0000 Encounter for general adult medical examination without abnormal findings: Secondary | ICD-10-CM | POA: Diagnosis not present

## 2021-04-15 DIAGNOSIS — Z853 Personal history of malignant neoplasm of breast: Secondary | ICD-10-CM | POA: Diagnosis not present

## 2021-04-15 DIAGNOSIS — I10 Essential (primary) hypertension: Secondary | ICD-10-CM | POA: Diagnosis not present

## 2021-04-16 DIAGNOSIS — M25562 Pain in left knee: Secondary | ICD-10-CM | POA: Diagnosis not present

## 2021-04-25 DIAGNOSIS — M810 Age-related osteoporosis without current pathological fracture: Secondary | ICD-10-CM | POA: Diagnosis not present

## 2021-05-16 DIAGNOSIS — M25562 Pain in left knee: Secondary | ICD-10-CM | POA: Diagnosis not present

## 2021-06-02 DIAGNOSIS — L570 Actinic keratosis: Secondary | ICD-10-CM | POA: Diagnosis not present

## 2021-06-02 DIAGNOSIS — D225 Melanocytic nevi of trunk: Secondary | ICD-10-CM | POA: Diagnosis not present

## 2021-06-02 DIAGNOSIS — D2262 Melanocytic nevi of left upper limb, including shoulder: Secondary | ICD-10-CM | POA: Diagnosis not present

## 2021-06-02 DIAGNOSIS — Z85828 Personal history of other malignant neoplasm of skin: Secondary | ICD-10-CM | POA: Diagnosis not present

## 2021-06-02 DIAGNOSIS — L821 Other seborrheic keratosis: Secondary | ICD-10-CM | POA: Diagnosis not present

## 2021-06-02 DIAGNOSIS — D2271 Melanocytic nevi of right lower limb, including hip: Secondary | ICD-10-CM | POA: Diagnosis not present

## 2021-06-02 DIAGNOSIS — X32XXXA Exposure to sunlight, initial encounter: Secondary | ICD-10-CM | POA: Diagnosis not present

## 2021-06-02 DIAGNOSIS — D2261 Melanocytic nevi of right upper limb, including shoulder: Secondary | ICD-10-CM | POA: Diagnosis not present

## 2021-06-09 DIAGNOSIS — M3501 Sicca syndrome with keratoconjunctivitis: Secondary | ICD-10-CM | POA: Diagnosis not present

## 2021-07-16 DIAGNOSIS — I10 Essential (primary) hypertension: Secondary | ICD-10-CM | POA: Diagnosis not present

## 2021-07-16 DIAGNOSIS — I63522 Cerebral infarction due to unspecified occlusion or stenosis of left anterior cerebral artery: Secondary | ICD-10-CM | POA: Diagnosis not present

## 2021-07-16 DIAGNOSIS — R251 Tremor, unspecified: Secondary | ICD-10-CM | POA: Diagnosis not present

## 2021-07-16 DIAGNOSIS — I38 Endocarditis, valve unspecified: Secondary | ICD-10-CM | POA: Diagnosis not present

## 2021-07-16 DIAGNOSIS — E782 Mixed hyperlipidemia: Secondary | ICD-10-CM | POA: Diagnosis not present

## 2021-07-21 DIAGNOSIS — Z8673 Personal history of transient ischemic attack (TIA), and cerebral infarction without residual deficits: Secondary | ICD-10-CM | POA: Diagnosis not present

## 2021-07-21 DIAGNOSIS — R5383 Other fatigue: Secondary | ICD-10-CM | POA: Diagnosis not present

## 2021-07-21 DIAGNOSIS — R262 Difficulty in walking, not elsewhere classified: Secondary | ICD-10-CM | POA: Diagnosis not present

## 2021-07-21 DIAGNOSIS — R531 Weakness: Secondary | ICD-10-CM | POA: Diagnosis not present

## 2021-07-21 DIAGNOSIS — H539 Unspecified visual disturbance: Secondary | ICD-10-CM | POA: Diagnosis not present

## 2021-10-10 DIAGNOSIS — M810 Age-related osteoporosis without current pathological fracture: Secondary | ICD-10-CM | POA: Diagnosis not present

## 2021-10-10 DIAGNOSIS — E782 Mixed hyperlipidemia: Secondary | ICD-10-CM | POA: Diagnosis not present

## 2021-10-10 DIAGNOSIS — I10 Essential (primary) hypertension: Secondary | ICD-10-CM | POA: Diagnosis not present

## 2021-10-28 DIAGNOSIS — I10 Essential (primary) hypertension: Secondary | ICD-10-CM | POA: Diagnosis not present

## 2021-10-28 DIAGNOSIS — M810 Age-related osteoporosis without current pathological fracture: Secondary | ICD-10-CM | POA: Diagnosis not present

## 2021-10-28 DIAGNOSIS — F419 Anxiety disorder, unspecified: Secondary | ICD-10-CM | POA: Diagnosis not present

## 2021-10-28 DIAGNOSIS — E782 Mixed hyperlipidemia: Secondary | ICD-10-CM | POA: Diagnosis not present

## 2021-10-30 DIAGNOSIS — M810 Age-related osteoporosis without current pathological fracture: Secondary | ICD-10-CM | POA: Diagnosis not present

## 2021-11-03 DIAGNOSIS — E782 Mixed hyperlipidemia: Secondary | ICD-10-CM | POA: Diagnosis not present

## 2021-11-03 DIAGNOSIS — I10 Essential (primary) hypertension: Secondary | ICD-10-CM | POA: Diagnosis not present

## 2021-12-26 DIAGNOSIS — I10 Essential (primary) hypertension: Secondary | ICD-10-CM | POA: Diagnosis not present

## 2022-01-13 DIAGNOSIS — H903 Sensorineural hearing loss, bilateral: Secondary | ICD-10-CM | POA: Diagnosis not present

## 2022-01-19 ENCOUNTER — Other Ambulatory Visit: Payer: Self-pay | Admitting: Physician Assistant

## 2022-01-19 DIAGNOSIS — Z8673 Personal history of transient ischemic attack (TIA), and cerebral infarction without residual deficits: Secondary | ICD-10-CM | POA: Diagnosis not present

## 2022-01-19 DIAGNOSIS — R5383 Other fatigue: Secondary | ICD-10-CM | POA: Diagnosis not present

## 2022-01-19 DIAGNOSIS — D329 Benign neoplasm of meninges, unspecified: Secondary | ICD-10-CM

## 2022-01-19 DIAGNOSIS — R519 Headache, unspecified: Secondary | ICD-10-CM | POA: Diagnosis not present

## 2022-01-19 DIAGNOSIS — R531 Weakness: Secondary | ICD-10-CM | POA: Diagnosis not present

## 2022-01-19 DIAGNOSIS — R262 Difficulty in walking, not elsewhere classified: Secondary | ICD-10-CM | POA: Diagnosis not present

## 2022-01-21 ENCOUNTER — Ambulatory Visit
Admission: RE | Admit: 2022-01-21 | Discharge: 2022-01-21 | Disposition: A | Discharge status: Home / Self Care | Payer: PPO | Source: Ambulatory Visit | Attending: Physician Assistant | Admitting: Physician Assistant

## 2022-01-21 DIAGNOSIS — D329 Benign neoplasm of meninges, unspecified: Secondary | ICD-10-CM | POA: Diagnosis not present

## 2022-01-21 DIAGNOSIS — R220 Localized swelling, mass and lump, head: Secondary | ICD-10-CM | POA: Diagnosis not present

## 2022-01-21 DIAGNOSIS — I639 Cerebral infarction, unspecified: Secondary | ICD-10-CM | POA: Diagnosis not present

## 2022-01-21 MED ORDER — GADOBUTROL 1 MMOL/ML IV SOLN
7.5000 mL | Freq: Once | INTRAVENOUS | Status: AC | PRN
  Administered 2022-01-21: 7.5 mL via INTRAVENOUS

## 2022-03-16 DIAGNOSIS — M810 Age-related osteoporosis without current pathological fracture: Secondary | ICD-10-CM | POA: Diagnosis not present

## 2022-04-28 DIAGNOSIS — Z0000 Encounter for general adult medical examination without abnormal findings: Secondary | ICD-10-CM | POA: Diagnosis not present

## 2022-05-02 DIAGNOSIS — K219 Gastro-esophageal reflux disease without esophagitis: Secondary | ICD-10-CM | POA: Diagnosis not present

## 2022-05-02 DIAGNOSIS — M810 Age-related osteoporosis without current pathological fracture: Secondary | ICD-10-CM | POA: Diagnosis not present

## 2022-05-02 DIAGNOSIS — Z853 Personal history of malignant neoplasm of breast: Secondary | ICD-10-CM | POA: Diagnosis not present

## 2022-05-02 DIAGNOSIS — E782 Mixed hyperlipidemia: Secondary | ICD-10-CM | POA: Diagnosis not present

## 2022-05-02 DIAGNOSIS — G4700 Insomnia, unspecified: Secondary | ICD-10-CM | POA: Diagnosis not present

## 2022-05-02 DIAGNOSIS — Z85828 Personal history of other malignant neoplasm of skin: Secondary | ICD-10-CM | POA: Diagnosis not present

## 2022-05-02 DIAGNOSIS — F419 Anxiety disorder, unspecified: Secondary | ICD-10-CM | POA: Diagnosis not present

## 2022-05-02 DIAGNOSIS — M170 Bilateral primary osteoarthritis of knee: Secondary | ICD-10-CM | POA: Diagnosis not present

## 2022-05-02 DIAGNOSIS — Z7902 Long term (current) use of antithrombotics/antiplatelets: Secondary | ICD-10-CM | POA: Diagnosis not present

## 2022-05-02 DIAGNOSIS — Z9181 History of falling: Secondary | ICD-10-CM | POA: Diagnosis not present

## 2022-05-02 DIAGNOSIS — I119 Hypertensive heart disease without heart failure: Secondary | ICD-10-CM | POA: Diagnosis not present

## 2022-05-02 DIAGNOSIS — B029 Zoster without complications: Secondary | ICD-10-CM | POA: Diagnosis not present

## 2022-05-02 DIAGNOSIS — F32A Depression, unspecified: Secondary | ICD-10-CM | POA: Diagnosis not present

## 2022-05-02 DIAGNOSIS — I69351 Hemiplegia and hemiparesis following cerebral infarction affecting right dominant side: Secondary | ICD-10-CM | POA: Diagnosis not present

## 2022-05-02 DIAGNOSIS — D329 Benign neoplasm of meninges, unspecified: Secondary | ICD-10-CM | POA: Diagnosis not present

## 2022-05-05 DIAGNOSIS — F32A Depression, unspecified: Secondary | ICD-10-CM | POA: Diagnosis not present

## 2022-05-05 DIAGNOSIS — I119 Hypertensive heart disease without heart failure: Secondary | ICD-10-CM | POA: Diagnosis not present

## 2022-05-05 DIAGNOSIS — I69351 Hemiplegia and hemiparesis following cerebral infarction affecting right dominant side: Secondary | ICD-10-CM | POA: Diagnosis not present

## 2022-05-05 DIAGNOSIS — F419 Anxiety disorder, unspecified: Secondary | ICD-10-CM | POA: Diagnosis not present

## 2022-05-05 DIAGNOSIS — M810 Age-related osteoporosis without current pathological fracture: Secondary | ICD-10-CM | POA: Diagnosis not present

## 2022-05-05 DIAGNOSIS — D329 Benign neoplasm of meninges, unspecified: Secondary | ICD-10-CM | POA: Diagnosis not present

## 2022-05-05 DIAGNOSIS — M170 Bilateral primary osteoarthritis of knee: Secondary | ICD-10-CM | POA: Diagnosis not present

## 2022-05-07 DIAGNOSIS — M810 Age-related osteoporosis without current pathological fracture: Secondary | ICD-10-CM | POA: Diagnosis not present

## 2022-05-11 DIAGNOSIS — Z9181 History of falling: Secondary | ICD-10-CM | POA: Diagnosis not present

## 2022-05-11 DIAGNOSIS — Z7902 Long term (current) use of antithrombotics/antiplatelets: Secondary | ICD-10-CM | POA: Diagnosis not present

## 2022-05-11 DIAGNOSIS — M170 Bilateral primary osteoarthritis of knee: Secondary | ICD-10-CM | POA: Diagnosis not present

## 2022-05-11 DIAGNOSIS — B029 Zoster without complications: Secondary | ICD-10-CM | POA: Diagnosis not present

## 2022-05-11 DIAGNOSIS — M810 Age-related osteoporosis without current pathological fracture: Secondary | ICD-10-CM | POA: Diagnosis not present

## 2022-05-11 DIAGNOSIS — E782 Mixed hyperlipidemia: Secondary | ICD-10-CM | POA: Diagnosis not present

## 2022-05-11 DIAGNOSIS — F419 Anxiety disorder, unspecified: Secondary | ICD-10-CM | POA: Diagnosis not present

## 2022-05-11 DIAGNOSIS — I119 Hypertensive heart disease without heart failure: Secondary | ICD-10-CM | POA: Diagnosis not present

## 2022-05-11 DIAGNOSIS — G4700 Insomnia, unspecified: Secondary | ICD-10-CM | POA: Diagnosis not present

## 2022-05-11 DIAGNOSIS — Z853 Personal history of malignant neoplasm of breast: Secondary | ICD-10-CM | POA: Diagnosis not present

## 2022-05-11 DIAGNOSIS — Z85828 Personal history of other malignant neoplasm of skin: Secondary | ICD-10-CM | POA: Diagnosis not present

## 2022-05-11 DIAGNOSIS — K219 Gastro-esophageal reflux disease without esophagitis: Secondary | ICD-10-CM | POA: Diagnosis not present

## 2022-05-11 DIAGNOSIS — D329 Benign neoplasm of meninges, unspecified: Secondary | ICD-10-CM | POA: Diagnosis not present

## 2022-05-11 DIAGNOSIS — F32A Depression, unspecified: Secondary | ICD-10-CM | POA: Diagnosis not present

## 2022-05-11 DIAGNOSIS — I69351 Hemiplegia and hemiparesis following cerebral infarction affecting right dominant side: Secondary | ICD-10-CM | POA: Diagnosis not present

## 2022-05-21 DIAGNOSIS — B029 Zoster without complications: Secondary | ICD-10-CM | POA: Diagnosis not present

## 2022-05-21 DIAGNOSIS — Z85828 Personal history of other malignant neoplasm of skin: Secondary | ICD-10-CM | POA: Diagnosis not present

## 2022-05-21 DIAGNOSIS — I119 Hypertensive heart disease without heart failure: Secondary | ICD-10-CM | POA: Diagnosis not present

## 2022-05-21 DIAGNOSIS — R0609 Other forms of dyspnea: Secondary | ICD-10-CM | POA: Diagnosis not present

## 2022-05-21 DIAGNOSIS — D329 Benign neoplasm of meninges, unspecified: Secondary | ICD-10-CM | POA: Diagnosis not present

## 2022-05-21 DIAGNOSIS — I10 Essential (primary) hypertension: Secondary | ICD-10-CM | POA: Diagnosis not present

## 2022-05-21 DIAGNOSIS — G4700 Insomnia, unspecified: Secondary | ICD-10-CM | POA: Diagnosis not present

## 2022-05-21 DIAGNOSIS — Z853 Personal history of malignant neoplasm of breast: Secondary | ICD-10-CM | POA: Diagnosis not present

## 2022-05-21 DIAGNOSIS — Z8673 Personal history of transient ischemic attack (TIA), and cerebral infarction without residual deficits: Secondary | ICD-10-CM | POA: Diagnosis not present

## 2022-05-21 DIAGNOSIS — I340 Nonrheumatic mitral (valve) insufficiency: Secondary | ICD-10-CM | POA: Diagnosis not present

## 2022-05-21 DIAGNOSIS — E782 Mixed hyperlipidemia: Secondary | ICD-10-CM | POA: Diagnosis not present

## 2022-05-21 DIAGNOSIS — K219 Gastro-esophageal reflux disease without esophagitis: Secondary | ICD-10-CM | POA: Diagnosis not present

## 2022-05-21 DIAGNOSIS — M170 Bilateral primary osteoarthritis of knee: Secondary | ICD-10-CM | POA: Diagnosis not present

## 2022-05-21 DIAGNOSIS — R251 Tremor, unspecified: Secondary | ICD-10-CM | POA: Diagnosis not present

## 2022-05-21 DIAGNOSIS — M810 Age-related osteoporosis without current pathological fracture: Secondary | ICD-10-CM | POA: Diagnosis not present

## 2022-05-21 DIAGNOSIS — Z9181 History of falling: Secondary | ICD-10-CM | POA: Diagnosis not present

## 2022-05-21 DIAGNOSIS — Z7902 Long term (current) use of antithrombotics/antiplatelets: Secondary | ICD-10-CM | POA: Diagnosis not present

## 2022-05-21 DIAGNOSIS — F419 Anxiety disorder, unspecified: Secondary | ICD-10-CM | POA: Diagnosis not present

## 2022-05-21 DIAGNOSIS — I69351 Hemiplegia and hemiparesis following cerebral infarction affecting right dominant side: Secondary | ICD-10-CM | POA: Diagnosis not present

## 2022-05-21 DIAGNOSIS — F32A Depression, unspecified: Secondary | ICD-10-CM | POA: Diagnosis not present

## 2022-06-01 DIAGNOSIS — R0609 Other forms of dyspnea: Secondary | ICD-10-CM | POA: Diagnosis not present

## 2022-06-01 DIAGNOSIS — I340 Nonrheumatic mitral (valve) insufficiency: Secondary | ICD-10-CM | POA: Diagnosis not present

## 2022-06-11 DIAGNOSIS — R5383 Other fatigue: Secondary | ICD-10-CM | POA: Diagnosis not present

## 2022-06-11 DIAGNOSIS — I10 Essential (primary) hypertension: Secondary | ICD-10-CM | POA: Diagnosis not present

## 2022-06-11 DIAGNOSIS — M3501 Sicca syndrome with keratoconjunctivitis: Secondary | ICD-10-CM | POA: Diagnosis not present

## 2022-06-11 DIAGNOSIS — I340 Nonrheumatic mitral (valve) insufficiency: Secondary | ICD-10-CM | POA: Diagnosis not present

## 2022-06-15 DIAGNOSIS — D329 Benign neoplasm of meninges, unspecified: Secondary | ICD-10-CM | POA: Diagnosis not present

## 2022-06-15 DIAGNOSIS — Z853 Personal history of malignant neoplasm of breast: Secondary | ICD-10-CM | POA: Diagnosis not present

## 2022-06-15 DIAGNOSIS — M170 Bilateral primary osteoarthritis of knee: Secondary | ICD-10-CM | POA: Diagnosis not present

## 2022-06-15 DIAGNOSIS — F32A Depression, unspecified: Secondary | ICD-10-CM | POA: Diagnosis not present

## 2022-06-15 DIAGNOSIS — M810 Age-related osteoporosis without current pathological fracture: Secondary | ICD-10-CM | POA: Diagnosis not present

## 2022-06-15 DIAGNOSIS — I119 Hypertensive heart disease without heart failure: Secondary | ICD-10-CM | POA: Diagnosis not present

## 2022-06-15 DIAGNOSIS — Z7902 Long term (current) use of antithrombotics/antiplatelets: Secondary | ICD-10-CM | POA: Diagnosis not present

## 2022-06-15 DIAGNOSIS — E782 Mixed hyperlipidemia: Secondary | ICD-10-CM | POA: Diagnosis not present

## 2022-06-15 DIAGNOSIS — B029 Zoster without complications: Secondary | ICD-10-CM | POA: Diagnosis not present

## 2022-06-15 DIAGNOSIS — Z85828 Personal history of other malignant neoplasm of skin: Secondary | ICD-10-CM | POA: Diagnosis not present

## 2022-06-15 DIAGNOSIS — F419 Anxiety disorder, unspecified: Secondary | ICD-10-CM | POA: Diagnosis not present

## 2022-06-15 DIAGNOSIS — G4700 Insomnia, unspecified: Secondary | ICD-10-CM | POA: Diagnosis not present

## 2022-06-15 DIAGNOSIS — Z9181 History of falling: Secondary | ICD-10-CM | POA: Diagnosis not present

## 2022-06-15 DIAGNOSIS — K219 Gastro-esophageal reflux disease without esophagitis: Secondary | ICD-10-CM | POA: Diagnosis not present

## 2022-06-15 DIAGNOSIS — I69351 Hemiplegia and hemiparesis following cerebral infarction affecting right dominant side: Secondary | ICD-10-CM | POA: Diagnosis not present

## 2022-06-17 DIAGNOSIS — Z85828 Personal history of other malignant neoplasm of skin: Secondary | ICD-10-CM | POA: Diagnosis not present

## 2022-06-17 DIAGNOSIS — D225 Melanocytic nevi of trunk: Secondary | ICD-10-CM | POA: Diagnosis not present

## 2022-06-17 DIAGNOSIS — L570 Actinic keratosis: Secondary | ICD-10-CM | POA: Diagnosis not present

## 2022-06-17 DIAGNOSIS — D2272 Melanocytic nevi of left lower limb, including hip: Secondary | ICD-10-CM | POA: Diagnosis not present

## 2022-06-17 DIAGNOSIS — D2262 Melanocytic nevi of left upper limb, including shoulder: Secondary | ICD-10-CM | POA: Diagnosis not present

## 2022-06-17 DIAGNOSIS — D2261 Melanocytic nevi of right upper limb, including shoulder: Secondary | ICD-10-CM | POA: Diagnosis not present

## 2022-08-12 DIAGNOSIS — R2689 Other abnormalities of gait and mobility: Secondary | ICD-10-CM | POA: Diagnosis not present

## 2022-08-12 DIAGNOSIS — H8113 Benign paroxysmal vertigo, bilateral: Secondary | ICD-10-CM | POA: Diagnosis not present

## 2022-08-12 DIAGNOSIS — Z8673 Personal history of transient ischemic attack (TIA), and cerebral infarction without residual deficits: Secondary | ICD-10-CM | POA: Diagnosis not present

## 2022-08-12 DIAGNOSIS — R262 Difficulty in walking, not elsewhere classified: Secondary | ICD-10-CM | POA: Diagnosis not present

## 2022-08-12 DIAGNOSIS — R42 Dizziness and giddiness: Secondary | ICD-10-CM | POA: Diagnosis not present

## 2022-08-19 DIAGNOSIS — R42 Dizziness and giddiness: Secondary | ICD-10-CM | POA: Diagnosis not present

## 2022-08-24 DIAGNOSIS — R42 Dizziness and giddiness: Secondary | ICD-10-CM | POA: Diagnosis not present

## 2022-09-18 DIAGNOSIS — R262 Difficulty in walking, not elsewhere classified: Secondary | ICD-10-CM | POA: Diagnosis not present

## 2022-09-18 DIAGNOSIS — R2689 Other abnormalities of gait and mobility: Secondary | ICD-10-CM | POA: Diagnosis not present

## 2022-10-30 DIAGNOSIS — E782 Mixed hyperlipidemia: Secondary | ICD-10-CM | POA: Diagnosis not present

## 2022-10-30 DIAGNOSIS — I10 Essential (primary) hypertension: Secondary | ICD-10-CM | POA: Diagnosis not present

## 2022-10-30 DIAGNOSIS — F419 Anxiety disorder, unspecified: Secondary | ICD-10-CM | POA: Diagnosis not present

## 2022-10-30 DIAGNOSIS — M810 Age-related osteoporosis without current pathological fracture: Secondary | ICD-10-CM | POA: Diagnosis not present

## 2022-11-11 DIAGNOSIS — M810 Age-related osteoporosis without current pathological fracture: Secondary | ICD-10-CM | POA: Diagnosis not present

## 2022-12-10 DIAGNOSIS — R0609 Other forms of dyspnea: Secondary | ICD-10-CM | POA: Diagnosis not present

## 2022-12-10 DIAGNOSIS — I340 Nonrheumatic mitral (valve) insufficiency: Secondary | ICD-10-CM | POA: Diagnosis not present

## 2022-12-10 DIAGNOSIS — I10 Essential (primary) hypertension: Secondary | ICD-10-CM | POA: Diagnosis not present

## 2022-12-10 DIAGNOSIS — E782 Mixed hyperlipidemia: Secondary | ICD-10-CM | POA: Diagnosis not present

## 2023-04-30 DIAGNOSIS — D329 Benign neoplasm of meninges, unspecified: Secondary | ICD-10-CM | POA: Diagnosis not present

## 2023-04-30 DIAGNOSIS — F419 Anxiety disorder, unspecified: Secondary | ICD-10-CM | POA: Diagnosis not present

## 2023-04-30 DIAGNOSIS — M810 Age-related osteoporosis without current pathological fracture: Secondary | ICD-10-CM | POA: Diagnosis not present

## 2023-04-30 DIAGNOSIS — I10 Essential (primary) hypertension: Secondary | ICD-10-CM | POA: Diagnosis not present

## 2023-04-30 DIAGNOSIS — Z0000 Encounter for general adult medical examination without abnormal findings: Secondary | ICD-10-CM | POA: Diagnosis not present

## 2023-04-30 DIAGNOSIS — E782 Mixed hyperlipidemia: Secondary | ICD-10-CM | POA: Diagnosis not present

## 2023-04-30 DIAGNOSIS — Z7409 Other reduced mobility: Secondary | ICD-10-CM | POA: Diagnosis not present

## 2023-05-10 DIAGNOSIS — D2272 Melanocytic nevi of left lower limb, including hip: Secondary | ICD-10-CM | POA: Diagnosis not present

## 2023-05-10 DIAGNOSIS — D485 Neoplasm of uncertain behavior of skin: Secondary | ICD-10-CM | POA: Diagnosis not present

## 2023-05-10 DIAGNOSIS — D2262 Melanocytic nevi of left upper limb, including shoulder: Secondary | ICD-10-CM | POA: Diagnosis not present

## 2023-05-10 DIAGNOSIS — D2261 Melanocytic nevi of right upper limb, including shoulder: Secondary | ICD-10-CM | POA: Diagnosis not present

## 2023-05-10 DIAGNOSIS — D225 Melanocytic nevi of trunk: Secondary | ICD-10-CM | POA: Diagnosis not present

## 2023-05-10 DIAGNOSIS — H61001 Unspecified perichondritis of right external ear: Secondary | ICD-10-CM | POA: Diagnosis not present

## 2023-05-10 DIAGNOSIS — L538 Other specified erythematous conditions: Secondary | ICD-10-CM | POA: Diagnosis not present

## 2023-05-10 DIAGNOSIS — Z85828 Personal history of other malignant neoplasm of skin: Secondary | ICD-10-CM | POA: Diagnosis not present

## 2023-05-10 DIAGNOSIS — L0889 Other specified local infections of the skin and subcutaneous tissue: Secondary | ICD-10-CM | POA: Diagnosis not present

## 2023-05-10 DIAGNOSIS — B078 Other viral warts: Secondary | ICD-10-CM | POA: Diagnosis not present

## 2023-05-10 DIAGNOSIS — R238 Other skin changes: Secondary | ICD-10-CM | POA: Diagnosis not present

## 2023-05-14 DIAGNOSIS — M810 Age-related osteoporosis without current pathological fracture: Secondary | ICD-10-CM | POA: Diagnosis not present

## 2023-05-25 DIAGNOSIS — M810 Age-related osteoporosis without current pathological fracture: Secondary | ICD-10-CM | POA: Diagnosis not present

## 2023-05-25 LAB — HM DEXA SCAN

## 2023-06-08 DIAGNOSIS — M810 Age-related osteoporosis without current pathological fracture: Secondary | ICD-10-CM | POA: Diagnosis not present

## 2023-07-02 DIAGNOSIS — H26492 Other secondary cataract, left eye: Secondary | ICD-10-CM | POA: Diagnosis not present

## 2023-07-02 DIAGNOSIS — H538 Other visual disturbances: Secondary | ICD-10-CM | POA: Diagnosis not present

## 2023-07-02 DIAGNOSIS — D3132 Benign neoplasm of left choroid: Secondary | ICD-10-CM | POA: Diagnosis not present

## 2023-07-02 DIAGNOSIS — H43813 Vitreous degeneration, bilateral: Secondary | ICD-10-CM | POA: Diagnosis not present

## 2023-07-02 DIAGNOSIS — H04123 Dry eye syndrome of bilateral lacrimal glands: Secondary | ICD-10-CM | POA: Diagnosis not present

## 2023-08-13 DIAGNOSIS — R2689 Other abnormalities of gait and mobility: Secondary | ICD-10-CM | POA: Diagnosis not present

## 2023-08-13 DIAGNOSIS — H8113 Benign paroxysmal vertigo, bilateral: Secondary | ICD-10-CM | POA: Diagnosis not present

## 2023-08-13 DIAGNOSIS — R262 Difficulty in walking, not elsewhere classified: Secondary | ICD-10-CM | POA: Diagnosis not present

## 2023-08-13 DIAGNOSIS — Z8673 Personal history of transient ischemic attack (TIA), and cerebral infarction without residual deficits: Secondary | ICD-10-CM | POA: Diagnosis not present

## 2023-08-13 DIAGNOSIS — R42 Dizziness and giddiness: Secondary | ICD-10-CM | POA: Diagnosis not present

## 2023-11-01 DIAGNOSIS — D329 Benign neoplasm of meninges, unspecified: Secondary | ICD-10-CM | POA: Diagnosis not present

## 2023-11-01 DIAGNOSIS — F32A Depression, unspecified: Secondary | ICD-10-CM | POA: Diagnosis not present

## 2023-11-01 DIAGNOSIS — F419 Anxiety disorder, unspecified: Secondary | ICD-10-CM | POA: Diagnosis not present

## 2023-11-01 DIAGNOSIS — I10 Essential (primary) hypertension: Secondary | ICD-10-CM | POA: Diagnosis not present

## 2023-11-01 DIAGNOSIS — M810 Age-related osteoporosis without current pathological fracture: Secondary | ICD-10-CM | POA: Diagnosis not present

## 2023-11-01 DIAGNOSIS — E7800 Pure hypercholesterolemia, unspecified: Secondary | ICD-10-CM | POA: Diagnosis not present

## 2023-11-02 DIAGNOSIS — M25562 Pain in left knee: Secondary | ICD-10-CM | POA: Diagnosis not present

## 2023-11-02 DIAGNOSIS — R8290 Unspecified abnormal findings in urine: Secondary | ICD-10-CM | POA: Diagnosis not present

## 2023-11-16 DIAGNOSIS — M810 Age-related osteoporosis without current pathological fracture: Secondary | ICD-10-CM | POA: Diagnosis not present

## 2023-12-09 DIAGNOSIS — R5383 Other fatigue: Secondary | ICD-10-CM | POA: Diagnosis not present

## 2023-12-09 DIAGNOSIS — R001 Bradycardia, unspecified: Secondary | ICD-10-CM | POA: Diagnosis not present

## 2023-12-09 DIAGNOSIS — E782 Mixed hyperlipidemia: Secondary | ICD-10-CM | POA: Diagnosis not present

## 2023-12-09 DIAGNOSIS — R0609 Other forms of dyspnea: Secondary | ICD-10-CM | POA: Diagnosis not present

## 2023-12-09 DIAGNOSIS — I340 Nonrheumatic mitral (valve) insufficiency: Secondary | ICD-10-CM | POA: Diagnosis not present

## 2023-12-09 DIAGNOSIS — I63522 Cerebral infarction due to unspecified occlusion or stenosis of left anterior cerebral artery: Secondary | ICD-10-CM | POA: Diagnosis not present

## 2023-12-09 DIAGNOSIS — I10 Essential (primary) hypertension: Secondary | ICD-10-CM | POA: Diagnosis not present

## 2023-12-22 DIAGNOSIS — I340 Nonrheumatic mitral (valve) insufficiency: Secondary | ICD-10-CM | POA: Diagnosis not present

## 2023-12-22 DIAGNOSIS — R0609 Other forms of dyspnea: Secondary | ICD-10-CM | POA: Diagnosis not present

## 2024-01-03 DIAGNOSIS — R0609 Other forms of dyspnea: Secondary | ICD-10-CM | POA: Diagnosis not present

## 2024-01-03 DIAGNOSIS — Z8673 Personal history of transient ischemic attack (TIA), and cerebral infarction without residual deficits: Secondary | ICD-10-CM | POA: Diagnosis not present

## 2024-01-03 DIAGNOSIS — I340 Nonrheumatic mitral (valve) insufficiency: Secondary | ICD-10-CM | POA: Diagnosis not present

## 2024-01-03 DIAGNOSIS — I10 Essential (primary) hypertension: Secondary | ICD-10-CM | POA: Diagnosis not present

## 2024-01-03 DIAGNOSIS — E782 Mixed hyperlipidemia: Secondary | ICD-10-CM | POA: Diagnosis not present

## 2024-01-04 ENCOUNTER — Other Ambulatory Visit: Payer: Self-pay

## 2024-01-04 ENCOUNTER — Inpatient Hospital Stay
Admission: EM | Admit: 2024-01-04 | Discharge: 2024-01-08 | DRG: 065 | Disposition: A | Discharge status: Home Health | Attending: Internal Medicine | Admitting: Internal Medicine

## 2024-01-04 DIAGNOSIS — G8929 Other chronic pain: Secondary | ICD-10-CM | POA: Diagnosis present

## 2024-01-04 DIAGNOSIS — I10 Essential (primary) hypertension: Secondary | ICD-10-CM | POA: Diagnosis present

## 2024-01-04 DIAGNOSIS — Z9842 Cataract extraction status, left eye: Secondary | ICD-10-CM

## 2024-01-04 DIAGNOSIS — Z8711 Personal history of peptic ulcer disease: Secondary | ICD-10-CM

## 2024-01-04 DIAGNOSIS — I110 Hypertensive heart disease with heart failure: Secondary | ICD-10-CM | POA: Diagnosis present

## 2024-01-04 DIAGNOSIS — R42 Dizziness and giddiness: Secondary | ICD-10-CM | POA: Diagnosis not present

## 2024-01-04 DIAGNOSIS — I447 Left bundle-branch block, unspecified: Secondary | ICD-10-CM | POA: Diagnosis not present

## 2024-01-04 DIAGNOSIS — Z888 Allergy status to other drugs, medicaments and biological substances status: Secondary | ICD-10-CM

## 2024-01-04 DIAGNOSIS — R531 Weakness: Secondary | ICD-10-CM | POA: Diagnosis present

## 2024-01-04 DIAGNOSIS — S199XXA Unspecified injury of neck, initial encounter: Secondary | ICD-10-CM | POA: Diagnosis not present

## 2024-01-04 DIAGNOSIS — S299XXA Unspecified injury of thorax, initial encounter: Secondary | ICD-10-CM | POA: Diagnosis not present

## 2024-01-04 DIAGNOSIS — R471 Dysarthria and anarthria: Secondary | ICD-10-CM | POA: Diagnosis present

## 2024-01-04 DIAGNOSIS — N390 Urinary tract infection, site not specified: Secondary | ICD-10-CM | POA: Diagnosis present

## 2024-01-04 DIAGNOSIS — I6389 Other cerebral infarction: Secondary | ICD-10-CM | POA: Diagnosis not present

## 2024-01-04 DIAGNOSIS — R55 Syncope and collapse: Secondary | ICD-10-CM | POA: Diagnosis not present

## 2024-01-04 DIAGNOSIS — Z8673 Personal history of transient ischemic attack (TIA), and cerebral infarction without residual deficits: Secondary | ICD-10-CM

## 2024-01-04 DIAGNOSIS — Z66 Do not resuscitate: Secondary | ICD-10-CM | POA: Diagnosis present

## 2024-01-04 DIAGNOSIS — S0990XA Unspecified injury of head, initial encounter: Secondary | ICD-10-CM | POA: Diagnosis not present

## 2024-01-04 DIAGNOSIS — I5032 Chronic diastolic (congestive) heart failure: Secondary | ICD-10-CM | POA: Diagnosis present

## 2024-01-04 DIAGNOSIS — Z961 Presence of intraocular lens: Secondary | ICD-10-CM | POA: Diagnosis present

## 2024-01-04 DIAGNOSIS — M4856XA Collapsed vertebra, not elsewhere classified, lumbar region, initial encounter for fracture: Secondary | ICD-10-CM | POA: Diagnosis not present

## 2024-01-04 DIAGNOSIS — W19XXXA Unspecified fall, initial encounter: Secondary | ICD-10-CM | POA: Diagnosis not present

## 2024-01-04 DIAGNOSIS — M19011 Primary osteoarthritis, right shoulder: Secondary | ICD-10-CM | POA: Diagnosis not present

## 2024-01-04 DIAGNOSIS — I639 Cerebral infarction, unspecified: Secondary | ICD-10-CM | POA: Diagnosis not present

## 2024-01-04 DIAGNOSIS — D329 Benign neoplasm of meninges, unspecified: Secondary | ICD-10-CM | POA: Diagnosis present

## 2024-01-04 DIAGNOSIS — R32 Unspecified urinary incontinence: Secondary | ICD-10-CM | POA: Diagnosis present

## 2024-01-04 DIAGNOSIS — M47816 Spondylosis without myelopathy or radiculopathy, lumbar region: Secondary | ICD-10-CM | POA: Diagnosis not present

## 2024-01-04 DIAGNOSIS — Z7902 Long term (current) use of antithrombotics/antiplatelets: Secondary | ICD-10-CM

## 2024-01-04 DIAGNOSIS — E785 Hyperlipidemia, unspecified: Secondary | ICD-10-CM | POA: Diagnosis present

## 2024-01-04 DIAGNOSIS — M4854XA Collapsed vertebra, not elsewhere classified, thoracic region, initial encounter for fracture: Secondary | ICD-10-CM | POA: Diagnosis not present

## 2024-01-04 DIAGNOSIS — G459 Transient cerebral ischemic attack, unspecified: Secondary | ICD-10-CM | POA: Diagnosis present

## 2024-01-04 DIAGNOSIS — E6609 Other obesity due to excess calories: Secondary | ICD-10-CM

## 2024-01-04 DIAGNOSIS — Z79899 Other long term (current) drug therapy: Secondary | ICD-10-CM

## 2024-01-04 DIAGNOSIS — M549 Dorsalgia, unspecified: Secondary | ICD-10-CM | POA: Diagnosis not present

## 2024-01-04 DIAGNOSIS — G96191 Perineural cyst: Secondary | ICD-10-CM | POA: Diagnosis not present

## 2024-01-04 DIAGNOSIS — Z853 Personal history of malignant neoplasm of breast: Secondary | ICD-10-CM

## 2024-01-04 DIAGNOSIS — Z886 Allergy status to analgesic agent status: Secondary | ICD-10-CM

## 2024-01-04 DIAGNOSIS — H9190 Unspecified hearing loss, unspecified ear: Secondary | ICD-10-CM | POA: Diagnosis present

## 2024-01-04 DIAGNOSIS — I6782 Cerebral ischemia: Secondary | ICD-10-CM | POA: Diagnosis not present

## 2024-01-04 DIAGNOSIS — S22080A Wedge compression fracture of T11-T12 vertebra, initial encounter for closed fracture: Secondary | ICD-10-CM | POA: Diagnosis present

## 2024-01-04 DIAGNOSIS — K219 Gastro-esophageal reflux disease without esophagitis: Secondary | ICD-10-CM | POA: Diagnosis present

## 2024-01-04 DIAGNOSIS — Z9841 Cataract extraction status, right eye: Secondary | ICD-10-CM

## 2024-01-04 DIAGNOSIS — M47812 Spondylosis without myelopathy or radiculopathy, cervical region: Secondary | ICD-10-CM | POA: Diagnosis not present

## 2024-01-04 DIAGNOSIS — Z8572 Personal history of non-Hodgkin lymphomas: Secondary | ICD-10-CM

## 2024-01-04 DIAGNOSIS — R3989 Other symptoms and signs involving the genitourinary system: Secondary | ICD-10-CM | POA: Insufficient documentation

## 2024-01-04 DIAGNOSIS — H8110 Benign paroxysmal vertigo, unspecified ear: Secondary | ICD-10-CM | POA: Diagnosis present

## 2024-01-04 DIAGNOSIS — S2231XD Fracture of one rib, right side, subsequent encounter for fracture with routine healing: Secondary | ICD-10-CM | POA: Diagnosis not present

## 2024-01-04 DIAGNOSIS — Z880 Allergy status to penicillin: Secondary | ICD-10-CM

## 2024-01-04 DIAGNOSIS — Z9049 Acquired absence of other specified parts of digestive tract: Secondary | ICD-10-CM

## 2024-01-04 DIAGNOSIS — E66811 Obesity, class 1: Secondary | ICD-10-CM

## 2024-01-04 DIAGNOSIS — R29710 NIHSS score 10: Secondary | ICD-10-CM | POA: Diagnosis present

## 2024-01-04 DIAGNOSIS — Z9011 Acquired absence of right breast and nipple: Secondary | ICD-10-CM

## 2024-01-04 DIAGNOSIS — M47814 Spondylosis without myelopathy or radiculopathy, thoracic region: Secondary | ICD-10-CM | POA: Diagnosis not present

## 2024-01-04 DIAGNOSIS — I700 Atherosclerosis of aorta: Secondary | ICD-10-CM | POA: Diagnosis not present

## 2024-01-04 DIAGNOSIS — Z6830 Body mass index (BMI) 30.0-30.9, adult: Secondary | ICD-10-CM

## 2024-01-04 DIAGNOSIS — G9340 Encephalopathy, unspecified: Secondary | ICD-10-CM | POA: Diagnosis present

## 2024-01-04 DIAGNOSIS — M4802 Spinal stenosis, cervical region: Secondary | ICD-10-CM | POA: Diagnosis not present

## 2024-01-04 DIAGNOSIS — R569 Unspecified convulsions: Secondary | ICD-10-CM | POA: Diagnosis not present

## 2024-01-04 NOTE — ED Provider Notes (Signed)
 [facility] Provider Note    Event Date/Time   First MD Initiated Contact with Patient 01/04/24 2322     (approximate)   History   Fall   HPI  Lindsey Barnes is a 89 y.o. female with history of hypertension, hyperlipidemia, breast cancer status post partial mastectomy, and CVA who presents with back pain after a fall.  The patient states that she was feeling quite dizzy yesterday and has a history of vertigo.  She states is better today but she thinks she may have fallen because she was dizzy.  The patient is not sure what caused her to fall.  She believes that she fell around 5 or 6 AM as she states that she was in her pajamas.  However, she states that she was down on the ground for only about an hour before EMS came after she pressed her life alert.  I pointed out to the patient that it is now almost midnight, so the fall would have been 18 hours ago.  The patient does not believe that she was on the ground for 18 hours, so she is unsure what happened and cannot explain the discrepancy.  She reports pain to her mid and upper back along both sides.  I reviewed the past medical records.  The patient was most recently admitted to the hospital service in 2022 with an acute stroke.   Physical Exam   Triage Vital Signs: ED Triage Vitals [01/04/24 2324]  Encounter Vitals Group     BP      Systolic BP Percentile      Diastolic BP Percentile      Pulse      Resp      Temp      Temp src      SpO2 93 %     Weight      Height      Head Circumference      Peak Flow      Pain Score      Pain Loc      Pain Education      Exclude from Growth Chart     Most recent vital signs: Vitals:   01/05/24 0213 01/05/24 0214  BP:  107/60  Pulse:  62  Resp:  18  Temp: 97.7 F (36.5 C)   SpO2:  94%     General: Alert and oriented, no distress.  CV:  Good peripheral perfusion.  Resp:  Normal effort.  Abd:  No distention.  Other:  EOMI.  PERRLA.  No  photophobia.  No facial droop.  Normal speech.  Motor intact in all extremities.  Midline thoracic spinal tenderness with no step-off or crepitus.  Bilateral paraspinal and posterior rib tenderness.  A few faint abrasions to the upper back with no significant ecchymosis.  Small area of ecchymosis to the left distal lower leg.  Full range of motion of bilateral hips and knees.    ED Results / Procedures / Treatments   Labs (all labs ordered are listed, but only abnormal results are displayed) Labs Reviewed  BASIC METABOLIC PANEL - Abnormal; Notable for the following components:      Result Value   Glucose, Bld 112 (*)    Calcium 8.1 (*)    All other components within normal limits  CBC WITH DIFFERENTIAL/PLATELET - Abnormal; Notable for the following components:   Platelets 149 (*)    All other components within normal limits  URINALYSIS, ROUTINE W REFLEX  MICROSCOPIC - Abnormal; Notable for the following components:   Color, Urine YELLOW (*)    APPearance HAZY (*)    Leukocytes,Ua SMALL (*)    Bacteria, UA RARE (*)    All other components within normal limits  CK  TROPONIN I (HIGH SENSITIVITY)     EKG  ED ECG REPORT I, Dionne Bucy, the attending physician, personally viewed and interpreted this ECG.  Date: 01/04/2024 EKG Time: 2347 Rate: 68 Rhythm: normal sinus rhythm QRS Axis: normal Intervals: Incomplete LBBB ST/T Wave abnormalities: LVH with repolarization abnormality Narrative Interpretation: no evidence of acute ischemia    RADIOLOGY  CT head: I independently viewed and interpreted the images; there is no ICH.  Radiology report indicates no acute abnormality.  CT cervical spine: No acute fracture  CT thoracic spine: No acute fracture  CT lumbar spine: No acute fracture  XR ribs bilateral: No acute fracture  PROCEDURES:  Critical Care performed: No  Procedures   MEDICATIONS ORDERED IN ED: Medications  cefTRIAXone (ROCEPHIN) 1 g in sodium  chloride 0.9 % 100 mL IVPB (1 g Intravenous New Bag/Given 01/05/24 0203)  acetaminophen (TYLENOL) tablet 650 mg (650 mg Oral Given 01/05/24 0204)     IMPRESSION / MDM / ASSESSMENT AND PLAN / ED COURSE  I reviewed the triage vital signs and the nursing notes.  89 year old female with PMH as noted above presents after a fall; she believes that it happened around 5 or 6 AM but does not remember being on the ground for that long, so the time course is unclear.  The cause of the fall is unclear based on her history.  On exam her vital signs are normal.  She has thoracic midline and paraspinal tenderness as well as some mild bilateral posterior rib tenderness.  Neurologic exam is nonfocal.  Differential diagnosis includes, but is not limited to, mechanical fall, near syncope, syncope.  Differential for the head injury includes minor head injury, concussion, ICH.  Differential for the back pain includes contusion, spinal fracture, rib fracture.  We will obtain CT head, CTs of the spine, bilateral rib x-rays, as well as basic labs, troponin, UA.  Patient's presentation is most consistent with acute presentation with potential threat to life or bodily function.  The patient is on the cardiac monitor to evaluate for evidence of arrhythmia and/or significant heart rate changes.  ----------------------------------------- 2:13 AM on 01/05/2024 -----------------------------------------  Imaging is negative for acute traumatic findings.  Lab workup is overall unremarkable.  Troponin is negative.  CK is normal.  BMP and CBC show no acute findings.  Patient's family members are now here and clarified that they believe she fell a few hours prior to coming to the ED, and was confused about the timing.  They feel that she has been weak over the last few days and are concerned that she was dizzy enough that she likely syncopized and fell.  Urinalysis shows findings consistent with possible UTI.  I have ordered IV  ceftriaxone for empiric treatment.  Based on shared decision making with the patient and family, given the likely syncope and weakness without a clear cause we will admit for observation.  I consulted Dr. Arville Care from the hospitalist service; based on our discussion he agrees to evaluate the patient for admission.   FINAL CLINICAL IMPRESSION(S) / ED DIAGNOSES   Final diagnoses:  Fall, initial encounter  Syncope, unspecified syncope type     Rx / DC Orders   ED Discharge Orders  None        Note:  This document was prepared using Dragon voice recognition software and may include unintentional dictation errors.    Dionne Bucy, MD 01/05/24 [phone]

## 2024-01-04 NOTE — ED Triage Notes (Signed)
 Pt BIB [facility] from home d/t Fall in restroom.  Pt is on Plavix but unsure if had LOC or hit head.  No obvious injuries noted on head by EMS.   Pt has vertigo and was dizzy this morning and believes that is why she fell.  Pt does complain of Right back pain and does have bruising on right scapula.

## 2024-01-05 ENCOUNTER — Observation Stay

## 2024-01-05 ENCOUNTER — Emergency Department

## 2024-01-05 ENCOUNTER — Observation Stay (HOSPITAL_BASED_OUTPATIENT_CLINIC_OR_DEPARTMENT_OTHER)
Admit: 2024-01-05 | Discharge: 2024-01-05 | Disposition: A | Discharge status: Home / Self Care | Attending: Family Medicine | Admitting: Family Medicine

## 2024-01-05 ENCOUNTER — Encounter: Payer: Self-pay | Admitting: Family Medicine

## 2024-01-05 DIAGNOSIS — I10 Essential (primary) hypertension: Secondary | ICD-10-CM | POA: Diagnosis not present

## 2024-01-05 DIAGNOSIS — M47814 Spondylosis without myelopathy or radiculopathy, thoracic region: Secondary | ICD-10-CM | POA: Diagnosis not present

## 2024-01-05 DIAGNOSIS — G8929 Other chronic pain: Secondary | ICD-10-CM | POA: Diagnosis not present

## 2024-01-05 DIAGNOSIS — I700 Atherosclerosis of aorta: Secondary | ICD-10-CM | POA: Diagnosis not present

## 2024-01-05 DIAGNOSIS — S2231XD Fracture of one rib, right side, subsequent encounter for fracture with routine healing: Secondary | ICD-10-CM | POA: Diagnosis not present

## 2024-01-05 DIAGNOSIS — R55 Syncope and collapse: Secondary | ICD-10-CM

## 2024-01-05 DIAGNOSIS — R42 Dizziness and giddiness: Secondary | ICD-10-CM | POA: Diagnosis not present

## 2024-01-05 DIAGNOSIS — E785 Hyperlipidemia, unspecified: Secondary | ICD-10-CM

## 2024-01-05 DIAGNOSIS — M47812 Spondylosis without myelopathy or radiculopathy, cervical region: Secondary | ICD-10-CM | POA: Diagnosis not present

## 2024-01-05 DIAGNOSIS — R220 Localized swelling, mass and lump, head: Secondary | ICD-10-CM | POA: Diagnosis not present

## 2024-01-05 DIAGNOSIS — G96191 Perineural cyst: Secondary | ICD-10-CM | POA: Diagnosis not present

## 2024-01-05 DIAGNOSIS — W19XXXA Unspecified fall, initial encounter: Secondary | ICD-10-CM | POA: Diagnosis not present

## 2024-01-05 DIAGNOSIS — S22080G Wedge compression fracture of T11-T12 vertebra, subsequent encounter for fracture with delayed healing: Secondary | ICD-10-CM | POA: Diagnosis not present

## 2024-01-05 DIAGNOSIS — S0990XA Unspecified injury of head, initial encounter: Secondary | ICD-10-CM | POA: Diagnosis not present

## 2024-01-05 DIAGNOSIS — M549 Dorsalgia, unspecified: Secondary | ICD-10-CM

## 2024-01-05 DIAGNOSIS — M4802 Spinal stenosis, cervical region: Secondary | ICD-10-CM | POA: Diagnosis not present

## 2024-01-05 DIAGNOSIS — N390 Urinary tract infection, site not specified: Secondary | ICD-10-CM | POA: Diagnosis not present

## 2024-01-05 DIAGNOSIS — M4854XA Collapsed vertebra, not elsewhere classified, thoracic region, initial encounter for fracture: Secondary | ICD-10-CM | POA: Diagnosis not present

## 2024-01-05 DIAGNOSIS — Y92009 Unspecified place in unspecified non-institutional (private) residence as the place of occurrence of the external cause: Secondary | ICD-10-CM

## 2024-01-05 DIAGNOSIS — I63522 Cerebral infarction due to unspecified occlusion or stenosis of left anterior cerebral artery: Secondary | ICD-10-CM | POA: Diagnosis not present

## 2024-01-05 DIAGNOSIS — M47816 Spondylosis without myelopathy or radiculopathy, lumbar region: Secondary | ICD-10-CM | POA: Diagnosis not present

## 2024-01-05 DIAGNOSIS — S299XXA Unspecified injury of thorax, initial encounter: Secondary | ICD-10-CM | POA: Diagnosis not present

## 2024-01-05 DIAGNOSIS — Z8673 Personal history of transient ischemic attack (TIA), and cerebral infarction without residual deficits: Secondary | ICD-10-CM

## 2024-01-05 DIAGNOSIS — R3989 Other symptoms and signs involving the genitourinary system: Secondary | ICD-10-CM | POA: Insufficient documentation

## 2024-01-05 DIAGNOSIS — I6782 Cerebral ischemia: Secondary | ICD-10-CM | POA: Diagnosis not present

## 2024-01-05 DIAGNOSIS — S199XXA Unspecified injury of neck, initial encounter: Secondary | ICD-10-CM | POA: Diagnosis not present

## 2024-01-05 DIAGNOSIS — M19011 Primary osteoarthritis, right shoulder: Secondary | ICD-10-CM | POA: Diagnosis not present

## 2024-01-05 DIAGNOSIS — M4856XA Collapsed vertebra, not elsewhere classified, lumbar region, initial encounter for fracture: Secondary | ICD-10-CM | POA: Diagnosis not present

## 2024-01-05 LAB — ECHOCARDIOGRAM COMPLETE
AR max vel: 2.03 cm2
AV Area VTI: 1.88 cm2
AV Area mean vel: 1.72 cm2
AV Mean grad: 7 mmHg
AV Peak grad: 13.4 mmHg
Ao pk vel: 1.83 m/s
Area-P 1/2: 2.47 cm2
Height: 67 in
MV VTI: 2.34 cm2
S' Lateral: 2.9 cm
Weight: 3056 [oz_av]

## 2024-01-05 LAB — CK: Total CK: 39 U/L (ref 38–234)

## 2024-01-05 LAB — URINALYSIS, ROUTINE W REFLEX MICROSCOPIC
Bilirubin Urine: NEGATIVE
Glucose, UA: NEGATIVE mg/dL
Hgb urine dipstick: NEGATIVE
Ketones, ur: NEGATIVE mg/dL
Nitrite: NEGATIVE
Protein, ur: NEGATIVE mg/dL
Specific Gravity, Urine: 1.019 (ref 1.005–1.030)
pH: 6 (ref 5.0–8.0)

## 2024-01-05 LAB — GLUCOSE, CAPILLARY: Glucose-Capillary: 104 mg/dL — ABNORMAL HIGH (ref 70–99)

## 2024-01-05 LAB — CBC WITH DIFFERENTIAL/PLATELET
Abs Immature Granulocytes: 0.04 10*3/uL (ref 0.00–0.07)
Basophils Absolute: 0 10*3/uL (ref 0.0–0.1)
Basophils Relative: 0 %
Eosinophils Absolute: 0.1 10*3/uL (ref 0.0–0.5)
Eosinophils Relative: 1 %
HCT: 39.3 % (ref 36.0–46.0)
Hemoglobin: 13 g/dL (ref 12.0–15.0)
Immature Granulocytes: 1 %
Lymphocytes Relative: 13 %
Lymphs Abs: 0.9 10*3/uL (ref 0.7–4.0)
MCH: 31.3 pg (ref 26.0–34.0)
MCHC: 33.1 g/dL (ref 30.0–36.0)
MCV: 94.5 fL (ref 80.0–100.0)
Monocytes Absolute: 0.6 10*3/uL (ref 0.1–1.0)
Monocytes Relative: 9 %
Neutro Abs: 5.1 10*3/uL (ref 1.7–7.7)
Neutrophils Relative %: 76 %
Platelets: 149 10*3/uL — ABNORMAL LOW (ref 150–400)
RBC: 4.16 MIL/uL (ref 3.87–5.11)
RDW: 13.6 % (ref 11.5–15.5)
WBC: 6.7 10*3/uL (ref 4.0–10.5)
nRBC: 0 % (ref 0.0–0.2)

## 2024-01-05 LAB — TROPONIN I (HIGH SENSITIVITY): Troponin I (High Sensitivity): 6 ng/L (ref <18)

## 2024-01-05 LAB — BASIC METABOLIC PANEL
Anion gap: 5 (ref 5–15)
BUN: 20 mg/dL (ref 8–23)
CO2: 25 mmol/L (ref 22–32)
Calcium: 8.1 mg/dL — ABNORMAL LOW (ref 8.9–10.3)
Chloride: 108 mmol/L (ref 98–111)
Creatinine, Ser: 0.82 mg/dL (ref 0.44–1.00)
GFR, Estimated: >60 mL/min (ref >60)
Glucose, Bld: 112 mg/dL — ABNORMAL HIGH (ref 70–99)
Potassium: 4.4 mmol/L (ref 3.5–5.1)
Sodium: 138 mmol/L (ref 135–145)

## 2024-01-05 MED ORDER — SODIUM CHLORIDE 0.9 % IV SOLN
1.0000 g | Freq: Once | INTRAVENOUS | Status: AC
  Administered 2024-01-05: 1 g via INTRAVENOUS
  Filled 2024-01-05: qty 10

## 2024-01-05 MED ORDER — ACETAMINOPHEN 325 MG PO TABS
650.0000 mg | ORAL_TABLET | Freq: Four times a day (QID) | ORAL | Status: DC | PRN
  Administered 2024-01-05 – 2024-01-06 (×3): 650 mg via ORAL
  Filled 2024-01-05 (×3): qty 2

## 2024-01-05 MED ORDER — MECLIZINE HCL 25 MG PO TABS: 25.0000 mg | ORAL_TABLET | Freq: Three times a day (TID) | ORAL | Status: DC | PRN

## 2024-01-05 MED ORDER — ACETAMINOPHEN 325 MG PO TABS
650.0000 mg | ORAL_TABLET | Freq: Once | ORAL | Status: AC
  Administered 2024-01-05: 650 mg via ORAL
  Filled 2024-01-05: qty 2

## 2024-01-05 MED ORDER — ENOXAPARIN SODIUM 40 MG/0.4ML IJ SOSY
40.0000 mg | PREFILLED_SYRINGE | INTRAMUSCULAR | Status: DC
Start: 2024-01-05 — End: 2024-01-08
  Administered 2024-01-05 – 2024-01-08 (×4): 40 mg via SUBCUTANEOUS
  Filled 2024-01-05 (×4): qty 0.4

## 2024-01-05 MED ORDER — CLOPIDOGREL BISULFATE 75 MG PO TABS
75.0000 mg | ORAL_TABLET | Freq: Every day | ORAL | Status: DC
Start: 2024-01-05 — End: 2024-01-08
  Administered 2024-01-05 – 2024-01-08 (×4): 75 mg via ORAL
  Filled 2024-01-05 (×3): qty 1

## 2024-01-05 MED ORDER — SODIUM CHLORIDE 0.9% FLUSH
3.0000 mL | Freq: Two times a day (BID) | INTRAVENOUS | Status: DC
  Administered 2024-01-05 – 2024-01-08 (×7): 3 mL via INTRAVENOUS

## 2024-01-05 MED ORDER — STROKE: EARLY STAGES OF RECOVERY BOOK: Freq: Once | Status: AC

## 2024-01-05 MED ORDER — HYDROCODONE-ACETAMINOPHEN 5-325 MG PO TABS
1.0000 | ORAL_TABLET | Freq: Three times a day (TID) | ORAL | Status: DC | PRN
  Administered 2024-01-05 – 2024-01-07 (×3): 1 via ORAL
  Filled 2024-01-05 (×3): qty 1

## 2024-01-05 MED ORDER — HYDROCODONE-ACETAMINOPHEN 5-325 MG PO TABS: 1.0000 | ORAL_TABLET | Freq: Four times a day (QID) | ORAL | Status: DC | PRN

## 2024-01-05 MED ORDER — LORAZEPAM 0.5 MG PO TABS
0.5000 mg | ORAL_TABLET | ORAL | Status: AC
  Administered 2024-01-05: 0.5 mg via ORAL
  Filled 2024-01-05: qty 1

## 2024-01-05 MED ORDER — SODIUM CHLORIDE 0.9 % IV SOLN: INTRAVENOUS | Status: AC

## 2024-01-05 MED ORDER — OLANZAPINE 10 MG IM SOLR
2.5000 mg | Freq: Once | INTRAMUSCULAR | Status: AC | PRN
  Administered 2024-01-05: 2.5 mg via INTRAMUSCULAR
  Filled 2024-01-05: qty 10

## 2024-01-05 MED ORDER — SIMVASTATIN 20 MG PO TABS
40.0000 mg | ORAL_TABLET | Freq: Every evening | ORAL | Status: DC
  Administered 2024-01-06: 40 mg via ORAL
  Filled 2024-01-05 (×2): qty 2

## 2024-01-05 MED ORDER — HYDROMORPHONE HCL 1 MG/ML IJ SOLN
0.5000 mg | INTRAMUSCULAR | Status: DC | PRN
  Administered 2024-01-05: 0.5 mg via INTRAVENOUS
  Filled 2024-01-05: qty 0.5

## 2024-01-05 MED ORDER — SODIUM CHLORIDE 0.9 % IV BOLUS
500.0000 mL | Freq: Once | INTRAVENOUS | Status: AC
  Administered 2024-01-05: 500 mL via INTRAVENOUS

## 2024-01-05 MED ORDER — SODIUM CHLORIDE 0.9 % IV SOLN
1.0000 g | INTRAVENOUS | Status: DC
  Administered 2024-01-06: 1 g via INTRAVENOUS
  Filled 2024-01-05: qty 10

## 2024-01-05 NOTE — Progress Notes (Signed)
*  PRELIMINARY RESULTS* Echocardiogram 2D Echocardiogram has been performed.  Lindsey Barnes 01/05/2024, 2:42 PM

## 2024-01-05 NOTE — ED Notes (Addendum)
 Pt resting peacefully in bed at this time. Pt ABCs intact. RR even and unlabored. Pt in NAD. Bed in lowest locked position. Call bell in reach.

## 2024-01-05 NOTE — ED Notes (Signed)
 RN observed pts BP being low and Inpatient provider Mansy, MD made aware. RN rechecked BP after repositioning cuff and found pt to still be hypotensive. Pt denies feeling weak or light headed. Pt able to use bedside commode with minimal assistance and tucked back into bed.

## 2024-01-05 NOTE — ED Notes (Signed)
 Pt reporting to ED d/t fall with pain to the R back/shoulder area. Pt on Plavix and doesn't remember if she hit her head or had a LOC. Pt at this time is waiting to be taken upstairs for admission. Pt ambulatory with walker. Pt ABCs intact. RR even and unlabored. Pt in NAD. Pt does have hearing loss, but is able to hear with louder speech. Pt connected to monitors. Bed in lowest locked position. Call bell in reach. Denies needs at this time.

## 2024-01-05 NOTE — Assessment & Plan Note (Signed)
 Noted remote history of TIA in the past Check MRI of the brain x 1 in the setting of syncopal event Nonfocal neuroexam Monitor

## 2024-01-05 NOTE — Progress Notes (Signed)
 Patient arrived to unit in wheelchair with daughter at side.  Patient transferred to bed with contact guard assist, c/o neck pain 5/10 d/t recent fall-pain medication requested from Dr Alvester Morin.  Patient alert, oriented x4.

## 2024-01-05 NOTE — ED Notes (Signed)
 Patient transported to MRI

## 2024-01-05 NOTE — Assessment & Plan Note (Signed)
 Urinalysis indicative of infection in setting of noted syncopal event  Empirically covered w/ IV rocephin in the ER  Cont rocephin for now  Urine culture  Monitor

## 2024-01-05 NOTE — Progress Notes (Signed)
 MRI showing: Punctate focus of acute infarct involving the cortex of the posterior left frontal lobe  Dr. Selina Cooley w/ neurology notified  Cont plavix  Stroke protocol ordered  Follow up neurology recommendations in am

## 2024-01-05 NOTE — Progress Notes (Addendum)
 Throughout afternoon, patient's level of agitation increase and oriented fluctuating orientation.  Daughter at bedside, reports patient attempting to exit bed, pulls at IV tubing and is restless.  Dr Alvester Morin notified and inquired on initiating NIH due to change in mental status and MRI result change.   NIH completed with score of 10 with left side weakness, ataxia and dysarthria-Dr Alvester Morin notified. Bedside swallow screen completed with patient able to consume 3oz of water with one rest break required.

## 2024-01-05 NOTE — Assessment & Plan Note (Signed)
 Noted T12 compression fracture on imaging-chronic versus acute on chronic issue Minimal back pain at present Consult neurosurgery as appropriate Pain control PT OT evaluation Monitor

## 2024-01-05 NOTE — ED Notes (Signed)
 Therapy in room at this time. Will transport to floor after therapy completes evaluation

## 2024-01-05 NOTE — ED Notes (Signed)
 Pt assisted to bedside commode with help of secondary RN. Pt ABCs intact. RR even and unlabored. Pt in NAD. Bed in lowest locked position.

## 2024-01-05 NOTE — ED Notes (Signed)
 Pt helped up to beside commode with one assist

## 2024-01-05 NOTE — Progress Notes (Signed)
 PT Cancellation Note  Patient Details Name: Lindsey Barnes MRN: [id number] DOB: Mar 09, 1933   Cancelled Treatment:    Reason Eval/Treat Not Completed: Other (comment) Chart reviewed, case discussed with RN.  On arrival pt confusedly swinging legs off bed and struggling to follow daughter's cues to calm and stay in bed.  The daughter, who checks in on pt daily, says "I've never seen her like this, she's not herself and I don't think we should try getting up or anything today."  Requests to hold PT at this time.  Will maintain on caseload and try back at a later date.  Malachi Pro, DPT 01/05/2024, 4:51 PM

## 2024-01-05 NOTE — Evaluation (Signed)
 Occupational Therapy Evaluation Patient Details Name: Lindsey Barnes MRN: [id number] DOB: [birth year] Today's Date: 01/05/2024   History of Present Illness   89 y.o. female with medical history significant of TIA, chronic back pain, hypertension, GERD, hard of hearing presenting with fall, syncopal event.  Patient reports syncopal event in the washroom.  Patient reports mild generalized dizziness prior to event.  Some reports of vertigo although patient currently denies.  No chest pain or shortness of breath.  Episode happened around 5 or 6 AM today.  Denies any chest pain prior to event.  No abdominal pain.  No nausea or vomiting.  Patient was unable to get up from the floor.  Life alert button was pressed.  No reported dysuria or increased urinary frequency.     Clinical Impressions Patient presenting with decreased Ind in self care,balance, functional mobility/transfers, endurance, and safety awareness. Patient reports living at home alone with use of [facility] for mobility. Pt has life alert. Her daughter calls and comes by each day to assist with IADLs as needed. Pt has back brace from prior t12 fx. Patient currently functioning at min A for bed mobility with log roll and stand from EOB. Pt transfers with RW and CGA into transport chair to go to new room. Patient will benefit from acute OT to increase overall independence in the areas of ADLs, functional mobility, and safety awareness in order to safely discharge.     If plan is discharge home, recommend the following:   A little help with walking and/or transfers;A little help with bathing/dressing/bathroom;Assistance with cooking/housework;Assist for transportation;Help with stairs or ramp for entrance     Functional Status Assessment   Patient has had a recent decline in their functional status and demonstrates the ability to make significant improvements in function in a reasonable and predictable amount of time.     Equipment  Recommendations   None recommended by OT      Precautions/Restrictions   Precautions Precautions: Fall Restrictions Weight Bearing Restrictions Per Provider Order: No Other Position/Activity Restrictions: Pt has back brace from prior surgery she wears daily for comfort. T12 compression fx from several years has not changed on imaging after fall.     Mobility Bed Mobility Overal bed mobility: Needs Assistance Bed Mobility: Supine to Sit     Supine to sit: Min assist     General bed mobility comments: min A for log roll    Transfers Overall transfer level: Needs assistance Equipment used: 1 person hand held assist Transfers: Sit to/from Stand, Bed to chair/wheelchair/BSC Sit to Stand: Contact guard assist     Step pivot transfers: Contact guard assist            Balance Overall balance assessment: Needs assistance Sitting-balance support: Feet supported, Bilateral upper extremity supported Sitting balance-Leahy Scale: Good     Standing balance support: Reliant on assistive device for balance, During functional activity, Bilateral upper extremity supported Standing balance-Leahy Scale: Fair                             ADL either performed or assessed with clinical judgement   ADL Overall ADL's : Needs assistance/impaired     Grooming: Wash/dry hands;Wash/dry face;Sitting;Set up;Supervision/safety                   Toilet Transfer: Contact guard assist;Rolling walker (2 wheels) Toilet Transfer Details (indicate cue type and reason): min guard  Vision Patient Visual Report: No change from baseline              Pertinent Vitals/Pain Pain Assessment Pain Assessment: Faces Faces Pain Scale: Hurts little more Pain Location: low back Pain Descriptors / Indicators: Aching, Discomfort Pain Intervention(s): Limited activity within patient's tolerance, Repositioned, Monitored during session     Extremity/Trunk  Assessment Upper Extremity Assessment Upper Extremity Assessment: Generalized weakness   Lower Extremity Assessment Lower Extremity Assessment: Generalized weakness       Communication Communication Communication: Impaired Factors Affecting Communication: Hearing impaired   Cognition Arousal: Alert Behavior During Therapy: WFL for tasks assessed/performed Cognition: No apparent impairments                               Following commands: Intact       Cueing  General Comments   Cueing Techniques: Verbal cues;Gestural cues;Tactile cues;Visual cues              Home Living Family/patient expects to be discharged to:: Private residence Living Arrangements: Alone Available Help at Discharge: Family;Available 24 hours/day Type of Home: House Home Access: Stairs to enter [employer] of Steps: 4-6 Entrance Stairs-Rails: Right Home Layout: One level     Bathroom Shower/Tub: Tub/shower unit         Home Equipment: Rollator (4 wheels);Cane - single point;Shower seat;Grab bars - toilet;Grab bars - tub/shower          Prior Functioning/Environment Prior Level of Function : Needs assist;Independent/Modified Independent               ADLs Comments: mod I for self care tasks with daughter assisting with IADLs    OT Problem List: Decreased strength;Decreased activity tolerance;Decreased safety awareness;Impaired balance (sitting and/or standing);Decreased knowledge of use of DME or AE   OT Treatment/Interventions: Self-care/ADL training;Therapeutic exercise;Therapeutic activities;Energy conservation;DME and/or AE instruction;Patient/family education;Balance training      OT Goals(Current goals can be found in the care plan section)   Acute Rehab OT Goals Patient Stated Goal: to go home OT Goal Formulation: With patient/family Time For Goal Achievement: 01/19/24 Potential to Achieve Goals: Fair ADL Goals Pt Will Perform Grooming:  with modified independence;standing Pt Will Perform Lower Body Dressing: with modified independence;sit to/from stand Pt Will Transfer to Toilet: with modified independence;ambulating Pt Will Perform Toileting - Clothing Manipulation and hygiene: with modified independence;sit to/from stand   OT Frequency:  Min 1X/week       AM-PAC OT "6 Clicks" Daily Activity     Outcome Measure Help from another person eating meals?: None Help from another person taking care of personal grooming?: None Help from another person toileting, which includes using toliet, bedpan, or urinal?: A Little Help from another person bathing (including washing, rinsing, drying)?: A Little Help from another person to put on and taking off regular upper body clothing?: None Help from another person to put on and taking off regular lower body clothing?: A Little 6 Click Score: 21   End of Session Equipment Utilized During Treatment: Rolling walker (2 wheels) Nurse Communication: Mobility status  Activity Tolerance: Patient tolerated treatment well Patient left: Other (comment) (transferred to transport chair)  OT Visit Diagnosis: Unsteadiness on feet (R26.81);Repeated falls (R29.6);Muscle weakness (generalized) (M62.81)                Time: 3875-6433 OT Time Calculation (min): 13 min Charges:  OT General Charges $OT Visit: 1 Visit OT Evaluation $OT Eval  Low Complexity: 1 Low  Jackquline Denmark, MS, OTR/L , CBIS ascom [phone]  01/05/24, 2:11 PM

## 2024-01-05 NOTE — Assessment & Plan Note (Addendum)
Continue statin pending CK level 

## 2024-01-05 NOTE — H&P (Addendum)
 History and Physical    Patient: Lindsey Barnes ZOX:[id number] DOB: [birth year] DOA: 01/04/2024 DOS: the patient was seen and examined on 01/05/2024 PCP: Dorothey Baseman, MD  Patient coming from: Home  Chief Complaint:  Chief Complaint  Patient presents with   Fall   HPI: Lindsey Barnes is a 89 y.o. female with medical history significant of TIA, chronic back pain, hypertension, GERD, hard of hearing presenting with fall, syncopal event.  Patient reports syncopal event in the washroom.  Patient reports mild generalized dizziness prior to event.  Some reports of vertigo although patient currently denies.  No chest pain or shortness of breath.  Episode happened around 5 or 6 AM today.  Denies any chest pain prior to event.  No abdominal pain.  No nausea or vomiting.  Patient was unable to get up from the floor.  Life alert button was pressed.  No reported dysuria or increased urinary frequency.  Positive mild back pain which is noted to be chronic. Presented to the ER afebrile, systolic pressures 80s to 120s over 50s to 60s.  Satting well on room air.  White count 6.7, hemoglobin 13, platelets 149, creatinine 0.82.  Troponin within normal limits.  Urinalysis leukocyte positive.  CT head and CT C-spine grossly stable.  CT thoracic spine showing T12 compression fracture noted from prior 2013 imaging?  Acute versus chronic.  Positive unchanged L2 compression fracture status post kyphoplasty.  EKG normal sinus rhythm. Review of Systems: As mentioned in the history of present illness. All other systems reviewed and are negative. Past Medical History:  Diagnosis Date   Anxiety    Breast cancer (HCC)    1990   Complication of anesthesia    Depression    Dyspnea    DOE   GERD (gastroesophageal reflux disease)    Headache(784.0)    HOH (hard of hearing)    Hypertension    Lymphoma (HCC)    Pain    CHRONIC BACK   Pain    CHRONIC BACK   Pneumonia    PONV (postoperative nausea and  vomiting)    Tremors of nervous system    HANDS OCCAS   Past Surgical History:  Procedure Laterality Date   APPENDECTOMY     BACK SURGERY     BREAST SURGERY     CATARACT EXTRACTION W/PHACO Left 01/26/2017   Procedure: CATARACT EXTRACTION PHACO AND INTRAOCULAR LENS PLACEMENT (IOC);  Surgeon: Galen Manila, MD;  Location: [facility] ORS;  Service: Ophthalmology;  Laterality: Left;  [place] 53.7 AP% 20.3 CDE 10.90 Fluid pack lot # [id number]   CATARACT EXTRACTION W/PHACO Right 02/16/2017   Procedure: CATARACT EXTRACTION PHACO AND INTRAOCULAR LENS PLACEMENT (IOC);  Surgeon: Galen Manila, MD;  Location: [facility] OR[facility];  Service: Ophthalmology;  Laterality: Right;  [place] 00:55 AP% 22.5 CDE 12.43 Fluid pack lot # [id number]   CESAREAN SECTION     CHOLECYSTECTOMY N/A 03/20/2018   Procedure: LAPAROSCOPIC CHOLECYSTECTOMY;  Surgeon: Carolan Shiver, MD;  Location: [facility] ORS;  Service: General;  Laterality: N/A;   MASTECTOMY Right 1992   TUBAL LIGATION     Social History:  reports that she has never smoked. She has never used smokeless tobacco. She reports current drug use. Drugs: Hydrocodone and Benzodiazepines. She reports that she does not drink alcohol.  Allergies  Allergen Reactions   Amoxicillin Swelling    Blisters.    Aspirin Other (See Comments)    Stomach ulcers.   Benadryl [Diphenhydramine Hcl] Other (See Comments)    Hyperactive.  History reviewed. No pertinent family history.  Prior to Admission medications   Medication Sig Start Date End Date Taking? Authorizing Provider  bisoprolol-hydrochlorothiazide (ZIAC) 2.5-6.25 MG per tablet Take 1 tablet by mouth daily. 5-6.25 tab    [provider]  Calcium Carbonate-Vitamin D3 600-400 MG-UNIT TABS Take 1 tablet by mouth 1 day or 1 dose.    [provider]  cholecalciferol (VITAMIN D) 1000 units tablet Take 1,000 Units by mouth daily.    [provider]  clopidogrel (PLAVIX) 75 MG tablet Take 1 tablet (75 mg total)  by mouth daily. 01/19/21   Marrion Coy, MD  diazepam (VALIUM) 2 MG tablet Take 2 mg by mouth every 6 (six) hours as needed for anxiety.    [provider]  docusate sodium (COLACE) 100 MG capsule Take 100 mg by mouth daily as needed for mild constipation or moderate constipation.    [provider]  HYDROcodone-acetaminophen (NORCO/VICODIN) 5-325 MG tablet Take 1-2 tablets by mouth every 4 (four) hours as needed for moderate pain. 03/22/18   Pabon, Diego F, MD  HYDROcodone-acetaminophen (NORCO/VICODIN) 5-325 MG tablet Take 1 tablet by mouth every 6 (six) hours as needed for moderate pain. 03/22/18   Pabon, Diego F, MD  KLOR-CON M20 20 MEQ tablet Take 20 mEq by mouth in the morning and at bedtime. 01/03/21   [provider]  meclizine (ANTIVERT) 25 MG tablet Take 25 mg by mouth 3 (three) times daily as needed. For dizziness    [provider]  propranolol (INDERAL) 20 MG tablet Take 20 mg by mouth 2 (two) times daily. 11/18/20   [provider]  ranitidine (ZANTAC) 150 MG tablet Take 150 mg by mouth 2 (two) times daily.    [provider]  scopolamine (TRANSDERM-SCOP) 1 MG/3DAYS Place 1 patch onto the skin every 3 (three) days. As needed    [provider]  sertraline (ZOLOFT) 100 MG tablet Take 50 mg by mouth 2 (two) times daily.     [provider]  simvastatin (ZOCOR) 40 MG tablet Take 40 mg by mouth every evening.    [provider]  traZODone (DESYREL) 50 MG tablet Take 50 mg by mouth at bedtime.    [provider]  vitamin B-12 (CYANOCOBALAMIN) 500 MCG tablet Take 500 mcg by mouth daily.    [provider]    Physical Exam: Vitals:   01/05/24 0630 01/05/24 0715 01/05/24 0728 01/05/24 0800  BP: (!) 112/59 (!) 123/57  134/63  Pulse: 69 62  66  Resp:  16  (!) 23  Temp:   97.9 F (36.6 C)   TempSrc:   Oral   SpO2: 94% 94%  94%  Weight:      Height:       Physical Exam Constitutional:       Appearance: She is normal weight.  HENT:     Head: Normocephalic and atraumatic.     Nose: Nose normal.     Mouth/Throat:     Mouth: Mucous membranes are moist.  Eyes:     Pupils: Pupils are equal, round, and reactive to light.  Cardiovascular:     Rate and Rhythm: Normal rate and regular rhythm.  Pulmonary:     Effort: Pulmonary effort is normal.  Abdominal:     General: Bowel sounds are normal.  Musculoskeletal:        General: Normal range of motion.  Skin:    General: Skin is warm and dry.  Neurological:  General: No focal deficit present.  Psychiatric:        Mood and Affect: Mood normal.     Data Reviewed:  There are no new results to review at this time.  CT Thoracic Spine Wo Contrast Addendum: ADDENDUM REPORT: 01/05/2024 00:57   ADDENDUM:  Correlation with prior lumbar spine radiographs from 01/17/2021 show  no change to the T12 compression fracture. No acute compression is  indicated.   Electronically Signed    By: Burman Nieves M.D.    On: 01/05/2024 00:57 Narrative: CLINICAL DATA:  Back trauma after a fall.  EXAM: CT THORACIC SPINE WITHOUT CONTRAST  TECHNIQUE: Multidetector CT images of the thoracic were obtained using the standard protocol without intravenous contrast.  RADIATION DOSE REDUCTION: This exam was performed according to the departmental dose-optimization program which includes automated exposure control, adjustment of the mA and/or kV according to patient size and/or use of iterative reconstruction technique.  COMPARISON:  CT thoracolumbar junction 01/13/2012  FINDINGS: Alignment: Normal alignment of the thoracic spine.  Vertebrae: Compression of the T12 vertebra demonstrates progression since previous study. Acute progression is not excluded. There is about 80% loss of height centrally. No other vertebral compression deformities. Vertebral hemangioma at T8. No destructive or expansile bone lesions.  Paraspinal and other  soft tissues: No abnormal paraspinal soft tissue mass or infiltration. Calcification of the aorta. Small esophageal hiatal hernia.  Disc levels: Degenerative changes with disc space narrowing and endplate osteophyte formation throughout.  IMPRESSION: 1. Normal alignment of the thoracic spine. Progression of compression fracture at T12 since previous study from 2013. Acute progression is not excluded. 2. Degenerative changes throughout the thoracic spine. 3. Aortic atherosclerosis.  Small esophageal hiatal hernia.  Electronically Signed: By: Burman Nieves M.D. On: 01/05/2024 00:50 CT Cervical Spine Wo Contrast CLINICAL DATA:  Neck trauma after a fall.  EXAM: CT CERVICAL SPINE WITHOUT CONTRAST  TECHNIQUE: Multidetector CT imaging of the cervical spine was performed without intravenous contrast. Multiplanar CT image reconstructions were also generated.  RADIATION DOSE REDUCTION: This exam was performed according to the departmental dose-optimization program which includes automated exposure control, adjustment of the mA and/or kV according to patient size and/or use of iterative reconstruction technique.  COMPARISON:  12/24/2013  FINDINGS: Alignment: Normal alignment of the cervical spine and facet joints.  Skull base and vertebrae: Skull base appears intact. No vertebral compression deformities. No focal bone lesion or bone destruction.  Soft tissues and spinal canal: No prevertebral soft tissue swelling. No abnormal paraspinal soft tissue mass or infiltration.  Disc levels: Degenerative changes throughout the cervical spine with disc space narrowing and endplate osteophyte formation. Degenerative changes throughout the facet joints. Degenerative changes in the temporomandibular joints.  Upper chest: Visualized lung apices are clear.  Other: None.  IMPRESSION: Normal alignment of the cervical vertebrae. No acute displaced fractures identified. Diffuse  degenerative changes.  Electronically Signed   By: Burman Nieves M.D.   On: 01/05/2024 00:56 CT Lumbar Spine Wo Contrast CLINICAL DATA:  Back trauma due to a fall.  EXAM: CT LUMBAR SPINE WITHOUT CONTRAST  TECHNIQUE: Multidetector CT imaging of the lumbar spine was performed without intravenous contrast administration. Multiplanar CT image reconstructions were also generated.  RADIATION DOSE REDUCTION: This exam was performed according to the departmental dose-optimization program which includes automated exposure control, adjustment of the mA and/or kV according to patient size and/or use of iterative reconstruction technique.  COMPARISON:  Lumbar spine radiographs 01/17/2021. CT lumbar spine 01/13/2012  FINDINGS: Segmentation: 5  lumbar type vertebral bodies.  Alignment: Normal alignment.  Vertebrae: Chronic compression of the L2 vertebra post kyphoplasty. No significant progression. Mild retropulsion of fracture fragments at L2 is unchanged. No new compression deformities in the lumbar spine. Tarlov cysts in the sacrum.  Paraspinal and other soft tissues: No abnormal paraspinal soft tissue mass or infiltration. Calcification of the aorta.  Disc levels: Degenerative changes throughout with disc space narrowing and endplate osteophyte formation. Degenerative changes throughout the facet joints.  IMPRESSION: 1. Normal alignment of the lumbar spine. Old compression of L2 post kyphoplasty is unchanged. No acute bony abnormalities.  Electronically Signed   By: Burman Nieves M.D.   On: 01/05/2024 00:53 CT Head Wo Contrast CLINICAL DATA:  Minor head trauma. Patient fell on Plavix. Vertigo and dizziness this morning.  EXAM: CT HEAD WITHOUT CONTRAST  TECHNIQUE: Contiguous axial images were obtained from the base of the skull through the vertex without intravenous contrast.  RADIATION DOSE REDUCTION: This exam was performed according to the departmental  dose-optimization program which includes automated exposure control, adjustment of the mA and/or kV according to patient size and/or use of iterative reconstruction technique.  COMPARISON:  MRI brain 01/21/2022.  CT head 01/17/2021  FINDINGS: Brain: Mild diffuse cerebral atrophy. Low-attenuation changes throughout the deep white matter most consistent with small vessel ischemic changes. No progression since prior study. No mass-effect or midline shift. No abnormal extra-axial fluid collections. Basal cisterns are not effaced. No acute intracranial hemorrhage.  Vascular: No hyperdense vessel or unexpected calcification.  Skull: Normal. Negative for fracture or focal lesion.  Sinuses/Orbits: No acute finding.  Other: None.  IMPRESSION: No acute intracranial abnormalities. Chronic atrophy and small vessel ischemic changes similar to prior study.  Electronically Signed   By: Burman Nieves M.D.   On: 01/05/2024 00:47 DG Ribs Bilateral W/Chest CLINICAL DATA:  Rib pain. Patient fell. Vertigo with dizziness this morning.  EXAM: BILATERAL RIBS AND CHEST - 4+ VIEW  COMPARISON:  Chest radiograph 03/20/2018  FINDINGS: Heart size and pulmonary vascularity are normal. Lungs are clear. No pleural effusion or pneumothorax. Surgical clips over the right chest. Calcification of the aorta.  Fracture of the right posterior fourth rib with callus formation suggesting a healing fracture. No acute displaced fractures demonstrated in the right or left ribs. Soft tissues are unremarkable. Degenerative changes in the spine and shoulders.  IMPRESSION: 1. No evidence of active pulmonary disease. 2. Healing fracture in the right fourth rib. 3. No acute displaced rib fractures identified bilaterally.  Electronically Signed   By: Burman Nieves M.D.   On: 01/05/2024 00:30  Lab Results  Component Value Date   WBC 6.7 01/05/2024   HGB 13.0 01/05/2024   HCT 39.3 01/05/2024   MCV 94.5  01/05/2024   PLT 149 (L) 01/05/2024   Last metabolic panel Lab Results  Component Value Date   GLUCOSE 112 (H) 01/05/2024   NA 138 01/05/2024   K 4.4 01/05/2024   CL 108 01/05/2024   CO2 25 01/05/2024   BUN 20 01/05/2024   CREATININE 0.82 01/05/2024   GFRNONAA >60 01/05/2024   CALCIUM 8.1 (L) 01/05/2024   PROT 5.6 (L) 03/21/2018   ALBUMIN 2.9 (L) 03/21/2018   BILITOT 0.7 03/21/2018   ALKPHOS 66 03/21/2018   AST 39 03/21/2018   ALT 25 03/21/2018   ANIONGAP 5 01/05/2024    Assessment and Plan: * Syncope Fall Positive syncopal event at home with noted mild generalized dizziness/weakness prior to event. Grossly nonfocal exam on evaluation  CT head, chest x-ray grossly stable Differential remains fairly broad give age and co morbidities  Will need to rule out neurocardiogenic etiologies MRI of the brain as well as 2D echo x 1 Check orthostatics Gentle IV fluid hydration Resume meclizine in setting of history of vertigo  Urinalysis indicative of infection Concern for?  UTI as confounding issue Treat as appropriate Monitor  Suspected UTI Urinalysis indicative of infection in setting of noted syncopal event  Empirically covered w/ IV rocephin in the ER  Cont rocephin for now  Urine culture  Monitor    TIA (transient ischemic attack) Noted remote history of TIA in the past Check MRI of the brain x 1 in the setting of syncopal event Nonfocal neuroexam Monitor  Esophageal reflux PPI  Hyperlipidemia Continue statin pending CK level  HTN (hypertension) Holoman and normal blood pressures on presentation Hold BP regimen for now Monitor  Compression fracture of T12 vertebra (HCC) Noted T12 compression fracture on imaging-chronic versus acute on chronic issue Minimal back pain at present Consult neurosurgery as appropriate Pain control PT OT evaluation Monitor      Advance Care Planning:   Code Status: Limited: Do not attempt resuscitation (DNR) -DNR-LIMITED  -Do Not Intubate/DNI    Consults: None   Family Communication: No family at the bedside   Severity of Illness: The appropriate patient status for this patient is OBSERVATION. Observation status is judged to be reasonable and necessary in order to provide the required intensity of service to ensure the patient's safety. The patient's presenting symptoms, physical exam findings, and initial radiographic and laboratory data in the context of their medical condition is felt to place them at decreased risk for further clinical deterioration. Furthermore, it is anticipated that the patient will be medically stable for discharge from the hospital within 2 midnights of admission.   Author: Floydene Flock, MD 01/05/2024 9:02 AM  For on call review [email or web address].

## 2024-01-05 NOTE — Assessment & Plan Note (Signed)
 PPI ?

## 2024-01-05 NOTE — Assessment & Plan Note (Addendum)
 Fall Positive syncopal event at home with noted mild generalized dizziness/weakness prior to event. Grossly nonfocal exam on evaluation CT head, chest x-ray grossly stable Differential remains fairly broad give age and co morbidities  Will need to rule out neurocardiogenic etiologies MRI of the brain as well as 2D echo x 1 Check orthostatics Gentle IV fluid hydration Resume meclizine in setting of history of vertigo  Urinalysis indicative of infection Concern for?  UTI as confounding issue Treat as appropriate Monitor

## 2024-01-05 NOTE — Assessment & Plan Note (Signed)
 Holoman and normal blood pressures on presentation Hold BP regimen for now Monitor

## 2024-01-06 ENCOUNTER — Observation Stay

## 2024-01-06 DIAGNOSIS — M4854XA Collapsed vertebra, not elsewhere classified, thoracic region, initial encounter for fracture: Secondary | ICD-10-CM | POA: Diagnosis not present

## 2024-01-06 DIAGNOSIS — I5032 Chronic diastolic (congestive) heart failure: Secondary | ICD-10-CM | POA: Diagnosis not present

## 2024-01-06 DIAGNOSIS — I6389 Other cerebral infarction: Secondary | ICD-10-CM | POA: Diagnosis not present

## 2024-01-06 DIAGNOSIS — Z66 Do not resuscitate: Secondary | ICD-10-CM | POA: Diagnosis not present

## 2024-01-06 DIAGNOSIS — Z9011 Acquired absence of right breast and nipple: Secondary | ICD-10-CM | POA: Diagnosis not present

## 2024-01-06 DIAGNOSIS — Z9842 Cataract extraction status, left eye: Secondary | ICD-10-CM | POA: Diagnosis not present

## 2024-01-06 DIAGNOSIS — N390 Urinary tract infection, site not specified: Secondary | ICD-10-CM | POA: Diagnosis not present

## 2024-01-06 DIAGNOSIS — Z880 Allergy status to penicillin: Secondary | ICD-10-CM | POA: Diagnosis not present

## 2024-01-06 DIAGNOSIS — E785 Hyperlipidemia, unspecified: Secondary | ICD-10-CM | POA: Diagnosis not present

## 2024-01-06 DIAGNOSIS — Z886 Allergy status to analgesic agent status: Secondary | ICD-10-CM | POA: Diagnosis not present

## 2024-01-06 DIAGNOSIS — R569 Unspecified convulsions: Secondary | ICD-10-CM | POA: Diagnosis not present

## 2024-01-06 DIAGNOSIS — R29710 NIHSS score 10: Secondary | ICD-10-CM | POA: Diagnosis not present

## 2024-01-06 DIAGNOSIS — I110 Hypertensive heart disease with heart failure: Secondary | ICD-10-CM | POA: Diagnosis not present

## 2024-01-06 DIAGNOSIS — K219 Gastro-esophageal reflux disease without esophagitis: Secondary | ICD-10-CM | POA: Diagnosis not present

## 2024-01-06 DIAGNOSIS — Z7902 Long term (current) use of antithrombotics/antiplatelets: Secondary | ICD-10-CM | POA: Diagnosis not present

## 2024-01-06 DIAGNOSIS — E66811 Obesity, class 1: Secondary | ICD-10-CM | POA: Diagnosis not present

## 2024-01-06 DIAGNOSIS — Z79899 Other long term (current) drug therapy: Secondary | ICD-10-CM | POA: Diagnosis not present

## 2024-01-06 DIAGNOSIS — Z6830 Body mass index (BMI) 30.0-30.9, adult: Secondary | ICD-10-CM | POA: Diagnosis not present

## 2024-01-06 DIAGNOSIS — I639 Cerebral infarction, unspecified: Secondary | ICD-10-CM | POA: Diagnosis not present

## 2024-01-06 DIAGNOSIS — Z888 Allergy status to other drugs, medicaments and biological substances status: Secondary | ICD-10-CM | POA: Diagnosis not present

## 2024-01-06 DIAGNOSIS — Z961 Presence of intraocular lens: Secondary | ICD-10-CM | POA: Diagnosis not present

## 2024-01-06 DIAGNOSIS — D329 Benign neoplasm of meninges, unspecified: Secondary | ICD-10-CM | POA: Diagnosis not present

## 2024-01-06 DIAGNOSIS — G9340 Encephalopathy, unspecified: Secondary | ICD-10-CM | POA: Diagnosis not present

## 2024-01-06 DIAGNOSIS — R55 Syncope and collapse: Secondary | ICD-10-CM | POA: Diagnosis present

## 2024-01-06 DIAGNOSIS — Z8673 Personal history of transient ischemic attack (TIA), and cerebral infarction without residual deficits: Secondary | ICD-10-CM | POA: Diagnosis not present

## 2024-01-06 DIAGNOSIS — W19XXXA Unspecified fall, initial encounter: Secondary | ICD-10-CM | POA: Diagnosis present

## 2024-01-06 DIAGNOSIS — Z9049 Acquired absence of other specified parts of digestive tract: Secondary | ICD-10-CM | POA: Diagnosis not present

## 2024-01-06 DIAGNOSIS — Z9841 Cataract extraction status, right eye: Secondary | ICD-10-CM | POA: Diagnosis not present

## 2024-01-06 LAB — CBC
HCT: 36.7 % (ref 36.0–46.0)
Hemoglobin: 11.8 g/dL — ABNORMAL LOW (ref 12.0–15.0)
MCH: 30.6 pg (ref 26.0–34.0)
MCHC: 32.2 g/dL (ref 30.0–36.0)
MCV: 95.1 fL (ref 80.0–100.0)
Platelets: 144 10*3/uL — ABNORMAL LOW (ref 150–400)
RBC: 3.86 MIL/uL — ABNORMAL LOW (ref 3.87–5.11)
RDW: 13.8 % (ref 11.5–15.5)
WBC: 4.7 10*3/uL (ref 4.0–10.5)
nRBC: 0 % (ref 0.0–0.2)

## 2024-01-06 LAB — COMPREHENSIVE METABOLIC PANEL WITH GFR
ALT: 14 U/L (ref 0–44)
AST: 16 U/L (ref 15–41)
Albumin: 3.1 g/dL — ABNORMAL LOW (ref 3.5–5.0)
Alkaline Phosphatase: 47 U/L (ref 38–126)
Anion gap: 7 (ref 5–15)
BUN: 20 mg/dL (ref 8–23)
CO2: 25 mmol/L (ref 22–32)
Calcium: 8.1 mg/dL — ABNORMAL LOW (ref 8.9–10.3)
Chloride: 108 mmol/L (ref 98–111)
Creatinine, Ser: 0.75 mg/dL (ref 0.44–1.00)
GFR, Estimated: >60 mL/min (ref >60)
Glucose, Bld: 114 mg/dL — ABNORMAL HIGH (ref 70–99)
Potassium: 3.2 mmol/L — ABNORMAL LOW (ref 3.5–5.1)
Sodium: 140 mmol/L (ref 135–145)
Total Bilirubin: 1 mg/dL (ref 0.0–1.2)
Total Protein: 5.7 g/dL — ABNORMAL LOW (ref 6.5–8.1)

## 2024-01-06 LAB — HEMOGLOBIN A1C
Hgb A1c MFr Bld: 5.3 % (ref 4.8–5.6)
Mean Plasma Glucose: 105.41 mg/dL

## 2024-01-06 LAB — LIPID PANEL
Cholesterol: 146 mg/dL (ref 0–200)
HDL: 42 mg/dL (ref >40)
LDL Cholesterol: 86 mg/dL (ref 0–99)
Total CHOL/HDL Ratio: 3.5 ratio
Triglycerides: 89 mg/dL (ref <150)
VLDL: 18 mg/dL (ref 0–40)

## 2024-01-06 LAB — GLUCOSE, CAPILLARY: Glucose-Capillary: 95 mg/dL (ref 70–99)

## 2024-01-06 MED ORDER — POTASSIUM CHLORIDE CRYS ER 20 MEQ PO TBCR
40.0000 meq | EXTENDED_RELEASE_TABLET | Freq: Once | ORAL | Status: AC
  Administered 2024-01-06: 40 meq via ORAL
  Filled 2024-01-06: qty 2

## 2024-01-06 MED ORDER — SERTRALINE HCL 50 MG PO TABS
50.0000 mg | ORAL_TABLET | Freq: Two times a day (BID) | ORAL | Status: DC
  Administered 2024-01-06 – 2024-01-08 (×4): 50 mg via ORAL
  Filled 2024-01-06 (×4): qty 1

## 2024-01-06 MED ORDER — DIAZEPAM 2 MG PO TABS
2.0000 mg | ORAL_TABLET | Freq: Four times a day (QID) | ORAL | Status: DC | PRN
  Administered 2024-01-06 – 2024-01-08 (×4): 2 mg via ORAL
  Filled 2024-01-06 (×4): qty 1

## 2024-01-06 MED ORDER — TRAZODONE HCL 50 MG PO TABS
50.0000 mg | ORAL_TABLET | Freq: Every day | ORAL | Status: DC
  Administered 2024-01-06 – 2024-01-07 (×2): 50 mg via ORAL
  Filled 2024-01-06 (×2): qty 1

## 2024-01-06 MED ORDER — FLUTICASONE PROPIONATE 50 MCG/ACT NA SUSP
2.0000 | Freq: Every day | NASAL | Status: DC
  Administered 2024-01-08: 2 via NASAL
  Filled 2024-01-06: qty 16

## 2024-01-06 MED ORDER — IOHEXOL 350 MG/ML SOLN
75.0000 mL | Freq: Once | INTRAVENOUS | Status: AC | PRN
  Administered 2024-01-06: 75 mL via INTRAVENOUS

## 2024-01-06 NOTE — Progress Notes (Signed)
 Occupational Therapy Treatment Patient Details Name: Lindsey Barnes MRN: [id number] DOB: [birth year] Today's Date: 01/06/2024   History of present illness 89 y.o. female with medical history significant of TIA, chronic back pain, hypertension, GERD, hard of hearing presenting with fall, syncopal event.  Patient reports syncopal event in the washroom.  Patient reports mild generalized dizziness prior to event.  Some reports of vertigo although patient currently denies.  No chest pain or shortness of breath.  Episode happened around 5 or 6 AM today.  Denies any chest pain prior to event.  No abdominal pain.  No nausea or vomiting.  Patient was unable to get up from the floor.  Life alert button was pressed.  No reported dysuria or increased urinary frequency.   OT comments  Pt seen for OT tx. Pt pleasant and agreeable, requiring CGA-MIN A for all aspects of mobility with RW with 2 slight LOBs while ambulating and negotiating obstacles in the room and bathroom. Pt able to complete LB dressing, toilet transfer, pericare/clothing mgt, and standing grooming with CGA. Intermittent VC for safety, safe RW use as she attempts to leave it to the side when approaching the sink. HR up to 120's with exertion. Pt/dtr educated in activity pacing, work simplification, and importance of rest breaks to support safety. Pt/dtr verbalized understanding. Progressing well.       If plan is discharge home, recommend the following:  A little help with walking and/or transfers;A little help with bathing/dressing/bathroom;Assistance with cooking/housework;Assist for transportation;Help with stairs or ramp for entrance   Equipment Recommendations  None recommended by OT    Recommendations for Other Services      Precautions / Restrictions Precautions Precautions: Fall Restrictions Weight Bearing Restrictions Per Provider Order: No       Mobility Bed Mobility Overal bed mobility: Needs Assistance Bed Mobility:  Supine to Sit, Sit to Supine     Supine to sit: Contact guard Sit to supine: Contact guard assist        Transfers Overall transfer level: Needs assistance Equipment used: Rolling walker (2 wheels) Transfers: Sit to/from Stand Sit to Stand: Contact guard assist                 Balance Overall balance assessment: Needs assistance Sitting-balance support: Feet supported, Single extremity supported Sitting balance-Leahy Scale: Good     Standing balance support: No upper extremity supported, During functional activity Standing balance-Leahy Scale: Fair Standing balance comment: standing at sink with intermittent light UE support                           ADL either performed or assessed with clinical judgement   ADL Overall ADL's : Needs assistance/impaired     Grooming: Contact guard assist;Wash/dry hands;Supervision/safety;Standing Grooming Details (indicate cue type and reason): VC and TC for maintaining RW in front of her when approaching sink             Lower Body Dressing: Sit to/from stand;Contact guard assist   Toilet Transfer: Contact guard assist;Rolling walker (2 wheels);BSC/3in1;Regular Teacher, adult education Details (indicate cue type and reason): "BSC over toilet Toileting- Clothing Manipulation and Hygiene: Modified independent;Sitting/lateral lean       Functional mobility during ADLs: Contact guard assist;Minimal assistance;Rolling walker (2 wheels)      Extremity/Trunk Assessment              Vision       Perception     Praxis  Communication Communication Communication: Impaired Factors Affecting Communication: Hearing impaired   Cognition Arousal: Alert Behavior During Therapy: WFL for tasks assessed/performed Cognition: No apparent impairments, Difficult to assess Difficult to assess due to: Hard of hearing/deaf                             Following commands: Intact        Cueing       Exercises Other Exercises Other Exercises: Pt/dtr educated in activity pacing, work simplification, and importance of rest breaks to support safety    Shoulder Instructions       General Comments HR in 120's with exertion. Small wound/scab that was bleeding noted during session. RN notified.    Pertinent Vitals/ Pain       Pain Assessment Pain Assessment: No/denies pain  Home Living Family/patient expects to be discharged to:: Private residence Living Arrangements: Alone Available Help at Discharge: Family;Available 24 hours/day Type of Home: House Home Access: Stairs to enter [employer] of Steps: 3 stairs garage, 1 railing Entrance Stairs-Rails: Right Home Layout: One level     Bathroom Shower/Tub: Tub/shower unit         Home Equipment: Rollator (4 wheels);Cane - single point;Shower seat;Grab bars - toilet;Grab bars - tub/shower          Prior Functioning/Environment              Frequency  Min 1X/week        Progress Toward Goals  OT Goals(current goals can now be found in the care plan section)  Progress towards OT goals: Progressing toward goals  Acute Rehab OT Goals Patient Stated Goal: go home OT Goal Formulation: With patient/family Time For Goal Achievement: 01/19/24 Potential to Achieve Goals: Good  Plan      Co-evaluation                 AM-PAC OT "6 Clicks" Daily Activity     Outcome Measure   Help from another person eating meals?: None Help from another person taking care of personal grooming?: None Help from another person toileting, which includes using toliet, bedpan, or urinal?: A Little Help from another person bathing (including washing, rinsing, drying)?: A Little Help from another person to put on and taking off regular upper body clothing?: None Help from another person to put on and taking off regular lower body clothing?: A Little 6 Click Score: 21    End of Session Equipment Utilized During  Treatment: Rolling walker (2 wheels)  OT Visit Diagnosis: Unsteadiness on feet (R26.81);Repeated falls (R29.6);Muscle weakness (generalized) (M62.81)   Activity Tolerance Patient tolerated treatment well   Patient Left in bed;with call bell/phone within reach;with bed alarm set;with family/visitor present   Nurse Communication Mobility status;Other (comment) (wound on L posterior upper leg)        Time: 1041-1101 OT Time Calculation (min): 20 min  Charges: OT General Charges $OT Visit: 1 Visit OT Treatments $Self Care/Home Management : 8-22 mins  Arman Filter., MPH, MS, OTR/L ascom [phone] 01/06/24, 1:34 PM

## 2024-01-06 NOTE — Progress Notes (Signed)
 Progress Note   Patient: Lindsey Barnes VHQ:[id number] DOB: 23-Dec-1932 DOA: 01/04/2024     0 DOS: the patient was seen and examined on 01/06/2024   Brief hospital course: 89yo with h/o TIA, chronic back pain, and HTN who presented on 3/25 with a fall and syncope.  MRI with punctate acute infarct of posterior L frontal lobe cortex, neurology consulted.  Concern for UTI, started on ceftriaxone.    Assessment and Plan:  Syncope Fall Possible syncopal event at home with noted mild generalized dizziness/weakness prior to event Grossly nonfocal exam on evaluation CT head, chest x-ray grossly stable MRI with punctate acute infract of posterior L frontal cortex Resume meclizine in setting of history of vertigo  Concern for?  UTI as confounding issue  Acute CVA Noted remote history of TIA in the past MRI with punctate acute infract of posterior L frontal cortex Nonfocal neuroexam Echo unremarkable PT/OT consulting and recommend ongoing hospitalization overnight and then home PT/OT once more stable Continue Plavix; may need DAPT but will defer to neurology Neurology consulted, will see on 3/28   Possible UTI Urinalysis with small LE, rare bacteria Empirically covered w/ IV rocephin in the ER  Hold further antibiotics at this time  Chronic diastolic CHF Echo on 3/26 with preserved EF and grade 1 diastolic dysfunction Appears compensated This was also present on 01/2021 echo   Esophageal reflux PPI   Hyperlipidemia Continue statin pending CK level   HTN (hypertension) Controlled without medications at this time Hold bisoprolol, hydrochlorothiazide; she may also be taking propranolol (med rec pending)   Compression fracture of T12 vertebra (HCC) Noted T12 compression fracture on imaging-chronic versus acute on chronic issue Minimal back pain at present Consult neurosurgery if needed PT OT evaluation  Class 1 Obesity Body mass index is 30.21 kg/m.Marland Kitchen  Weight loss should  be encouraged Outpatient PCP/bariatric medicine f/u encouraged Significantly low or high BMI is associated with higher medical risk including morbidity and mortality       Consultants: Neurology PT OT  Procedures: Echocardiogram 3/26  Antibiotics: Ceftriaxone x 1 dose      Subjective: Feeling better today, still mildly confused but much improved per family.  Has not been OOB.   Objective: Vitals:   01/05/24 1941 01/06/24 0021  BP: 115/61 (!) 112/58  Pulse: 77 81  Resp: 17 16  Temp: 97.9 F (36.6 C) (!) 97.4 F (36.3 C)  SpO2: 98% 93%    Intake/Output Summary (Last 24 hours) at 01/06/2024 1603 Last data filed at 01/06/2024 1100 Gross per 24 hour  Intake 0 ml  Output --  Net 0 ml   Filed Weights   01/04/24 2338 01/06/24 0500  Weight: 86.6 kg 87.5 kg    Exam:  General:  Appears calm and comfortable and is in NAD Eyes: EOMI, normal lids, iris ENT:  grossly normal hearing, lips & tongue, mmm Neck:  no LAD, masses or thyromegaly Cardiovascular:  RRR, no m/r/g. No LE edema.  Respiratory:   CTA bilaterally with no wheezes/rales/rhonchi.  Normal respiratory effort. Abdomen:  soft, NT, ND Skin:  no rash or induration seen on limited exam Musculoskeletal:  grossly normal tone BUE/BLE, good ROM, no bony abnormality Psychiatric:  grossly normal mood and affect, speech fluent and appropriate, AOx3 Neurologic:  CN 2-12 grossly intact, moves all extremities in coordinated fashion  Data Reviewed: I have reviewed the patient's lab results since admission.  Pertinent labs for today include:   K+ 3.2 Glucose 114 Albumin 3.1 Lipids: 146/42/86/89  WBC 4.7 Hgb 11.8 Platelets 144 A1c 5.3 UA: small LE, rare bacteria    Family Communication: Family friend was present throughout evaluation  Disposition: Status is: Observation The patient remains OBS appropriate and will d/c before 2 midnights.     Time spent: 50 minutes  Unresulted Labs (From admission,  onward)     Start     Ordered   01/07/24 0500  CBC with Differential/Platelet  Tomorrow morning,   R        01/06/24 1603   01/07/24 0500  Basic metabolic panel with GFR  Tomorrow morning,   R        01/06/24 1603             Author: Jonah Blue, MD 01/06/2024 4:03 PM  For on call review [email or web address].

## 2024-01-06 NOTE — Evaluation (Addendum)
 Speech Language Pathology Evaluation Patient Details Name: Lindsey Barnes MRN: [id number] DOB: 16-Mar-1933 Today's Date: 01/06/2024 Time: 0865-7846 SLP Time Calculation (min) (ACUTE ONLY): 60 min  Problem List:  Patient Active Problem List   Diagnosis Date Noted   Syncope 01/05/2024   Suspected UTI 01/05/2024   Acute ischemic left ACA stroke (HCC) 01/18/2021   Acute CVA (cerebrovascular accident) (HCC) 01/17/2021   Depression, controlled 04/01/2018   Esophageal reflux 04/01/2018   Senile osteoporosis 04/01/2018   Acute cholecystitis 03/20/2018   Insomnia, unspecified 12/11/2014   Compression fracture of T12 vertebra (HCC) 01/13/2012   HTN (hypertension) 01/13/2012   Hyperlipidemia 01/13/2012   Low back pain 01/13/2012   Past Medical History:  Past Medical History:  Diagnosis Date   Anxiety    Breast cancer (HCC)    1990   Complication of anesthesia    Depression    Dyspnea    DOE   GERD (gastroesophageal reflux disease)    Headache(784.0)    HOH (hard of hearing)    Hypertension    Lymphoma (HCC)    Pain    CHRONIC BACK   Pain    CHRONIC BACK   Pneumonia    PONV (postoperative nausea and vomiting)    Tremors of nervous system    HANDS OCCAS   Past Surgical History:  Past Surgical History:  Procedure Laterality Date   APPENDECTOMY     BACK SURGERY     BREAST SURGERY     CATARACT EXTRACTION W/PHACO Left 01/26/2017   Procedure: CATARACT EXTRACTION PHACO AND INTRAOCULAR LENS PLACEMENT (IOC);  Surgeon: Galen Manila, MD;  Location: [facility] ORS;  Service: Ophthalmology;  Laterality: Left;  [place] 53.7 AP% 20.3 CDE 10.90 Fluid pack lot # [id number]   CATARACT EXTRACTION W/PHACO Right 02/16/2017   Procedure: CATARACT EXTRACTION PHACO AND INTRAOCULAR LENS PLACEMENT (IOC);  Surgeon: Galen Manila, MD;  Location: [facility] ORS;  Service: Ophthalmology;  Laterality: Right;  [place] 00:55 AP% 22.5 CDE 12.43 Fluid pack lot # [id number]   CESAREAN SECTION     CHOLECYSTECTOMY  N/A 03/20/2018   Procedure: LAPAROSCOPIC CHOLECYSTECTOMY;  Surgeon: Carolan Shiver, MD;  Location: [facility] ORS;  Service: General;  Laterality: N/A;   MASTECTOMY Right 1992   TUBAL LIGATION     HPI:  Pt is an 89 y.o. female with medical history significant of TIA, chronic back pain, hypertension, GERD, hard of hearing presenting with fall, syncopal event, and hypotensive state. Also concern for UTI.  Patient reports syncopal event in the bathroom.  Patient reports mild generalized dizziness prior to event. Some reports of vertigo although patient currently denies.  No chest pain or shortness of breath.  No abdominal pain.  No nausea or vomiting.  Patient was unable to get up from the floor so she pushed her Life Alert button for help.        MRI: Punctate focus of acute infarct involving the cortex of the posterior left frontal lobe.   2. Sequela of moderate, chronic white matter microangiopathy. Small remote infarcts in the right centrum semiovale and left corona radiata per chart Imaging.    Assessment / Plan / Recommendation Clinical Impression   Pt seen for informal Cognitive-communication and Language assessment at bedside today. Pt was awake, quite verbal and talkative. Min+ HOH -- she misses some spoken/verbal communication d/t the [facility]. Pt followed all commands appropriately. Daughter was present -- stated pt was "much better than last night". Pt has a loose-fitting lower Denture plate at baseline.  On RA,  afebrile. WBC WNL.    OF NOTE: there were many distractions in the room during this assessment; pt is in a Double-occupancy room. Both distractions and HOH can impact performance on assessment.   Patient presents with mild cognitive communication impairment characterized by deficits in the areas of memory/recall of new/recent information. Per performance on the [employer] Mental Status(SLUMS), pt's scored a 23/30, which with HS education is indicative of mild neurocognitive  deficits. Deficits in memory/recall may impact ability to independently manage finances, medications, and maintain safety in the home environment.  Recommend SLP f/u at next venue of care and/or in Home environment to see IF any change/decline from her Baseline functioning and cognitive performance in her ADLS in her Home environment.   Primary deficits were associated with memory retrieval tasks. Pt's accuracy improved significantly with verbal cueing. She reports mild memory issues w/ tasks like the Paragraph Task stating: "honey, that's a lot of information there". She demonstrated strengths in basic math calculations, reasoning, and problem-solving -- identified 2 responses in an emergency situation: "using my Life Alert button; calling 911". Basic executive function tasks and fluency were [facility].  Pt did Not exhibit overt expressive nor receptive aphasia; language engagement and responses were [facility]. Full and accurate description and reasoning of Pictured Scene given by pt. No motor speech deficits noted. OM Exam was [facility] for strength/ROM/symmetry. Pt does have a Loose lower Denture plate which can impact articulation of speech.   Pt does live in the Home w/ Family. Recommend f/u with SLP for Cognitive therapy in outpatient setting for memory IF any needs identified post return to setting. Pt/Daughter in agreement with plan.     SLP Assessment  SLP Recommendation/Assessment: All further Speech Lanaguage Pathology  needs can be addressed in the next venue of care (if needs indicate) SLP Visit Diagnosis: Cognitive communication deficit (R41.841)    Recommendations for follow up therapy are one component of a multi-disciplinary discharge planning process, led by the attending physician.  Recommendations may be updated based on patient status, additional functional criteria and insurance authorization.    Follow Up Recommendations  Follow physician's recommendations for discharge plan and follow up  therapies (at next venue of care and/or in Home environment to see IF any change/decline from her Baseline functioning)    Assistance Recommended at Discharge  Set up Supervision/Assistance (>Intermittent support as needed d/t [facility])  Functional Status Assessment Patient has had a recent decline in their functional status and demonstrates the ability to make significant improvements in function in a reasonable and predictable amount of time.  Frequency and Duration  (n/a)   (n/a)      SLP Evaluation Cognition  Overall Cognitive Status: Within Functional Limits for tasks assessed Arousal/Alertness: Awake/alert Orientation Level: Oriented X4;Oriented to person;Oriented to place;Oriented to time;Oriented to situation Year: 2025 Month: March Attention: Focused;Sustained Focused Attention: Appears intact Sustained Attention: Appears intact Memory: Impaired Memory Impairment: Retrieval deficit (specific details of information of past events: seemed to tie bits of information together to tell a story 1x); Immediate>short term recall Awareness: Appears intact Problem Solving: Appears intact Executive Function: Reasoning;Sequencing Reasoning: Appears intact Sequencing: Appears intact Behaviors:  (none) Safety/Judgment: Appears intact Comments: id'd 2 ways to seek help during an emergency: life alert; 911       Comprehension  Auditory Comprehension Overall Auditory Comprehension: Appears within functional limits for tasks assessed Yes/No Questions: Within Functional Limits Commands: Within Functional Limits Conversation: Simple (>Complex) Other Conversation Comments: pt was quite verbal and engaged  appropriately Interfering Components: Hearing EffectiveTechniques: Increased volume;Repetition Visual Recognition/Discrimination Discrimination: Not tested Reading Comprehension Reading Status: Not tested  Object functioning: WFL   Expression Expression Primary Mode of Expression:  Verbal Verbal Expression Overall Verbal Expression: Appears within functional limits for tasks assessed Initiation: No impairment Automatic Speech:  (WFL) Level of Generative/Spontaneous Verbalization: Conversation Repetition: No impairment Naming: No impairment Pragmatics: No impairment Interfering Components:  (Hearing) Effective Techniques:  (Repetition) Non-Verbal Means of Communication: Not applicable Written Expression Dominant Hand: Right Written Expression: Not tested   Oral / Motor  Oral Motor/Sensory Function Overall Oral Motor/Sensory Function: Within functional limits (loose-fitting lower Denture plate) Motor Speech Overall Motor Speech: Appears within functional limits for tasks assessed Respiration: Within functional limits Phonation: Normal Resonance: Within functional limits Articulation: Within functional limitis (loose-fitting lower Denture plate impact) Intelligibility: Intelligible Motor Planning: Witnin functional limits Motor Speech Errors: Not applicable Interfering Components: Inadequate dentition;Hearing loss               Jerilynn Som, MS, CCC-SLP Speech Language Pathologist Rehab Services; [facility] [phone] (ascom) Jaycee Mckellips 01/06/2024, 6:04 PM

## 2024-01-06 NOTE — Hospital Course (Addendum)
 89yo with h/o TIA, chronic back pain, and HTN who presented on 3/25 with a fall and syncope.  MRI with punctate acute infarct of posterior L frontal lobe cortex, neurology consulted.  Concern for UTI, started on ceftriaxone.

## 2024-01-06 NOTE — Plan of Care (Signed)
  Problem: Education: Goal: Knowledge of condition and prescribed therapy will improve Outcome: Progressing   Problem: Physical Regulation: Goal: Complications related to the disease process, condition or treatment will be avoided or minimized Outcome: Progressing   Problem: Clinical Measurements: Goal: Will remain free from infection Outcome: Progressing   Problem: Elimination: Goal: Will not experience complications related to bowel motility Outcome: Progressing

## 2024-01-06 NOTE — Consult Note (Incomplete)
 Presented with syncope  TTE Left ventricular ejection fraction, by estimation, is 60 to 65% . The left ventricle has normal function. The left ventricle has no regional wall motion abnormalities. Left ventricular diastolic parameters are consistent with Grade I diastolic dysfunction ( impaired relaxation) . 2. Right ventricular systolic function is normal. The right ventricular size is normal. There is normal pulmonary artery systolic pressure. The estimated right ventricular systolic pressure is 29. 0 mmHg. 3. The mitral valve is normal in structure. Moderate mitral valve regurgitation. No evidence of mitral stenosis. 4. The aortic valve is normal in structure. Aortic valve regurgitation is not visualized. No aortic stenosis is present. 5. The inferior vena cava is normal in size with greater than 50% respiratory variability, suggesting right atrial pressure of 3 mmHg.  MRI brain wo  Punctate focus of acute infarct involving the cortex of the posterior left frontal lobe.   Similar appearance of meningiomas over the right frontal lobe and anterior falx.   No midline shift.   Moderate chronic microvascular ischemic changes. 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  Cerebrovascular imaging pending  - Continue plavix indefinitely (allergic to ASA)

## 2024-01-06 NOTE — Plan of Care (Signed)
 Patient able to verbalize needs and voices understanding of ways ti improve health condition. Continues to move freely in bed. Able to stand and pivot with assist of writer to [facility]. Performs peri-care. No resp or cardiac complications this shift. Resp even and unlabored. Denies SOB. Continent of bowel and bladder. Uses BSC. Participates in care as much as possible. Cooperative with care. Problem: Education: Goal: Knowledge of condition and prescribed therapy will improve Outcome: Progressing   Problem: Cardiac: Goal: Will achieve and/or maintain adequate cardiac output Outcome: Progressing   Problem: Physical Regulation: Goal: Complications related to the disease process, condition or treatment will be avoided or minimized Outcome: Progressing   Problem: Education: Goal: Knowledge of General Education information will improve Description: Including pain rating scale, medication(s)/side effects and non-pharmacologic comfort measures Outcome: Progressing   Problem: Health Behavior/Discharge Planning: Goal: Ability to manage health-related needs will improve Outcome: Progressing   Problem: Clinical Measurements: Goal: Ability to maintain clinical measurements within normal limits will improve Outcome: Progressing Goal: Will remain free from infection Outcome: Progressing Goal: Diagnostic test results will improve Outcome: Progressing Goal: Respiratory complications will improve Outcome: Progressing Goal: Cardiovascular complication will be avoided Outcome: Progressing   Problem: Activity: Goal: Risk for activity intolerance will decrease Outcome: Progressing   Problem: Nutrition: Goal: Adequate nutrition will be maintained Outcome: Progressing   Problem: Coping: Goal: Level of anxiety will decrease Outcome: Progressing   Problem: Elimination: Goal: Will not experience complications related to bowel motility Outcome: Progressing Goal: Will not experience complications  related to urinary retention Outcome: Progressing   Problem: Pain Managment: Goal: General experience of comfort will improve and/or be controlled Outcome: Progressing   Problem: Safety: Goal: Ability to remain free from injury will improve Outcome: Progressing   Problem: Skin Integrity: Goal: Risk for impaired skin integrity will decrease Outcome: Progressing   Problem: Education: Goal: Knowledge of disease or condition will improve Outcome: Progressing Goal: Knowledge of secondary prevention will improve (MUST DOCUMENT ALL) Outcome: Progressing Goal: Knowledge of patient specific risk factors will improve (DELETE if not current risk factor) Outcome: Progressing   Problem: Ischemic Stroke/TIA Tissue Perfusion: Goal: Complications of ischemic stroke/TIA will be minimized Outcome: Progressing   Problem: Coping: Goal: Will verbalize positive feelings about self Outcome: Progressing Goal: Will identify appropriate support needs Outcome: Progressing   Problem: Health Behavior/Discharge Planning: Goal: Ability to manage health-related needs will improve Outcome: Progressing   Problem: Self-Care: Goal: Ability to participate in self-care as condition permits will improve Outcome: Progressing Goal: Verbalization of feelings and concerns over difficulty with self-care will improve Outcome: Progressing   Problem: Nutrition: Goal: Risk of aspiration will decrease Outcome: Progressing Goal: Dietary intake will improve Outcome: Progressing

## 2024-01-07 ENCOUNTER — Inpatient Hospital Stay

## 2024-01-07 DIAGNOSIS — R55 Syncope and collapse: Secondary | ICD-10-CM | POA: Diagnosis not present

## 2024-01-07 DIAGNOSIS — R569 Unspecified convulsions: Secondary | ICD-10-CM | POA: Diagnosis not present

## 2024-01-07 DIAGNOSIS — I639 Cerebral infarction, unspecified: Secondary | ICD-10-CM | POA: Diagnosis not present

## 2024-01-07 DIAGNOSIS — W19XXXA Unspecified fall, initial encounter: Secondary | ICD-10-CM | POA: Diagnosis not present

## 2024-01-07 LAB — GLUCOSE, CAPILLARY
Glucose-Capillary: 115 mg/dL — ABNORMAL HIGH (ref 70–99)
Glucose-Capillary: 134 mg/dL — ABNORMAL HIGH (ref 70–99)

## 2024-01-07 LAB — CBC WITH DIFFERENTIAL/PLATELET
Abs Immature Granulocytes: 0.02 10*3/uL (ref 0.00–0.07)
Basophils Absolute: 0 10*3/uL (ref 0.0–0.1)
Basophils Relative: 0 %
Eosinophils Absolute: 0.1 10*3/uL (ref 0.0–0.5)
Eosinophils Relative: 3 %
HCT: 36.5 % (ref 36.0–46.0)
Hemoglobin: 12.1 g/dL (ref 12.0–15.0)
Immature Granulocytes: 0 %
Lymphocytes Relative: 17 %
Lymphs Abs: 0.9 10*3/uL (ref 0.7–4.0)
MCH: 30.9 pg (ref 26.0–34.0)
MCHC: 33.2 g/dL (ref 30.0–36.0)
MCV: 93.4 fL (ref 80.0–100.0)
Monocytes Absolute: 0.5 10*3/uL (ref 0.1–1.0)
Monocytes Relative: 10 %
Neutro Abs: 3.4 10*3/uL (ref 1.7–7.7)
Neutrophils Relative %: 70 %
Platelets: 151 10*3/uL (ref 150–400)
RBC: 3.91 MIL/uL (ref 3.87–5.11)
RDW: 13.9 % (ref 11.5–15.5)
WBC: 4.9 10*3/uL (ref 4.0–10.5)
nRBC: 0 % (ref 0.0–0.2)

## 2024-01-07 LAB — BASIC METABOLIC PANEL WITH GFR
Anion gap: 8 (ref 5–15)
BUN: 14 mg/dL (ref 8–23)
CO2: 24 mmol/L (ref 22–32)
Calcium: 8.2 mg/dL — ABNORMAL LOW (ref 8.9–10.3)
Chloride: 108 mmol/L (ref 98–111)
Creatinine, Ser: 0.62 mg/dL (ref 0.44–1.00)
GFR, Estimated: >60 mL/min (ref >60)
Glucose, Bld: 114 mg/dL — ABNORMAL HIGH (ref 70–99)
Potassium: 3.3 mmol/L — ABNORMAL LOW (ref 3.5–5.1)
Sodium: 140 mmol/L (ref 135–145)

## 2024-01-07 MED ORDER — POTASSIUM CHLORIDE CRYS ER 20 MEQ PO TBCR
40.0000 meq | EXTENDED_RELEASE_TABLET | Freq: Once | ORAL | Status: AC
  Administered 2024-01-07: 40 meq via ORAL
  Filled 2024-01-07: qty 2

## 2024-01-07 MED ORDER — ATORVASTATIN CALCIUM 20 MG PO TABS
40.0000 mg | ORAL_TABLET | Freq: Every day | ORAL | Status: DC
  Administered 2024-01-07 – 2024-01-08 (×2): 40 mg via ORAL
  Filled 2024-01-07 (×2): qty 2

## 2024-01-07 NOTE — Progress Notes (Signed)
 Physical Therapy Treatment Patient Details Name: Lindsey Barnes MRN: [id number] DOB: 05/15/1933 Today's Date: 01/07/2024   History of Present Illness 89 y.o. female with medical history significant of TIA, chronic back pain, hypertension, GERD, hard of hearing presenting with fall, syncopal event.  Patient reports syncopal event in the washroom.  Patient reports mild generalized dizziness prior to event.  Some reports of vertigo although patient currently denies.  No chest pain or shortness of breath.  Episode happened around 5 or 6 AM today.  Denies any chest pain prior to event.  No abdominal pain.  No nausea or vomiting.  Patient was unable to get up from the floor.  Life alert button was pressed.  No reported dysuria or increased urinary frequency.    PT Comments  Author responded to pt's call for getting to BR. She endorses need to use BR to urinate. Pt demonstrated safe abilities to exit bed, stand to RW, and ambulate to/from BR. Author encouraged pt to ambulate further and attempt stairs however was unwilling. Pt's daughter present and states she is having a ramp built for home entry/exit. Pt does demonstrate the strength and abilities to perform stairs however not observed. Will promote further ambulation and stairs next PT session. DC recs remain appropriate. Acute PT will continue to follow per current POC.    If plan is discharge home, recommend the following: A little help with walking and/or transfers;A little help with bathing/dressing/bathroom     Equipment Recommendations  Rolling walker (2 wheels)       Precautions / Restrictions Precautions Precautions: Fall Recall of Precautions/Restrictions: Intact Restrictions Weight Bearing Restrictions Per Provider Order: No     Mobility  Bed Mobility Overal bed mobility: Needs Assistance Bed Mobility: Supine to Sit  Supine to sit: Supervision  General bed mobility comments: no physical assistance required to exit L side of  bed.    Transfers Overall transfer level: Needs assistance Equipment used: Rolling walker (2 wheels) Transfers: Sit to/from Stand Sit to Stand: Supervision  General transfer comment: pt easily able to stand EOB and from [facility] placed over toilet to RW.    Ambulation/Gait Ambulation/Gait assistance: Supervision Gait Distance (Feet): 50 Feet Assistive device: Rolling walker (2 wheels) Gait Pattern/deviations: Step-through pattern  General Gait Details: no LOB with use of RW. Pt does endorse fatigue however HR < 90bpm throughout. encouraged pt to attempt stairs but was unwilling at this time. Will promote stairs next session   Stairs Stairs:  (pt was unwilling)     Balance Overall balance assessment: Needs assistance Sitting-balance support: Feet supported, Single extremity supported Sitting balance-Leahy Scale: Good     Standing balance support: Bilateral upper extremity supported, During functional activity, Reliant on assistive device for balance Standing balance-Leahy Scale: Good Standing balance comment: author encouraged pt to use RW at all times until strength and activity tolerance improves.     Communication Communication Communication: No apparent difficulties Factors Affecting Communication: Hearing impaired  Cognition Arousal: Alert Behavior During Therapy: WFL for tasks assessed/performed   PT - Cognitive impairments: No apparent impairments    Following commands: Intact      Cueing Cueing Techniques: Verbal cues, Gestural cues, Tactile cues, Visual cues         Pertinent Vitals/Pain Pain Assessment Pain Assessment: 0-10 Pain Score: 2  Faces Pain Scale:  (chronic back pain) Pain Location: low back Pain Descriptors / Indicators: Aching, Discomfort Pain Intervention(s): Limited activity within patient's tolerance, Monitored during session, Premedicated before session, Repositioned  Home Living Family/patient expects to be discharged to:: Private  residence Living Arrangements: Alone Available Help at Discharge: Family;Available 24 hours/day Type of Home: House Home Access: Stairs to enter Entrance Stairs-Rails: Right Entrance Stairs-Number of Steps: 3 stairs garage, 1 railing   Home Layout: One level Home Equipment: Rollator (4 wheels);Cane - single point;Shower seat;Grab bars - toilet;Grab bars - tub/shower          PT Goals (current goals can now be found in the care plan section) Acute Rehab PT Goals Patient Stated Goal: return to home PT Goal Formulation: With patient Time For Goal Achievement: 01/21/24 Potential to Achieve Goals: Good Progress towards PT goals: Progressing toward goals    Frequency    Min 2X/week       AM-PAC PT "6 Clicks" Mobility   Outcome Measure  Help needed turning from your back to your side while in a flat bed without using bedrails?: None Help needed moving from lying on your back to sitting on the side of a flat bed without using bedrails?: None Help needed moving to and from a bed to a chair (including a wheelchair)?: None Help needed standing up from a chair using your arms (e.g., wheelchair or bedside chair)?: None Help needed to walk in hospital room?: A Little Help needed climbing 3-5 steps with a railing? : A Little 6 Click Score: 22    End of Session Equipment Utilized During Treatment: Gait belt Activity Tolerance: Patient tolerated treatment well Patient left: in chair;with call bell/phone within reach;with family/visitor present Nurse Communication: Mobility status PT Visit Diagnosis: Difficulty in walking, not elsewhere classified (R26.2);Other abnormalities of gait and mobility (R26.89);Repeated falls (R29.6)     Time: 1610-9604 PT Time Calculation (min) (ACUTE ONLY): 12 min  Charges:    $Gait Training: 8-22 mins $Neuromuscular Re-education: 8-22 mins PT General Charges $$ ACUTE PT VISIT: 1 Visit                    Jetta Lout PTA 01/07/24, 11:24 AM

## 2024-01-07 NOTE — Evaluation (Signed)
 Physical Therapy Evaluation Patient Details Name: Lindsey Barnes MRN: [id number] DOB: March 31, 1933 Today's Date: 01/07/2024  History of Present Illness  89 y.o. female with medical history significant of TIA, chronic back pain, hypertension, GERD, hard of hearing presenting with fall, syncopal event.  Patient reports syncopal event in the washroom.  Patient reports mild generalized dizziness prior to event.  Some reports of vertigo although patient currently denies.  No chest pain or shortness of breath.  Episode happened around 5 or 6 AM today.  Denies any chest pain prior to event.  No abdominal pain.  No nausea or vomiting.  Patient was unable to get up from the floor.  Life alert button was pressed.  No reported dysuria or increased urinary frequency.  Clinical Impression  Pt in bed, awake, pleasant and motivated to get out of bed, move around some. Pt AMB some without device, veyr much more impaired and anxious than her typical performance PTA. Pt does better with single railing or SPC, however, SPC is not a sufficient device for independent safe mobility. It's not clear that pt's awareness of imbalance during gait with cane is adequate for unsupervised walking. Family friend at bedside, we discuss recommendation for 24/7 supervision upon return to home. Anticipate much safer mobility with RW, will try next date.       If plan is discharge home, recommend the following: A little help with walking and/or transfers;A little help with bathing/dressing/bathroom   Can travel by private vehicle        Equipment Recommendations Rolling walker (2 wheels)  Recommendations for Other Services       Functional Status Assessment Patient has had a recent decline in their functional status and demonstrates the ability to make significant improvements in function in a reasonable and predictable amount of time.     Precautions / Restrictions Precautions Precautions: Fall      Mobility  Bed  Mobility Overal bed mobility: Modified Independent Bed Mobility: Supine to Sit, Sit to Supine                Transfers Overall transfer level: Needs assistance Equipment used: Rolling walker (2 wheels) Transfers: Sit to/from Stand Sit to Stand: Contact guard assist                Ambulation/Gait Ambulation/Gait assistance: Contact guard assist, Min assist Gait Distance (Feet): 200 Feet Assistive device: Straight cane, None         General Gait Details: AMB to PT gym without device, balance much worse than baseline, pt feels very nervous, but better with 1 railing, and better on return with cane  Stairs            Wheelchair Mobility     Tilt Bed    Modified Rankin (Stroke Patients Only)       Balance                                             Pertinent Vitals/Pain Pain Assessment Pain Assessment: No/denies pain    Home Living Family/patient expects to be discharged to:: Private residence Living Arrangements: Alone Available Help at Discharge: Family;Available 24 hours/day Type of Home: House Home Access: Stairs to enter Entrance Stairs-Rails: Right Entrance Stairs-Number of Steps: 3 stairs garage, 1 railing   Home Layout: One level Home Equipment: Rollator (4 wheels);Cane - single point;Shower seat;Grab bars - toilet;Grab  bars - tub/shower      Prior Function Prior Level of Function : Needs assist;Independent/Modified Independent;Driving;History of Falls (last six months)             Mobility Comments: SPC for out of house, rollator for big trips in community ADLs Comments: mod I for self care tasks with daughter assisting with IADL     Extremity/Trunk Assessment                Communication        Cognition Arousal: Alert Behavior During Therapy: WFL for tasks assessed/performed                             Following commands: Intact       Cueing Cueing Techniques: Verbal cues,  Gestural cues, Tactile cues, Visual cues     General Comments      Exercises     Assessment/Plan    PT Assessment Patient needs continued PT services  PT Problem List Decreased strength;Decreased range of motion;Decreased activity tolerance;Decreased balance;Decreased mobility;Decreased coordination;Decreased cognition;Decreased knowledge of use of DME       PT Treatment Interventions DME instruction;Functional mobility training;Gait training;Therapeutic activities;Therapeutic exercise    PT Goals (Current goals can be found in the Care Plan section)  Acute Rehab PT Goals Patient Stated Goal: return to home PT Goal Formulation: With patient Time For Goal Achievement: 01/21/24 Potential to Achieve Goals: Good    Frequency Min 2X/week     Co-evaluation               AM-PAC PT "6 Clicks" Mobility  Outcome Measure Help needed turning from your back to your side while in a flat bed without using bedrails?: None Help needed moving from lying on your back to sitting on the side of a flat bed without using bedrails?: None Help needed moving to and from a bed to a chair (including a wheelchair)?: None Help needed standing up from a chair using your arms (e.g., wheelchair or bedside chair)?: None Help needed to walk in hospital room?: A Little Help needed climbing 3-5 steps with a railing? : A Little 6 Click Score: 22    End of Session Equipment Utilized During Treatment: Gait belt Activity Tolerance: Patient tolerated treatment well;Patient limited by fatigue Patient left: in bed;with call bell/phone within reach;with family/visitor present Nurse Communication: Mobility status PT Visit Diagnosis: Difficulty in walking, not elsewhere classified (R26.2);Other abnormalities of gait and mobility (R26.89);Repeated falls (R29.6)    Time: 1610-9604 PT Time Calculation (min) (ACUTE ONLY): 25 min   Charges:   PT Evaluation $PT Eval Moderate Complexity: 1 Mod PT  Treatments $Neuromuscular Re-education: 8-22 mins PT General Charges $$ ACUTE PT VISIT: 1 Visit        10:42 AM, 01/07/24 Rosamaria Lints, PT, DPT Physical Therapist - [facility]  [phone] (ASCOM)    Dreon Pineda C 01/07/2024, 10:37 AM

## 2024-01-07 NOTE — Progress Notes (Signed)
 Eeg done

## 2024-01-07 NOTE — Procedures (Signed)
 Routine EEG Report  Lindsey Barnes is a 89 y.o. female with a history of syncope who is undergoing an EEG to evaluate for seizures.  Report: This EEG was acquired with electrodes placed according to the International 10-20 electrode system (including Fp1, Fp2, F3, F4, C3, C4, P3, P4, O1, O2, T3, T4, T5, T6, A1, A2, Fz, Cz, Pz). The following electrodes were missing or displaced: none.  The occipital dominant rhythm was 8.5 Hz. This activity is reactive to stimulation. Drowsiness was manifested by background fragmentation; deeper stages of sleep were identified by K complexes and sleep spindles. There was no focal slowing. There were no interictal epileptiform discharges. There were no electrographic seizures identified.   Impression: This EEG was obtained while awake and asleep and is normal.    Clinical Correlation: Normal EEGs, however, do not rule out epilepsy.  Bing Neighbors, MD [facility] [phone]  If 7pm- 7am, please page neurology on call as listed in AMION.

## 2024-01-07 NOTE — Consult Note (Signed)
 NEUROLOGY CONSULT NOTE   Date of service: January 07, 2024 Patient Name: Lindsey Barnes MRN:  [id number] DOB:  1933-08-10 Chief Complaint: loss of consciousness Requesting Provider: Jonah Blue, MD  History of Present Illness  Lindsey Barnes is a 89 y.o. female with hx of TIA, hypertension, chronic back pain, GERD, severely hard of hearing who presents with a fall with loss of consciousness.  Daughter at bedside says she spoke to patient while she was about to go to the restroom and that her life alert was pressed approximately 3 hours later.  Patient does not recall what happened preceding the event.  She had some dizziness but this is recently chronic vertigo.  She was unable to get up from the floor and was confused about how to press her life alert button but was able to do so and get assistance.  She has never had an episode like this before.  She was incontinent of urine during the episode but did not bite her tongue.  She is now back to baseline other than dysarthria which is mildly worse than recent baseline.  MRI brain without contrast showed a punctate focus of acute infarct involving the cortex of the posterior left frontal lobe.  She has multiple meningiomas over the right frontal lobe and anterior falx which are stable compared to prior scan.  TTE showed no significant abnormalities other than grade 1 diastolic dysfunction.  No intracardiac clot was observed.  LDL is 86. She is f/b Dr. Sherryll Burger in neurology outpatient clinic for a history of prior stroke and vertigo secondary to BPPV.  EEG performed today was normal with no epileptiform abnormalities.  NIHSS components Score: Comment  1a Level of Conscious 0[]  1[]  2[]  3[]      1b LOC Questions 0[]  1[]  2[]       1c LOC Commands 0[]  1[]  2[]       2 Best Gaze 0[]  1[]  2[]       3 Visual 0[]  1[]  2[]  3[]      4 Facial Palsy 0[]  1[]  2[]  3[]      5a Motor Arm - left 0[]  1[]  2[]  3[]  4[]  UN[]    5b Motor Arm - Right 0[]  1[]  2[]  3[]  4[]   UN[]    6a Motor Leg - Left 0[]  1[]  2[]  3[]  4[]  UN[]    6b Motor Leg - Right 0[]  1[]  2[]  3[]  4[]  UN[]    7 Limb Ataxia 0[]  1[]  2[]  3[]  UN[]     8 Sensory 0[]  1[]  2[]  UN[]      9 Best Language 0[]  1[]  2[]  3[]      10 Dysarthria 0[]  1[x]  2[]  UN[]      11 Extinct. and Inattention 0[]  1[]  2[]       TOTAL:  1      ROS  Comprehensive ROS performed and pertinent positives documented in HPI   Past History   Past Medical History:  Diagnosis Date   Anxiety    Breast cancer (HCC)    1990   Complication of anesthesia    Depression    Dyspnea    DOE   GERD (gastroesophageal reflux disease)    Headache(784.0)    HOH (hard of hearing)    Hypertension    Lymphoma (HCC)    Pain    CHRONIC BACK   Pain    CHRONIC BACK   Pneumonia    PONV (postoperative nausea and vomiting)    Tremors of nervous system    HANDS OCCAS    Past Surgical History:  Procedure Laterality Date  APPENDECTOMY     BACK SURGERY     BREAST SURGERY     CATARACT EXTRACTION W/PHACO Left 01/26/2017   Procedure: CATARACT EXTRACTION PHACO AND INTRAOCULAR LENS PLACEMENT (IOC);  Surgeon: Galen Manila, MD;  Location: [facility] ORS;  Service: Ophthalmology;  Laterality: Left;  [place] 53.7 AP% 20.3 CDE 10.90 Fluid pack lot # [id number]   CATARACT EXTRACTION W/PHACO Right 02/16/2017   Procedure: CATARACT EXTRACTION PHACO AND INTRAOCULAR LENS PLACEMENT (IOC);  Surgeon: Galen Manila, MD;  Location: [facility] ORS;  Service: Ophthalmology;  Laterality: Right;  [place] 00:55 AP% 22.5 CDE 12.43 Fluid pack lot # [id number]   CESAREAN SECTION     CHOLECYSTECTOMY N/A 03/20/2018   Procedure: LAPAROSCOPIC CHOLECYSTECTOMY;  Surgeon: Carolan Shiver, MD;  Location: [facility] ORS;  Service: General;  Laterality: N/A;   MASTECTOMY Right 1992   TUBAL LIGATION      Family History: History reviewed. No pertinent family history.  Social History  reports that she has never smoked. She has never used smokeless tobacco. She reports current drug use. Drugs:  Hydrocodone and Benzodiazepines. She reports that she does not drink alcohol.  Allergies  Allergen Reactions   Amoxicillin Swelling    Blisters.    Aspirin Other (See Comments)    Stomach ulcers.   Benadryl [Diphenhydramine Hcl] Other (See Comments)    Hyperactive.    Medications   Current Facility-Administered Medications:    acetaminophen (TYLENOL) tablet 650 mg, 650 mg, Oral, Q6H PRN, Floydene Flock, MD, 650 mg at 01/06/24 1333   atorvastatin (LIPITOR) tablet 40 mg, 40 mg, Oral, Daily, Jefferson Fuel, MD   clopidogrel (PLAVIX) tablet 75 mg, 75 mg, Oral, Daily, Floydene Flock, MD, 75 mg at 01/07/24 0804   diazepam (VALIUM) tablet 2 mg, 2 mg, Oral, Q6H PRN, Jonah Blue, MD, 2 mg at 01/07/24 9629   enoxaparin (LOVENOX) injection 40 mg, 40 mg, Subcutaneous, Q24H, Floydene Flock, MD, 40 mg at 01/07/24 0804   fluticasone (FLONASE) 50 MCG/ACT nasal spray 2 spray, 2 spray, Each Nare, Daily, Jonah Blue, MD   HYDROcodone-acetaminophen (NORCO/VICODIN) 5-325 MG per tablet 1 tablet, 1 tablet, Oral, Q8H PRN, Floydene Flock, MD, 1 tablet at 01/06/24 2213   HYDROmorphone (DILAUDID) injection 0.5 mg, 0.5 mg, Intravenous, Q4H PRN, Floydene Flock, MD, 0.5 mg at 01/05/24 1725   meclizine (ANTIVERT) tablet 25 mg, 25 mg, Oral, TID PRN, Floydene Flock, MD   sertraline (ZOLOFT) tablet 50 mg, 50 mg, Oral, BID, Jonah Blue, MD, 50 mg at 01/07/24 0804   sodium chloride flush (NS) 0.9 % injection 3 mL, 3 mL, Intravenous, Q12H, Floydene Flock, MD, 3 mL at 01/07/24 0805   traZODone (DESYREL) tablet 50 mg, 50 mg, Oral, QHS, Jonah Blue, MD, 50 mg at 01/06/24 2213  Vitals   Vitals:   01/07/24 0434 01/07/24 0500 01/07/24 0827 01/07/24 1209  BP: (!) 152/69  127/71 (!) 145/77  Pulse: 89  91 96  Resp: 20  18 18   Temp: 97.9 F (36.6 C)  98.1 F (36.7 C) 98.8 F (37.1 C)  TempSrc: Oral     SpO2: 96%  99% 91%  Weight:  88.3 kg    Height:        Body mass index is 30.49  kg/m.  Physical Exam   Gen: patient lying in bed, NAD CV: extremities appear well-perfused Resp: normal WOB  Neurologic Examination   MS: alert, oriented x4, follows commands Speech: mild dysarthria, no aphasia CN: PERRL, VFF, EOMI, sensation  intact, face symmetric, hearing extremely impaired, hearing aids not working, although she is able to hear most things if spoken very loudly Motor: 5/5 strength throughout Sensory: SILT Reflexes: 2+ symm with toes down bilat Coordination: FNF intact bilat Gait: deferred  Labs/Imaging/Neurodiagnostic studies   CBC:  Recent Labs  Lab 2024/02/03 0041 01/06/24 0436 01/07/24 0439  WBC 6.7 4.7 4.9  NEUTROABS 5.1  --  3.4  HGB 13.0 11.8* 12.1  HCT 39.3 36.7 36.5  MCV 94.5 95.1 93.4  PLT 149* 144* 151   Basic Metabolic Panel:  Lab Results  Component Value Date   NA 140 01/07/2024   K 3.3 (L) 01/07/2024   CO2 24 01/07/2024   GLUCOSE 114 (H) 01/07/2024   BUN 14 01/07/2024   CREATININE 0.62 01/07/2024   CALCIUM 8.2 (L) 01/07/2024   GFRNONAA >60 01/07/2024   GFRAA >60 03/21/2018   Lipid Panel:  Lab Results  Component Value Date   LDLCALC 86 01/06/2024   HgbA1c:  Lab Results  Component Value Date   HGBA1C 5.3 Feb 03, 2024   Urine Drug Screen: No results found for: "LABOPIA", "COCAINSCRNUR", "LABBENZ", "AMPHETMU", "THCU", "LABBARB"  Alcohol Level No results found for: "ETH" INR  Lab Results  Component Value Date   INR 1.09 01/13/2012   APTT No results found for: "APTT" AED levels: No results found for: "PHENYTOIN", "ZONISAMIDE", "LAMOTRIGINE", "LEVETIRACETA"  CT angio Head and Neck with contrast(Personally reviewed): 1. No emergent large vessel occlusion or proximal hemodynamically significant stenosis. 2. Known acute infarct and meningiomas better characterized on recent MRI.  MRI Brain(Personally reviewed):   Punctate focus of acute infarct involving the cortex of the posterior left frontal lobe.   Similar  appearance of meningiomas over the right frontal lobe and anterior falx.   No midline shift.   Moderate chronic microvascular ischemic changes. 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  Neurodiagnostics rEEG:  Normal  TTE Left ventricular ejection fraction, by estimation, is 60 to 65% . The left ventricle has normal function. The left ventricle has no regional wall motion abnormalities. Left ventricular diastolic parameters are consistent with Grade I diastolic dysfunction ( impaired relaxation) . 2. Right ventricular systolic function is normal. The right ventricular size is normal. There is normal pulmonary artery systolic pressure. The estimated right ventricular systolic pressure is 29. 0 mmHg. 3. The mitral valve is normal in structure. Moderate mitral valve regurgitation. No evidence of mitral stenosis. 4. The aortic valve is normal in structure. Aortic valve regurgitation is not visualized. No aortic stenosis is present. 5. The inferior vena cava is normal in size with greater than 50% respiratory variability, suggesting right atrial pressure of 3 mmHg.  Stroke Labs     Component Value Date/Time   CHOL 146 01/06/2024 0436   TRIG 89 01/06/2024 0436   HDL 42 01/06/2024 0436   CHOLHDL 3.5 01/06/2024 0436   VLDL 18 01/06/2024 0436   LDLCALC 86 01/06/2024 0436    Lab Results  Component Value Date/Time   HGBA1C 5.3 02-03-2024 05:55 PM     ASSESSMENT   Lindsey Barnes is a 89 y.o. female with hx of TIA, hypertension, chronic back pain, GERD, severely hard of hearing who presents with a fall with loss of consciousness. There are several features of this event that warrant consideration of seizure particularly probably long duration of LOC/AMS (likely 3 hrs per daughter) and incontinence. Her biggest risk factor for seizure is meningioma (the acute stroke on MRI yesterday and  her prior stroke were both extremely small). Given her normal EEG  today I am not going to start her on seizure medication after a single unwitnessed event however if she has another similar episode it would be reasonable to consider starting an AED. She is asymptomatic from her current stroke other than a slight worsening of her baseline mild dysarthria. Stroke workup is now completed.  RECOMMENDATIONS   - Continue plavix indefinitely (allergic to ASA) - D/c simvastatin, start atorvastatin 40mg  daily (LDL slightly above goal at 86) - F/u with Dr. Cristopher Peru her established outpatient neurologist - If she has another similar event would be reasonable to consider starting an AED but will hold off at this time - Neurology to sign off, feel free to re-engage if additional neurologic concerns arise ______________________________________________________________________    Signed, Jefferson Fuel, MD [facility]

## 2024-01-07 NOTE — TOC Progression Note (Addendum)
 Transition of Care [facility]) - Progression Note    Patient Details  Name: Lindsey Barnes MRN: [id number] Date of Birth: 01/20/1933  Transition of Care [facility]) CM/SW Contact  Cherre Blanc, RN Phone Number: 01/07/2024, 3:36 PM  Clinical Narrative:     Called referral to Kandee Keen [phone] with [facility] for [facility] PT/OT. TOC will continue to follow for dc planning.  Expected Discharge Plan: Home w Home Health Services Barriers to Discharge: Continued Medical Work up  Expected Discharge Plan and Services   Discharge Planning Services: CM Consult   Living arrangements for the past 2 months: Single Family Home                                       Social Determinants of Health (SDOH) Interventions SDOH Screenings   Food Insecurity: No Food Insecurity (01/05/2024)  Housing: Low Risk  (01/05/2024)  Transportation Needs: No Transportation Needs (01/05/2024)  Utilities: Not At Risk (01/05/2024)  Financial Resource Strain: Patient Declined (04/30/2023)   Received from [facility]  Social Connections: Moderately Isolated (01/05/2024)  Tobacco Use: Low Risk  (01/05/2024)    Readmission Risk Interventions     No data to display

## 2024-01-07 NOTE — Progress Notes (Addendum)
 Progress Note   Patient: Lindsey Barnes ZOX:[id number] DOB: [birth year] DOA: 01/04/2024     1 DOS: the patient was seen and examined on 01/07/2024   Brief hospital course: 89yo with h/o TIA, chronic back pain, and HTN who presented on 3/25 with a fall and syncope.  MRI with punctate acute infarct of posterior L frontal lobe cortex, neurology consulted.  Concern for UTI, started on ceftriaxone.    Assessment and Plan:  Syncope Possible syncopal event at home with noted mild generalized dizziness/weakness prior to event Noted to have associated encephalopathy in the ER, although this has abated Grossly nonfocal exam on evaluation CT head, chest x-ray grossly stable MRI with punctate acute infract of posterior L frontal cortex Resume meclizine in setting of history of vertigo  Neurology has consulted and ordered EEG (given h/o meningiomas); this was negative and so AEDs are not currently indicated PT/OT think she is appropriate for dc to home today but family is uncomfortable and requests one more night with PT in the AM to ensure that she is able to climb steps Will need outpatient neurology f/u   Acute CVA Noted remote history of TIA in the past MRI with punctate acute infract of posterior L frontal cortex Nonfocal neuroexam Echo unremarkable PT/OT consulted yesterday (3/27) and recommended ongoing hospitalization overnight and then home PT/OT today, as she is more stable Continue Plavix; may need DAPT but will defer to neurology Neurology consulted   Possible UTI Urinalysis with small LE, rare bacteria Empirically covered w/ IV rocephin in the ER  Hold further antibiotics at this time   Chronic diastolic CHF Echo on 3/26 with preserved EF and grade 1 diastolic dysfunction Appears compensated This was also present on 01/2021 echo   Esophageal reflux PPI   Hyperlipidemia Continue statin pending CK level   HTN (hypertension) Controlled without medications at this  time Hold hydrochlorothiazide and propranolol    Compression fracture of T12 vertebra (HCC) Noted T12 compression fracture on imaging-chronic versus acute on chronic issue Minimal back pain at present Consult neurosurgery if needed PT OT evaluation   Class 1 Obesity Body mass index is 30.21 kg/m.Marland Kitchen  Weight loss should be encouraged Outpatient PCP/bariatric medicine f/u encouraged Significantly low or high BMI is associated with higher medical risk including morbidity and mortality          Consultants: Neurology PT OT   Procedures: Echocardiogram 3/26   Antibiotics: Ceftriaxone x 1 dose  30 Day Unplanned Readmission Risk Score    Flowsheet Row ED to Hosp-Admission (Current) from 01/04/2024 in [facility] 1C MEDICAL TELEMETRY  30 Day Unplanned Readmission Risk Score (%) 11.14 Filed at 01/07/2024 1200       This score is the patient's risk of an unplanned readmission within 30 days of being discharged (0 -100%). The score is based on dignosis, age, lab data, medications, orders, and past utilization.   Low:  0-14.9   Medium: 15-21.9   High: 22-29.9   Extreme: 30 and above           Subjective: Feeling better, less confused, still having some hearing impairment vs. Baseline, remains mildly unsteady.   Her daughter is very concerned that she is not yet ready for her.  She is having a handicapped ramp installed today and would feel safer with her mother leaving tomorrow after she has practiced stairs.   Objective: Vitals:   01/07/24 0827 01/07/24 1209  BP: 127/71 (!) 145/77  Pulse: 91 96  Resp:  18 18  Temp: 98.1 F (36.7 C) 98.8 F (37.1 C)  SpO2: 99% 91%    Intake/Output Summary (Last 24 hours) at 01/07/2024 1457 Last data filed at 01/07/2024 1336 Gross per 24 hour  Intake 100 ml  Output --  Net 100 ml   Filed Weights   01/04/24 2338 01/06/24 0500 01/07/24 0500  Weight: 86.6 kg 87.5 kg 88.3 kg    Exam:  General:  Appears calm  and comfortable and is in NAD, sitting up in bedside chair Eyes: EOMI, normal lids, iris ENT:  grossly normal hearing, lips & tongue, mmm Neck:  no LAD, masses or thyromegaly Cardiovascular:  RRR, no m/r/g. No LE edema.  Respiratory:   CTA bilaterally with no wheezes/rales/rhonchi.  Normal respiratory effort. Abdomen:  soft, NT, ND Skin:  no rash or induration seen on limited exam Musculoskeletal:  grossly normal tone BUE/BLE, good ROM, no bony abnormality Psychiatric:  grossly normal mood and affect, speech fluent and appropriate, AOx3 Neurologic:  CN 2-12 grossly intact, moves all extremities in coordinated fashion  Data Reviewed: I have reviewed the patient's lab results since admission.  Pertinent labs for today include:   K+ 3.3 Glucose 114 Normal CBC     Family Communication: I spoke with her daughter at the bedside and again later by telephone  Disposition: Status is: Inpatient Remains inpatient appropriate because: ongoing monitoring     Time spent: 50 minutes  Unresulted Labs (From admission, onward)     Start     Ordered   01/08/24 0500  CBC with Differential/Platelet  Tomorrow morning,   R        01/07/24 1455   01/08/24 0500  Basic metabolic panel with GFR  Tomorrow morning,   R        01/07/24 1455             Author: Jonah Blue, MD 01/07/2024 2:57 PM  For on call review [email or web address].

## 2024-01-07 NOTE — TOC Initial Note (Signed)
 Transition of Care [facility]) - Initial/Assessment Note    Patient Details  Name: Lindsey Barnes MRN: [id number] Date of Birth: 02/16/1933  Transition of Care [facility]) CM/SW Contact:    Cherre Blanc, RN Phone Number: 01/07/2024, 9:57 AM  Clinical Narrative:                 Patient is from home with support from her family. The plan is for the patient to dc to home with [facility] PT/OT. The patient's daughter advised that the patient had [facility] PT/OT several years ago but they can't remember which company. The daughter advised that the patient has several stairs to enter her home and is concerned she want be able to use them to enter after this hospitalization. The daughter is hoping to have a ramp built today or Saturday. TOC will continue to follow for dc planning.  Expected Discharge Plan: Home w Home Health Services Barriers to Discharge: Continued Medical Work up   Patient Goals and CMS Choice            Expected Discharge Plan and Services   Discharge Planning Services: CM Consult   Living arrangements for the past 2 months: Single Family Home                                      Prior Living Arrangements/Services Living arrangements for the past 2 months: Single Family Home Lives with:: Self                   Activities of Daily Living   ADL Screening (condition at time of admission) Independently performs ADLs?: No Does the patient have a NEW difficulty with bathing/dressing/toileting/self-feeding that is expected to last >3 days?: No Does the patient have a NEW difficulty with getting in/out of bed, walking, or climbing stairs that is expected to last >3 days?: Yes (Initiates electronic notice to provider for possible PT consult) Does the patient have a NEW difficulty with communication that is expected to last >3 days?: No Is the patient deaf or have difficulty hearing?: No Does the patient have difficulty seeing, even when wearing glasses/contacts?:  No Does the patient have difficulty concentrating, remembering, or making decisions?: No  Permission Sought/Granted                  Emotional Assessment       Orientation: : Oriented to Self   Psych Involvement: No (comment)  Admission diagnosis:  Syncope [R55] Fall, initial encounter [W19.XXXA] Syncope, unspecified syncope type [R55] Patient Active Problem List   Diagnosis Date Noted   Syncope 01/05/2024   Suspected UTI 01/05/2024   Acute ischemic left ACA stroke (HCC) 01/18/2021   Acute CVA (cerebrovascular accident) (HCC) 01/17/2021   Depression, controlled 04/01/2018   Esophageal reflux 04/01/2018   Senile osteoporosis 04/01/2018   Acute cholecystitis 03/20/2018   Insomnia, unspecified 12/11/2014   Compression fracture of T12 vertebra (HCC) 01/13/2012   HTN (hypertension) 01/13/2012   Hyperlipidemia 01/13/2012   Low back pain 01/13/2012   PCP:  Dorothey Baseman, MD Pharmacy:   [facility] [address] [place] [facility] [address] [place] [address] Phone: [phone] Fax: [phone]     Social Drivers of Health (SDOH) Social History: SDOH Screenings   Food Insecurity: No Food Insecurity (01/05/2024)  Housing: Low Risk  (01/05/2024)  Transportation Needs: No Transportation Needs (01/05/2024)  Utilities:  Not At Risk (01/05/2024)  Financial Resource Strain: Patient Declined (04/30/2023)   Received from [facility]  Social Connections: Moderately Isolated (01/05/2024)  Tobacco Use: Low Risk  (01/05/2024)   SDOH Interventions:     Readmission Risk Interventions     No data to display

## 2024-01-07 NOTE — Progress Notes (Signed)
 Mobility Specialist - Progress Note   01/07/24 1634  Mobility  Activity Ambulated with assistance to bathroom  Level of Assistance Standby assist, set-up cues, supervision of patient - no hands on  Assistive Device Front wheel walker  Distance Ambulated (ft) 40 ft  Activity Response Tolerated well  Mobility visit 1 Mobility  Mobility Specialist Start Time (ACUTE ONLY) 1553  Mobility Specialist Stop Time (ACUTE ONLY) 1601  Mobility Specialist Time Calculation (min) (ACUTE ONLY) 8 min   Pt seated EOB upon entry, requesting to use the bathroom. Pt STS to RW MinG and amb to/from the bathroom with supervision. Pt amb to the recliner, left seated with alarm set and needs within reach.  Zetta Bills Mobility Specialist 01/07/24 4:36 PM

## 2024-01-07 NOTE — Plan of Care (Signed)
  Problem: Education: Goal: Knowledge of condition and prescribed therapy will improve Outcome: Progressing   Problem: Physical Regulation: Goal: Complications related to the disease process, condition or treatment will be avoided or minimized Outcome: Progressing   Problem: Education: Goal: Knowledge of General Education information will improve Description: Including pain rating scale, medication(s)/side effects and non-pharmacologic comfort measures Outcome: Progressing   Problem: Clinical Measurements: Goal: Cardiovascular complication will be avoided Outcome: Progressing

## 2024-01-08 DIAGNOSIS — I639 Cerebral infarction, unspecified: Secondary | ICD-10-CM | POA: Diagnosis not present

## 2024-01-08 DIAGNOSIS — E66811 Obesity, class 1: Secondary | ICD-10-CM

## 2024-01-08 DIAGNOSIS — E6609 Other obesity due to excess calories: Secondary | ICD-10-CM

## 2024-01-08 LAB — CBC WITH DIFFERENTIAL/PLATELET
Abs Immature Granulocytes: 0.01 10*3/uL (ref 0.00–0.07)
Basophils Absolute: 0 10*3/uL (ref 0.0–0.1)
Basophils Relative: 0 %
Eosinophils Absolute: 0.1 10*3/uL (ref 0.0–0.5)
Eosinophils Relative: 4 %
HCT: 34 % — ABNORMAL LOW (ref 36.0–46.0)
Hemoglobin: 11.2 g/dL — ABNORMAL LOW (ref 12.0–15.0)
Immature Granulocytes: 0 %
Lymphocytes Relative: 19 %
Lymphs Abs: 0.7 10*3/uL (ref 0.7–4.0)
MCH: 30.7 pg (ref 26.0–34.0)
MCHC: 32.9 g/dL (ref 30.0–36.0)
MCV: 93.2 fL (ref 80.0–100.0)
Monocytes Absolute: 0.5 10*3/uL (ref 0.1–1.0)
Monocytes Relative: 13 %
Neutro Abs: 2.5 10*3/uL (ref 1.7–7.7)
Neutrophils Relative %: 64 %
Platelets: 139 10*3/uL — ABNORMAL LOW (ref 150–400)
RBC: 3.65 MIL/uL — ABNORMAL LOW (ref 3.87–5.11)
RDW: 14 % (ref 11.5–15.5)
WBC: 3.9 10*3/uL — ABNORMAL LOW (ref 4.0–10.5)
nRBC: 0 % (ref 0.0–0.2)

## 2024-01-08 LAB — BASIC METABOLIC PANEL WITH GFR
Anion gap: 6 (ref 5–15)
BUN: 15 mg/dL (ref 8–23)
CO2: 26 mmol/L (ref 22–32)
Calcium: 8.1 mg/dL — ABNORMAL LOW (ref 8.9–10.3)
Chloride: 108 mmol/L (ref 98–111)
Creatinine, Ser: 0.7 mg/dL (ref 0.44–1.00)
GFR, Estimated: >60 mL/min (ref >60)
Glucose, Bld: 100 mg/dL — ABNORMAL HIGH (ref 70–99)
Potassium: 4.2 mmol/L (ref 3.5–5.1)
Sodium: 140 mmol/L (ref 135–145)

## 2024-01-08 MED ORDER — ATORVASTATIN CALCIUM 40 MG PO TABS: 40.0000 mg | ORAL_TABLET | Freq: Every day | ORAL | 0 refills | Status: AC

## 2024-01-08 NOTE — TOC Transition Note (Signed)
 Transition of Care [facility]) - Discharge Note   Patient Details  Name: Lindsey Barnes MRN: [id number] Date of Birth: 07/07/1933  [facility]) CM/SW Contact:  Liliana Cline, LCSW Phone Number: 01/08/2024, 11:47 AM   Clinical Narrative:    Patient has orders to DC home today. Cory with [facility] notified.   Final next level of care: Home w Home Health Services Barriers to Discharge: Barriers Resolved   Patient Goals and CMS Choice            Discharge Placement                       Discharge Plan and Services Additional resources added to the After Visit Summary for     Discharge Planning Services: CM Consult                      HH Arranged: PT, OT [facility] Agency: [facility] Date [facility] Agency Contacted: 01/08/24   Representative spoke with at [facility] Agency: Kandee Keen  Social Drivers of Health (SDOH) Interventions SDOH Screenings   Food Insecurity: No Food Insecurity (01/05/2024)  Housing: Low Risk  (01/05/2024)  Transportation Needs: No Transportation Needs (01/05/2024)  Utilities: Not At Risk (01/05/2024)  Financial Resource Strain: Patient Declined (04/30/2023)   Received from [facility]  Social Connections: Moderately Isolated (01/05/2024)  Tobacco Use: Low Risk  (01/05/2024)     Readmission Risk Interventions     No data to display

## 2024-01-08 NOTE — Plan of Care (Signed)
  Problem: Cardiac: Goal: Will achieve and/or maintain adequate cardiac output Outcome: Progressing   Problem: Physical Regulation: Goal: Complications related to the disease process, condition or treatment will be avoided or minimized Outcome: Progressing   Problem: Clinical Measurements: Goal: Ability to maintain clinical measurements within normal limits will improve Outcome: Progressing Goal: Diagnostic test results will improve Outcome: Progressing

## 2024-01-08 NOTE — Progress Notes (Signed)
 Physical Therapy Treatment Patient Details Name: Lindsey Barnes MRN: [id number] DOB: 04/03/1933 Today's Date: 01/08/2024   History of Present Illness 89 y.o. female with medical history significant of TIA, chronic back pain, hypertension, GERD, hard of hearing presenting with fall, syncopal event.  Patient reports syncopal event in the washroom.  Patient reports mild generalized dizziness prior to event.  Some reports of vertigo although patient currently denies.  No chest pain or shortness of breath.  Episode happened around 5 or 6 AM today.  Denies any chest pain prior to event.  No abdominal pain.  No nausea or vomiting.  Patient was unable to get up from the floor.  Life alert button was pressed.  No reported dysuria or increased urinary frequency.    PT Comments  Pt was long sitting in bed upon arrival. She agrees to session and remained pleasant throughout. Safely demonstrated abilities to stand, ambulate and perform stairs. HHPT recommended at DC.    If plan is discharge home, recommend the following: A little help with walking and/or transfers;A little help with bathing/dressing/bathroom           Precautions / Restrictions Precautions Precautions: Fall Recall of Precautions/Restrictions: Intact Restrictions Weight Bearing Restrictions Per Provider Order: No     Mobility  Bed Mobility Overal bed mobility: Needs Assistance Bed Mobility: Supine to Sit  Supine to sit: Supervision   Transfers Overall transfer level: Needs assistance Equipment used: Rolling walker (2 wheels) Transfers: Sit to/from Stand Sit to Stand: Supervision   Ambulation/Gait Ambulation/Gait assistance: Supervision Gait Distance (Feet): 150 Feet Assistive device: Rolling walker (2 wheels) Gait Pattern/deviations: Step-through pattern  General Gait Details: no LOB or safety concerns during ambulation with use of RW.   Stairs Stairs: Yes Stairs assistance: Supervision Stair Management: Two  rails, Forwards Number of Stairs: 4 General stair comments: pt demonstrated safe abilities to ascend/descend stairs. pt had ramp and was completed yesterday   Balance Overall balance assessment: Needs assistance Sitting-balance support: Feet supported, Single extremity supported Sitting balance-Leahy Scale: Good     Standing balance support: Bilateral upper extremity supported, During functional activity, Reliant on assistive device for balance Standing balance-Leahy Scale: Good     Communication Communication Communication: No apparent difficulties  Cognition Arousal: Alert Behavior During Therapy: WFL for tasks assessed/performed   PT - Cognitive impairments: No apparent impairments      Following commands: Intact      Cueing Cueing Techniques: Verbal cues, Gestural cues, Tactile cues, Visual cues         Pertinent Vitals/Pain Pain Assessment Pain Assessment: 0-10 Pain Score: 4  Pain Location: low back Pain Descriptors / Indicators: Aching, Discomfort Pain Intervention(s): Limited activity within patient's tolerance, Monitored during session, Premedicated before session, Repositioned     PT Goals (current goals can now be found in the care plan section) Acute Rehab PT Goals Patient Stated Goal: return to home Progress towards PT goals: Progressing toward goals    Frequency    Min 2X/week       AM-PAC PT "6 Clicks" Mobility   Outcome Measure  Help needed turning from your back to your side while in a flat bed without using bedrails?: None Help needed moving from lying on your back to sitting on the side of a flat bed without using bedrails?: None Help needed moving to and from a bed to a chair (including a wheelchair)?: None Help needed standing up from a chair using your arms (e.g., wheelchair or bedside chair)?: None Help needed  to walk in hospital room?: A Little Help needed climbing 3-5 steps with a railing? : A Little 6 Click Score: 22    End of  Session Equipment Utilized During Treatment: Gait belt Activity Tolerance: Patient tolerated treatment well Patient left: in chair;with call bell/phone within reach;with family/visitor present Nurse Communication: Mobility status PT Visit Diagnosis: Difficulty in walking, not elsewhere classified (R26.2);Other abnormalities of gait and mobility (R26.89);Repeated falls (R29.6)     Time: 1040-1105 PT Time Calculation (min) (ACUTE ONLY): 25 min  Charges:    $Gait Training: 8-22 mins $Therapeutic Activity: 8-22 mins PT General Charges $$ ACUTE PT VISIT: 1 Visit                     Jetta Lout PTA 01/08/24, 12:15 PM

## 2024-01-08 NOTE — Discharge Summary (Signed)
 Physician Discharge Summary   Patient: Lindsey Barnes MRN: [id number] DOB: 29-Jan-1933  Admit date:     01/04/2024  Discharge date: 01/08/24  Discharge Physician: Jonah Blue   PCP: Dorothey Baseman, MD   Recommendations at discharge:   You are being discharged with home health physical and occupational therapy Continue Plavix indefinitely Change simvastatin to atorvastatin Follow up with Dr. Sherryll Burger (neurology) Follow up with Dr. Terance Hart in 1-2 weeks  Discharge Diagnoses: Principal Problem:   Acute CVA (cerebrovascular accident) [facility]) Active Problems:   Syncope   Compression fracture of T12 vertebra (HCC)   HTN (hypertension)   Hyperlipidemia   Esophageal reflux   Class 1 obesity due to excess calories with body mass index (BMI) of 30.0 to 30.9 in adult    Hospital Course: 89yo with h/o TIA, chronic back pain, and HTN who presented on 3/25 with a fall and syncope.  MRI with punctate acute infarct of posterior L frontal lobe cortex, neurology consulted.  Concern for UTI, started on ceftriaxone.    Assessment and Plan:   Acute CVA Noted remote history of TIA in the past MRI with punctate acute infract of posterior L frontal cortex Nonfocal neuroexam Echo unremarkable PT/OT consulted, stable for dc Continue Plavix indefinitely Neurology consulted  Syncope Possible syncopal event at home with noted mild generalized dizziness/weakness prior to event Noted to have associated encephalopathy in the ER, although this has abated Likely related to CVA, as above Resume meclizine in setting of history of vertigo  Neurology has consulted and ordered EEG (given h/o meningiomas); this was negative and so AEDs are not currently indicated Will need outpatient neurology f/u   Chronic diastolic CHF Echo on 3/26 with preserved EF and grade 1 diastolic dysfunction Appears compensated This was also present on 01/2021 echo   Esophageal reflux Continue PPI    Hyperlipidemia Continue statin but change simvastatin to atorvastatin for increased efficacy   HTN (hypertension) Resume hydrochlorothiazide and propranolol at this time   Compression fracture of T12 vertebra (HCC) Noted T12 compression fracture on imaging-chronic versus acute on chronic issue Minimal back pain at present Continue brace with ambulation PT OT evaluation   Class 1 Obesity Body mass index is 30.21 kg/m.Marland Kitchen  Weight loss should be encouraged Outpatient PCP/bariatric medicine f/u encouraged Significantly low or high BMI is associated with higher medical risk including morbidity and mortality   DNR DNR confirmed at the time of admission [facility] documents reviewed        Consultants: Neurology PT OT   Procedures: Echocardiogram 3/26   Antibiotics: Ceftriaxone x 1 dose  Pain control - [facility] Controlled Substance Reporting System database was reviewed. and patient was instructed, not to drive, operate heavy machinery, perform activities at heights, swimming or participation in water activities or provide baby-sitting services while on Pain, Sleep and Anxiety Medications; until their outpatient Physician has advised to do so again. Also recommended to not to take more than prescribed Pain, Sleep and Anxiety Medications.   Disposition: Home Diet recommendation:  Cardiac diet DISCHARGE MEDICATION: Allergies as of 01/08/2024       Reactions   Amoxicillin Swelling   Blisters.   Aspirin Other (See Comments)   Stomach ulcers.   Benadryl [diphenhydramine Hcl] Other (See Comments)   Hyperactive.        Medication List     STOP taking these medications    simvastatin 40 MG tablet Commonly known as: ZOCOR       TAKE these medications  atorvastatin 40 MG tablet Commonly known as: LIPITOR Take 1 tablet (40 mg total) by mouth daily. Start taking on: January 09, 2024   cholecalciferol 1000 units tablet Commonly known as: VITAMIN D Take 2,000  Units by mouth daily.   clopidogrel 75 MG tablet Commonly known as: PLAVIX Take 1 tablet (75 mg total) by mouth daily.   cyanocobalamin 500 MCG tablet Commonly known as: VITAMIN B12 Take 500 mcg by mouth daily.   diazepam 2 MG tablet Commonly known as: VALIUM Take 2 mg by mouth every 6 (six) hours as needed for anxiety.   docusate sodium 100 MG capsule Commonly known as: COLACE Take 100 mg by mouth daily as needed for mild constipation or moderate constipation.   fluticasone 50 MCG/ACT nasal spray Commonly known as: FLONASE Place 2 sprays into both nostrils daily.   hydrochlorothiazide 25 MG tablet Commonly known as: HYDRODIURIL Take 1 tablet by mouth daily.   Klor-Con M20 20 MEQ tablet Generic drug: potassium chloride SA Take 20 mEq by mouth in the morning and at bedtime.   meclizine 25 MG tablet Commonly known as: ANTIVERT Take 25 mg by mouth 3 (three) times daily as needed. For dizziness   propranolol 20 MG tablet Commonly known as: INDERAL Take 20 mg by mouth 2 (two) times daily.   scopolamine 1 MG/3DAYS Commonly known as: TRANSDERM-SCOP Place 1 patch onto the skin every 3 (three) days. As needed   sertraline 100 MG tablet Commonly known as: ZOLOFT Take 50 mg by mouth 2 (two) times daily.   traMADol 50 MG tablet Commonly known as: ULTRAM Take 25-50 mg by mouth every 6 (six) hours as needed for moderate pain (pain score 4-6).   traZODone 50 MG tablet Commonly known as: DESYREL Take 50-100 mg by mouth at bedtime.        Follow-up Information     Dorothey Baseman, MD Follow up.   Specialty: Family Medicine Why: Hospital follow up Contact information: [address] [place] [address] [phone]         [facility] Office Follow up.   Specialty: Home Health Services Why: The agency will call the patient to setup an appointment. Contact information: [address] [place]  [address] [phone]               Discharge Exam:   Subjective: Feeling good, eager to go home today.  Has been up and about without difficulty, wants to try stairs today.   Objective: Vitals:   01/08/24 0022 01/08/24 0410  BP: 125/62 125/68  Pulse: 80 85  Resp: 16 18  Temp: 98.1 F (36.7 C) 98.6 F (37 C)  SpO2: 95% 95%    Intake/Output Summary (Last 24 hours) at 01/08/2024 1124 Last data filed at 01/07/2024 1336 Gross per 24 hour  Intake 0 ml  Output --  Net 0 ml   Filed Weights   01/06/24 0500 01/07/24 0500 01/08/24 0500  Weight: 87.5 kg 88.3 kg 131.1 kg    Exam:  General:  Appears calm and comfortable and is in NAD Eyes: EOMI, normal lids, iris ENT:  grossly normal hearing, lips & tongue, mmm Neck:  no LAD, masses or thyromegaly Cardiovascular:  RRR, no m/r/g. No LE edema.  Respiratory:   CTA bilaterally with no wheezes/rales/rhonchi.  Normal respiratory effort. Abdomen:  soft, NT, ND Skin:  no rash or induration seen on limited exam Musculoskeletal:  grossly normal tone BUE/BLE, good ROM, no bony  abnormality Psychiatric:  grossly normal mood and affect, speech fluent and appropriate, AOx3 Neurologic:  CN 2-12 grossly intact, moves all extremities in coordinated fashion  Data Reviewed: I have reviewed the patient's lab results since admission.  Pertinent labs for today include:  Stable BMP Stable CBC    Condition at discharge: improving  The results of significant diagnostics from this hospitalization (including imaging, microbiology, ancillary and laboratory) are listed below for reference.   Imaging Studies: EEG adult Result Date: 01/07/2024 Jefferson Fuel, MD     01/07/2024  2:34 PM Routine EEG Report Lindsey Barnes is a 89 y.o. female with a history of syncope who is undergoing an EEG to evaluate for seizures. Report: This EEG was acquired with electrodes placed according to the International 10-20 electrode system (including Fp1, Fp2,  F3, F4, C3, C4, P3, P4, O1, O2, T3, T4, T5, T6, A1, A2, Fz, Cz, Pz). The following electrodes were missing or displaced: none. The occipital dominant rhythm was 8.5 Hz. This activity is reactive to stimulation. Drowsiness was manifested by background fragmentation; deeper stages of sleep were identified by K complexes and sleep spindles. There was no focal slowing. There were no interictal epileptiform discharges. There were no electrographic seizures identified. Impression: This EEG was obtained while awake and asleep and is normal.   Clinical Correlation: Normal EEGs, however, do not rule out epilepsy. Bing Neighbors, MD [facility] [phone] If 7pm- 7am, please page neurology on call as listed in AMION.   CT ANGIO HEAD NECK W WO CM Result Date: 01/06/2024 CLINICAL DATA:  acute neuro def EXAM: CT ANGIOGRAPHY HEAD AND NECK WITH AND WITHOUT CONTRAST TECHNIQUE: Multidetector CT imaging of the head and neck was performed using the standard protocol during bolus administration of intravenous contrast. Multiplanar CT image reconstructions and MIPs were obtained to evaluate the vascular anatomy. Carotid stenosis measurements (when applicable) are obtained utilizing NASCET criteria, using the distal internal carotid diameter as the denominator. RADIATION DOSE REDUCTION: This exam was performed according to the departmental dose-optimization program which includes automated exposure control, adjustment of the mA and/or kV according to patient size and/or use of iterative reconstruction technique. CONTRAST:  75mL OMNIPAQUE IOHEXOL 350 MG/ML SOLN COMPARISON:  MRI head January 05, 2024. FINDINGS: CT HEAD FINDINGS Brain: Known acute infarct and known meningiomas better characterized on recent MRI. No evidence of acute/interval large vascular territory infarct, acute hemorrhage, or midline shift. Vascular: See below. Skull: No acute fracture. Sinuses/Orbits: No acute finding. Other: No mastoid effusions. Review  of the MIP images confirms the above findings CTA NECK FINDINGS Aortic arch: Great vessel origins are patent without significant stenosis. Aortic atherosclerosis. Right carotid system: No evidence of dissection, stenosis (50% or greater), or occlusion. Left carotid system: No evidence of dissection, stenosis (50% or greater), or occlusion. Vertebral arteries: Codominant. No evidence of dissection, stenosis (50% or greater), or occlusion. Skeleton: No acute abnormality on limited assessment. Other neck: No acute abnormality on limited assessment. Upper chest: Visualized lung apices are clear. Review of the MIP images confirms the above findings CTA HEAD FINDINGS Anterior circulation: Bilateral intracranial ICAs, MCAs, and ACAs are patent without proximal hemodynamically significant stenosis. Posterior circulation: Bilateral intradural vertebral arteries, basilar artery and bilateral posterior shoulders are patent without proximal hemodynamically significant stenosis. Venous sinuses: As permitted by contrast timing, patent. Review of the MIP images confirms the above findings IMPRESSION: 1. No emergent large vessel occlusion or proximal hemodynamically significant stenosis. 2. Known acute infarct and meningiomas better characterized on recent  MRI. 3.  Aortic Atherosclerosis (ICD10-I70.0). Electronically Signed   By: Feliberto Harts M.D.   On: 01/06/2024 23:31   ECHOCARDIOGRAM COMPLETE Result Date: 01/05/2024    ECHOCARDIOGRAM REPORT   Patient Name:   Lindsey Barnes [facility] Date of Exam: 01/05/2024 Medical Rec #:  [id number]             Height:       67.0 in Accession #:    [id number]            Weight:       191.0 lb Date of Birth:  [birth year]            BSA:          1.983 m Patient Age:    89 years              BP:           107/72 mmHg Patient Gender: F                     HR:           71 bpm. Exam Location:  [facility] Procedure: 2D Echo, Cardiac Doppler, Color Doppler and Strain Analysis (Both            Spectral  and Color Flow Doppler were utilized during procedure). Indications:     Syncope  History:         Patient has prior history of Echocardiogram examinations, most                  recent 01/17/2021. Stroke and TIA, Signs/Symptoms:Syncope; Risk                  Factors:Hypertension.  Sonographer:     Mikki Harbor Referring Phys:  [id number] Francoise Schaumann [place] Diagnosing Phys: Julien Nordmann MD  Sonographer Comments: Global longitudinal strain was attempted. IMPRESSIONS  1. Left ventricular ejection fraction, by estimation, is 60 to 65%. The left ventricle has normal function. The left ventricle has no regional wall motion abnormalities. Left ventricular diastolic parameters are consistent with Grade I diastolic dysfunction (impaired relaxation).  2. Right ventricular systolic function is normal. The right ventricular size is normal. There is normal pulmonary artery systolic pressure. The estimated right ventricular systolic pressure is 29.0 mmHg.  3. The mitral valve is normal in structure. Moderate mitral valve regurgitation. No evidence of mitral stenosis.  4. The aortic valve is normal in structure. Aortic valve regurgitation is not visualized. No aortic stenosis is present.  5. The inferior vena cava is normal in size with greater than 50% respiratory variability, suggesting right atrial pressure of 3 mmHg. FINDINGS  Left Ventricle: Left ventricular ejection fraction, by estimation, is 60 to 65%. The left ventricle has normal function. The left ventricle has no regional wall motion abnormalities. Global longitudinal strain performed but not reported based on interpreter judgement due to suboptimal tracking. The left ventricular internal cavity size was normal in size. There is no left ventricular hypertrophy. Left ventricular diastolic parameters are consistent with Grade I diastolic dysfunction (impaired relaxation). Right Ventricle: The right ventricular size is normal. No increase in right ventricular wall thickness.  Right ventricular systolic function is normal. There is normal pulmonary artery systolic pressure. The tricuspid regurgitant velocity is 2.55 m/s, and  with an assumed right atrial pressure of 3 mmHg, the estimated right ventricular systolic pressure is 29.0 mmHg. Left Atrium: Left atrial size was normal in size. Right Atrium: Right atrial size was  normal in size. Pericardium: There is no evidence of pericardial effusion. Mitral Valve: The mitral valve is normal in structure. Moderate mitral valve regurgitation. No evidence of mitral valve stenosis. MV peak gradient, 3.4 mmHg. The mean mitral valve gradient is 2.0 mmHg. Tricuspid Valve: The tricuspid valve is normal in structure. Tricuspid valve regurgitation is mild . No evidence of tricuspid stenosis. Aortic Valve: The aortic valve is normal in structure. Aortic valve regurgitation is not visualized. No aortic stenosis is present. Aortic valve mean gradient measures 7.0 mmHg. Aortic valve peak gradient measures 13.4 mmHg. Aortic valve area, by VTI measures 1.88 cm. Pulmonic Valve: The pulmonic valve was normal in structure. Pulmonic valve regurgitation is not visualized. No evidence of pulmonic stenosis. Aorta: The aortic root is normal in size and structure. Venous: The inferior vena cava is normal in size with greater than 50% respiratory variability, suggesting right atrial pressure of 3 mmHg. IAS/Shunts: No atrial level shunt detected by color flow Doppler. Additional Comments: 3D was performed not requiring image post processing on an independent workstation and was indeterminate.  LEFT VENTRICLE PLAX 2D LVIDd:         4.20 cm   Diastology LVIDs:         2.90 cm   LV e' medial:    4.68 cm/s LV PW:         1.50 cm   LV E/e' medial:  16.7 LV IVS:        1.20 cm   LV e' lateral:   6.20 cm/s LVOT diam:     2.00 cm   LV E/e' lateral: 12.6 LV SV:         66 LV SV Index:   33        2D Longitudinal Strain LVOT Area:     3.14 cm  2D Strain GLS Avg:     -13.1 %   RIGHT VENTRICLE RV Basal diam:  3.55 cm RV Mid diam:    3.00 cm RV S prime:     11.90 cm/s TAPSE (M-mode): 2.1 cm LEFT ATRIUM             Index        RIGHT ATRIUM          Index LA diam:        4.10 cm 2.07 cm/m   RA Area:     9.65 cm LA Vol (A2C):   48.5 ml 24.46 ml/m  RA Volume:   26.81 ml 13.52 ml/m LA Vol (A4C):   60.6 ml 30.56 ml/m LA Biplane Vol: 58.8 ml 29.65 ml/m  AORTIC VALVE                     PULMONIC VALVE AV Area (Vmax):    2.03 cm      PV Vmax:       1.19 m/s AV Area (Vmean):   1.72 cm      PV Peak grad:  5.7 mmHg AV Area (VTI):     1.88 cm AV Vmax:           183.00 cm/s AV Vmean:          120.000 cm/s AV VTI:            0.353 m AV Peak Grad:      13.4 mmHg AV Mean Grad:      7.0 mmHg LVOT Vmax:         118.00 cm/s LVOT Vmean:  65.600 cm/s LVOT VTI:          0.211 m LVOT/AV VTI ratio: 0.60  AORTA Ao Root diam: 3.10 cm Ao Asc diam:  3.60 cm MITRAL VALVE               TRICUSPID VALVE MV Area (PHT): 2.47 cm    TR Peak grad:   26.0 mmHg MV Area VTI:   2.34 cm    TR Vmax:        255.00 cm/s MV Peak grad:  3.4 mmHg MV Mean grad:  2.0 mmHg    SHUNTS MV Vmax:       0.92 m/s    Systemic VTI:  0.21 m MV Vmean:      57.1 cm/s   Systemic Diam: 2.00 cm MV Decel Time: 307 msec MV E velocity: 78.00 cm/s MV A velocity: 98.50 cm/s MV E/A ratio:  0.79 Julien Nordmann MD Electronically signed by Julien Nordmann MD Signature Date/Time: 01/05/2024/5:43:46 PM    Final    MR BRAIN WO CONTRAST Result Date: 01/05/2024 CLINICAL DATA:  Mental status change, history of TIA, fall and syncopal event. EXAM: MRI HEAD WITHOUT CONTRAST TECHNIQUE: Multiplanar, multiecho pulse sequences of the brain and surrounding structures were obtained without intravenous contrast. COMPARISON:  Earlier same day CT head, MRI brain 01/21/2022 FINDINGS: Brain: Punctate focus of restricted diffusion in the cortex of the posterior left frontal lobe. No evidence of intracranial hemorrhage. Redemonstration of a 1.9 x 1.3 x 0.9 cm  extra-axial mass over the posterior right frontal lobe near the vertex, similar to prior when remeasured in a similar manner. Minimal mass effect on the adjacent parenchyma. No signal abnormality or edema within the adjacent parenchyma. Additional extra-axial mass along the anterior falx abutting the anterior parasagittal right frontal lobe measuring 0.8 x 0.7 x 0.8 cm, similar to prior. Scattered and confluent FLAIR signal abnormality in the periventricular and subcortical white matter. 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 No midline shift. Normal appearance of midline structures. The basilar cisterns are patent. No extra-axial fluid collections. Ventricles: Normal size and configuration of the ventricles. Vascular: Skull base flow voids are visualized. Skull and upper cervical spine: No focal abnormality. Sinuses/Orbits: Orbits are symmetric. Paranasal sinuses are clear. Other: Mastoid air cells are clear. IMPRESSION: Punctate focus of acute infarct involving the cortex of the posterior left frontal lobe. Similar appearance of meningiomas over the right frontal lobe and anterior falx. No midline shift. Moderate chronic microvascular ischemic changes. 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 Electronically Signed   By: Emily Filbert M.D.   On: 01/05/2024 10:51   CT Thoracic Spine Wo Contrast Addendum Date: 01/05/2024 ADDENDUM REPORT: 01/05/2024 00:57 ADDENDUM: Correlation with prior lumbar spine radiographs from 01/17/2021 show no change to the T12 compression fracture. No acute compression is indicated. Electronically Signed   By: Burman Nieves M.D.   On: 01/05/2024 00:57   Result Date: 01/05/2024 CLINICAL DATA:  Back trauma after a fall. EXAM: CT THORACIC SPINE WITHOUT CONTRAST TECHNIQUE: Multidetector CT images of the thoracic were obtained using the standard protocol without intravenous contrast. RADIATION DOSE REDUCTION: This  exam was performed according to the departmental dose-optimization program which includes automated exposure control, adjustment of the mA and/or kV according to patient size and/or use of iterative reconstruction technique. COMPARISON:  CT thoracolumbar junction 01/13/2012 FINDINGS: Alignment: Normal alignment of the thoracic spine. Vertebrae: Compression of the T12 vertebra demonstrates progression since  previous study. Acute progression is not excluded. There is about 80% loss of height centrally. No other vertebral compression deformities. Vertebral hemangioma at T8. No destructive or expansile bone lesions. Paraspinal and other soft tissues: No abnormal paraspinal soft tissue mass or infiltration. Calcification of the aorta. Small esophageal hiatal hernia. Disc levels: Degenerative changes with disc space narrowing and endplate osteophyte formation throughout. IMPRESSION: 1. Normal alignment of the thoracic spine. Progression of compression fracture at T12 since previous study from 2013. Acute progression is not excluded. 2. Degenerative changes throughout the thoracic spine. 3. Aortic atherosclerosis.  Small esophageal hiatal hernia. Electronically Signed: By: Burman Nieves M.D. On: 01/05/2024 00:50   CT Cervical Spine Wo Contrast Result Date: 01/05/2024 CLINICAL DATA:  Neck trauma after a fall. EXAM: CT CERVICAL SPINE WITHOUT CONTRAST TECHNIQUE: Multidetector CT imaging of the cervical spine was performed without intravenous contrast. Multiplanar CT image reconstructions were also generated. RADIATION DOSE REDUCTION: This exam was performed according to the departmental dose-optimization program which includes automated exposure control, adjustment of the mA and/or kV according to patient size and/or use of iterative reconstruction technique. COMPARISON:  12/24/2013 FINDINGS: Alignment: Normal alignment of the cervical spine and facet joints. Skull base and vertebrae: Skull base appears intact. No  vertebral compression deformities. No focal bone lesion or bone destruction. Soft tissues and spinal canal: No prevertebral soft tissue swelling. No abnormal paraspinal soft tissue mass or infiltration. Disc levels: Degenerative changes throughout the cervical spine with disc space narrowing and endplate osteophyte formation. Degenerative changes throughout the facet joints. Degenerative changes in the temporomandibular joints. Upper chest: Visualized lung apices are clear. Other: None. IMPRESSION: Normal alignment of the cervical vertebrae. No acute displaced fractures identified. Diffuse degenerative changes. Electronically Signed   By: Burman Nieves M.D.   On: 01/05/2024 00:56   CT Lumbar Spine Wo Contrast Result Date: 01/05/2024 CLINICAL DATA:  Back trauma due to a fall. EXAM: CT LUMBAR SPINE WITHOUT CONTRAST TECHNIQUE: Multidetector CT imaging of the lumbar spine was performed without intravenous contrast administration. Multiplanar CT image reconstructions were also generated. RADIATION DOSE REDUCTION: This exam was performed according to the departmental dose-optimization program which includes automated exposure control, adjustment of the mA and/or kV according to patient size and/or use of iterative reconstruction technique. COMPARISON:  Lumbar spine radiographs 01/17/2021. CT lumbar spine 01/13/2012 FINDINGS: Segmentation: 5 lumbar type vertebral bodies. Alignment: Normal alignment. Vertebrae: Chronic compression of the L2 vertebra post kyphoplasty. No significant progression. Mild retropulsion of fracture fragments at L2 is unchanged. No new compression deformities in the lumbar spine. Tarlov cysts in the sacrum. Paraspinal and other soft tissues: No abnormal paraspinal soft tissue mass or infiltration. Calcification of the aorta. Disc levels: Degenerative changes throughout with disc space narrowing and endplate osteophyte formation. Degenerative changes throughout the facet joints. IMPRESSION: 1.  Normal alignment of the lumbar spine. Old compression of L2 post kyphoplasty is unchanged. No acute bony abnormalities. Electronically Signed   By: Burman Nieves M.D.   On: 01/05/2024 00:53   CT Head Wo Contrast Result Date: 01/05/2024 CLINICAL DATA:  Minor head trauma. Patient fell on Plavix. Vertigo and dizziness this morning. EXAM: CT HEAD WITHOUT CONTRAST TECHNIQUE: Contiguous axial images were obtained from the base of the skull through the vertex without intravenous contrast. RADIATION DOSE REDUCTION: This exam was performed according to the departmental dose-optimization program which includes automated exposure control, adjustment of the mA and/or kV according to patient size and/or use of iterative reconstruction technique. COMPARISON:  MRI brain 01/21/2022.  CT head 01/17/2021 FINDINGS: Brain: Mild diffuse cerebral atrophy. Low-attenuation changes throughout the deep white matter most consistent with small vessel ischemic changes. No progression since prior study. No mass-effect or midline shift. No abnormal extra-axial fluid collections. Basal cisterns are not effaced. No acute intracranial hemorrhage. Vascular: No hyperdense vessel or unexpected calcification. Skull: Normal. Negative for fracture or focal lesion. Sinuses/Orbits: No acute finding. Other: None. IMPRESSION: No acute intracranial abnormalities. Chronic atrophy and small vessel ischemic changes similar to prior study. Electronically Signed   By: Burman Nieves M.D.   On: 01/05/2024 00:47   DG Ribs Bilateral W/Chest Result Date: 01/05/2024 CLINICAL DATA:  Rib pain. Patient fell. Vertigo with dizziness this morning. EXAM: BILATERAL RIBS AND CHEST - 4+ VIEW COMPARISON:  Chest radiograph 03/20/2018 FINDINGS: Heart size and pulmonary vascularity are normal. Lungs are clear. No pleural effusion or pneumothorax. Surgical clips over the right chest. Calcification of the aorta. Fracture of the right posterior fourth rib with callus  formation suggesting a healing fracture. No acute displaced fractures demonstrated in the right or left ribs. Soft tissues are unremarkable. Degenerative changes in the spine and shoulders. IMPRESSION: 1. No evidence of active pulmonary disease. 2. Healing fracture in the right fourth rib. 3. No acute displaced rib fractures identified bilaterally. Electronically Signed   By: Burman Nieves M.D.   On: 01/05/2024 00:30    Microbiology: Results for orders placed or performed during the hospital encounter of 01/17/21  Resp Panel by RT-PCR (Flu A&B, Covid) Nasopharyngeal Swab     Status: None   Collection Time: 01/17/21 12:33 PM   Specimen: Nasopharyngeal Swab; Nasopharyngeal(NP) swabs in vial transport medium  Result Value Ref Range Status   SARS Coronavirus 2 by RT PCR NEGATIVE NEGATIVE Final    Comment: (NOTE) SARS-CoV-2 target nucleic acids are NOT DETECTED.  The SARS-CoV-2 RNA is generally detectable in upper respiratory specimens during the acute phase of infection. The lowest concentration of SARS-CoV-2 viral copies this assay can detect is 138 copies/mL. A negative result does not preclude SARS-Cov-2 infection and should not be used as the sole basis for treatment or other patient management decisions. A negative result may occur with  improper specimen collection/handling, submission of specimen other than nasopharyngeal swab, presence of viral mutation(s) within the areas targeted by this assay, and inadequate number of viral copies(<138 copies/mL). A negative result must be combined with clinical observations, patient history, and epidemiological information. The expected result is Negative.  Fact Sheet for Patients:  BloggerCourse.com  Fact Sheet for Healthcare Providers:  SeriousBroker.it  This test is no t yet approved or cleared by the [place] FDA and  has been authorized for detection and/or diagnosis of SARS-CoV-2  by FDA under an Emergency Use Authorization (EUA). This EUA will remain  in effect (meaning this test can be used) for the duration of the COVID-19 declaration under Section 564(b)(1) of the Act, 21 U.S.C.section 360bbb-3(b)(1), unless the authorization is terminated  or revoked sooner.       Influenza A by PCR NEGATIVE NEGATIVE Final   Influenza B by PCR NEGATIVE NEGATIVE Final    Comment: (NOTE) The Xpert Xpress SARS-CoV-2/FLU/RSV plus assay is intended as an aid in the diagnosis of influenza from Nasopharyngeal swab specimens and should not be used as a sole basis for treatment. Nasal washings and aspirates are unacceptable for Xpert Xpress SARS-CoV-2/FLU/RSV testing.  Fact Sheet for Patients: BloggerCourse.com  Fact Sheet for Healthcare Providers: SeriousBroker.it  This test is not yet approved or cleared  by the [place] and has been authorized for detection and/or diagnosis of SARS-CoV-2 by FDA under an Emergency Use Authorization (EUA). This EUA will remain in effect (meaning this test can be used) for the duration of the COVID-19 declaration under Section 564(b)(1) of the Act, 21 U.S.C. section 360bbb-3(b)(1), unless the authorization is terminated or revoked.  Performed at [facility], [address] [place], [place] [address]     Labs: CBC: Recent Labs  Lab 01/05/24 0041 01/06/24 0436 01/07/24 0439 01/08/24 0514  WBC 6.7 4.7 4.9 3.9*  NEUTROABS 5.1  --  3.4 2.5  HGB 13.0 11.8* 12.1 11.2*  HCT 39.3 36.7 36.5 34.0*  MCV 94.5 95.1 93.4 93.2  PLT 149* 144* 151 139*   Basic Metabolic Panel: Recent Labs  Lab 01/05/24 0041 01/06/24 0436 01/07/24 0439 01/08/24 0358  NA 138 140 140 140  K 4.4 3.2* 3.3* 4.2  CL 108 108 108 108  CO2 25 25 24 26   GLUCOSE 112* 114* 114* 100*  BUN 20 20 14 15   CREATININE 0.82 0.75 0.62 0.70  CALCIUM 8.1* 8.1* 8.2* 8.1*   Liver Function Tests: Recent  Labs  Lab 01/06/24 0436  AST 16  ALT 14  ALKPHOS 47  BILITOT 1.0  PROT 5.7*  ALBUMIN 3.1*   CBG: Recent Labs  Lab 01/05/24 1238 01/06/24 2025 01/07/24 0431 01/07/24 0827  GLUCAP 104* 95 115* 134*    Discharge time spent: greater than 30 minutes.  Signed: Jonah Blue, MD [facility] 01/08/2024

## 2024-01-11 DIAGNOSIS — Z6830 Body mass index (BMI) 30.0-30.9, adult: Secondary | ICD-10-CM | POA: Diagnosis not present

## 2024-01-11 DIAGNOSIS — E66811 Obesity, class 1: Secondary | ICD-10-CM | POA: Diagnosis not present

## 2024-01-11 DIAGNOSIS — M6281 Muscle weakness (generalized): Secondary | ICD-10-CM | POA: Diagnosis not present

## 2024-01-11 DIAGNOSIS — I5032 Chronic diastolic (congestive) heart failure: Secondary | ICD-10-CM | POA: Diagnosis not present

## 2024-01-11 DIAGNOSIS — S2231XD Fracture of one rib, right side, subsequent encounter for fracture with routine healing: Secondary | ICD-10-CM | POA: Diagnosis not present

## 2024-01-11 DIAGNOSIS — I69398 Other sequelae of cerebral infarction: Secondary | ICD-10-CM | POA: Diagnosis not present

## 2024-01-11 DIAGNOSIS — S22088D Other fracture of T11-T12 vertebra, subsequent encounter for fracture with routine healing: Secondary | ICD-10-CM | POA: Diagnosis not present

## 2024-01-11 DIAGNOSIS — I110 Hypertensive heart disease with heart failure: Secondary | ICD-10-CM | POA: Diagnosis not present

## 2024-01-11 DIAGNOSIS — F419 Anxiety disorder, unspecified: Secondary | ICD-10-CM | POA: Diagnosis not present

## 2024-01-11 DIAGNOSIS — I700 Atherosclerosis of aorta: Secondary | ICD-10-CM | POA: Diagnosis not present

## 2024-01-11 DIAGNOSIS — Z7902 Long term (current) use of antithrombotics/antiplatelets: Secondary | ICD-10-CM | POA: Diagnosis not present

## 2024-01-11 DIAGNOSIS — Z8572 Personal history of non-Hodgkin lymphomas: Secondary | ICD-10-CM | POA: Diagnosis not present

## 2024-01-11 DIAGNOSIS — K219 Gastro-esophageal reflux disease without esophagitis: Secondary | ICD-10-CM | POA: Diagnosis not present

## 2024-01-11 DIAGNOSIS — F32A Depression, unspecified: Secondary | ICD-10-CM | POA: Diagnosis not present

## 2024-01-11 DIAGNOSIS — Z853 Personal history of malignant neoplasm of breast: Secondary | ICD-10-CM | POA: Diagnosis not present

## 2024-01-11 DIAGNOSIS — Z9011 Acquired absence of right breast and nipple: Secondary | ICD-10-CM | POA: Diagnosis not present

## 2024-01-11 DIAGNOSIS — Z79899 Other long term (current) drug therapy: Secondary | ICD-10-CM | POA: Diagnosis not present

## 2024-01-11 DIAGNOSIS — M47812 Spondylosis without myelopathy or radiculopathy, cervical region: Secondary | ICD-10-CM | POA: Diagnosis not present

## 2024-01-11 DIAGNOSIS — G8929 Other chronic pain: Secondary | ICD-10-CM | POA: Diagnosis not present

## 2024-01-11 DIAGNOSIS — E6609 Other obesity due to excess calories: Secondary | ICD-10-CM | POA: Diagnosis not present

## 2024-01-11 DIAGNOSIS — E785 Hyperlipidemia, unspecified: Secondary | ICD-10-CM | POA: Diagnosis not present

## 2024-01-11 DIAGNOSIS — K449 Diaphragmatic hernia without obstruction or gangrene: Secondary | ICD-10-CM | POA: Diagnosis not present

## 2024-01-11 DIAGNOSIS — I081 Rheumatic disorders of both mitral and tricuspid valves: Secondary | ICD-10-CM | POA: Diagnosis not present

## 2024-01-11 DIAGNOSIS — M47814 Spondylosis without myelopathy or radiculopathy, thoracic region: Secondary | ICD-10-CM | POA: Diagnosis not present

## 2024-01-19 ENCOUNTER — Inpatient Hospital Stay

## 2024-01-19 ENCOUNTER — Inpatient Hospital Stay
Admission: EM | Admit: 2024-01-19 | Discharge: 2024-01-27 | DRG: 092 | Disposition: A | Discharge status: Skilled Nursing Facility | Attending: Internal Medicine | Admitting: Internal Medicine

## 2024-01-19 ENCOUNTER — Emergency Department

## 2024-01-19 ENCOUNTER — Other Ambulatory Visit: Payer: Self-pay

## 2024-01-19 ENCOUNTER — Encounter: Payer: Self-pay | Admitting: Internal Medicine

## 2024-01-19 DIAGNOSIS — R0789 Other chest pain: Secondary | ICD-10-CM | POA: Diagnosis not present

## 2024-01-19 DIAGNOSIS — S22080D Wedge compression fracture of T11-T12 vertebra, subsequent encounter for fracture with routine healing: Secondary | ICD-10-CM | POA: Diagnosis not present

## 2024-01-19 DIAGNOSIS — R079 Chest pain, unspecified: Secondary | ICD-10-CM | POA: Diagnosis not present

## 2024-01-19 DIAGNOSIS — S3282XD Multiple fractures of pelvis without disruption of pelvic ring, subsequent encounter for fracture with routine healing: Secondary | ICD-10-CM | POA: Diagnosis not present

## 2024-01-19 DIAGNOSIS — E785 Hyperlipidemia, unspecified: Secondary | ICD-10-CM | POA: Diagnosis present

## 2024-01-19 DIAGNOSIS — M419 Scoliosis, unspecified: Secondary | ICD-10-CM | POA: Diagnosis not present

## 2024-01-19 DIAGNOSIS — I63512 Cerebral infarction due to unspecified occlusion or stenosis of left middle cerebral artery: Secondary | ICD-10-CM | POA: Diagnosis not present

## 2024-01-19 DIAGNOSIS — Z79899 Other long term (current) drug therapy: Secondary | ICD-10-CM

## 2024-01-19 DIAGNOSIS — Z8572 Personal history of non-Hodgkin lymphomas: Secondary | ICD-10-CM

## 2024-01-19 DIAGNOSIS — I5032 Chronic diastolic (congestive) heart failure: Secondary | ICD-10-CM | POA: Diagnosis not present

## 2024-01-19 DIAGNOSIS — Z8701 Personal history of pneumonia (recurrent): Secondary | ICD-10-CM | POA: Diagnosis not present

## 2024-01-19 DIAGNOSIS — G8929 Other chronic pain: Secondary | ICD-10-CM | POA: Diagnosis not present

## 2024-01-19 DIAGNOSIS — S32020G Wedge compression fracture of second lumbar vertebra, subsequent encounter for fracture with delayed healing: Secondary | ICD-10-CM | POA: Diagnosis not present

## 2024-01-19 DIAGNOSIS — Z9011 Acquired absence of right breast and nipple: Secondary | ICD-10-CM | POA: Diagnosis not present

## 2024-01-19 DIAGNOSIS — K219 Gastro-esophageal reflux disease without esophagitis: Secondary | ICD-10-CM | POA: Diagnosis not present

## 2024-01-19 DIAGNOSIS — S32110D Nondisplaced Zone I fracture of sacrum, subsequent encounter for fracture with routine healing: Secondary | ICD-10-CM | POA: Diagnosis not present

## 2024-01-19 DIAGNOSIS — Z602 Problems related to living alone: Secondary | ICD-10-CM | POA: Diagnosis present

## 2024-01-19 DIAGNOSIS — N390 Urinary tract infection, site not specified: Secondary | ICD-10-CM | POA: Diagnosis present

## 2024-01-19 DIAGNOSIS — R4189 Other symptoms and signs involving cognitive functions and awareness: Secondary | ICD-10-CM | POA: Diagnosis not present

## 2024-01-19 DIAGNOSIS — R262 Difficulty in walking, not elsewhere classified: Secondary | ICD-10-CM | POA: Diagnosis present

## 2024-01-19 DIAGNOSIS — E876 Hypokalemia: Secondary | ICD-10-CM | POA: Diagnosis not present

## 2024-01-19 DIAGNOSIS — R1312 Dysphagia, oropharyngeal phase: Secondary | ICD-10-CM | POA: Diagnosis not present

## 2024-01-19 DIAGNOSIS — E669 Obesity, unspecified: Secondary | ICD-10-CM | POA: Diagnosis present

## 2024-01-19 DIAGNOSIS — Z8673 Personal history of transient ischemic attack (TIA), and cerebral infarction without residual deficits: Secondary | ICD-10-CM | POA: Diagnosis not present

## 2024-01-19 DIAGNOSIS — R52 Pain, unspecified: Secondary | ICD-10-CM

## 2024-01-19 DIAGNOSIS — M47814 Spondylosis without myelopathy or radiculopathy, thoracic region: Secondary | ICD-10-CM | POA: Diagnosis not present

## 2024-01-19 DIAGNOSIS — I69391 Dysphagia following cerebral infarction: Secondary | ICD-10-CM | POA: Diagnosis not present

## 2024-01-19 DIAGNOSIS — Z6833 Body mass index (BMI) 33.0-33.9, adult: Secondary | ICD-10-CM | POA: Diagnosis not present

## 2024-01-19 DIAGNOSIS — I10 Essential (primary) hypertension: Secondary | ICD-10-CM | POA: Diagnosis not present

## 2024-01-19 DIAGNOSIS — I110 Hypertensive heart disease with heart failure: Secondary | ICD-10-CM | POA: Diagnosis present

## 2024-01-19 DIAGNOSIS — Z880 Allergy status to penicillin: Secondary | ICD-10-CM | POA: Diagnosis not present

## 2024-01-19 DIAGNOSIS — Z886 Allergy status to analgesic agent status: Secondary | ICD-10-CM | POA: Diagnosis not present

## 2024-01-19 DIAGNOSIS — R2689 Other abnormalities of gait and mobility: Secondary | ICD-10-CM | POA: Diagnosis not present

## 2024-01-19 DIAGNOSIS — R278 Other lack of coordination: Secondary | ICD-10-CM | POA: Diagnosis not present

## 2024-01-19 DIAGNOSIS — S32020A Wedge compression fracture of second lumbar vertebra, initial encounter for closed fracture: Secondary | ICD-10-CM

## 2024-01-19 DIAGNOSIS — M545 Low back pain, unspecified: Secondary | ICD-10-CM | POA: Diagnosis not present

## 2024-01-19 DIAGNOSIS — R410 Disorientation, unspecified: Secondary | ICD-10-CM | POA: Diagnosis not present

## 2024-01-19 DIAGNOSIS — R4789 Other speech disturbances: Secondary | ICD-10-CM | POA: Diagnosis not present

## 2024-01-19 DIAGNOSIS — Z888 Allergy status to other drugs, medicaments and biological substances status: Secondary | ICD-10-CM | POA: Diagnosis not present

## 2024-01-19 DIAGNOSIS — M4856XA Collapsed vertebra, not elsewhere classified, lumbar region, initial encounter for fracture: Secondary | ICD-10-CM | POA: Diagnosis not present

## 2024-01-19 DIAGNOSIS — M48061 Spinal stenosis, lumbar region without neurogenic claudication: Secondary | ICD-10-CM | POA: Diagnosis not present

## 2024-01-19 DIAGNOSIS — K1121 Acute sialoadenitis: Secondary | ICD-10-CM | POA: Diagnosis not present

## 2024-01-19 DIAGNOSIS — S3210XA Unspecified fracture of sacrum, initial encounter for closed fracture: Secondary | ICD-10-CM | POA: Diagnosis present

## 2024-01-19 DIAGNOSIS — Z9181 History of falling: Secondary | ICD-10-CM | POA: Diagnosis not present

## 2024-01-19 DIAGNOSIS — S3282XA Multiple fractures of pelvis without disruption of pelvic ring, initial encounter for closed fracture: Secondary | ICD-10-CM | POA: Diagnosis present

## 2024-01-19 DIAGNOSIS — R069 Unspecified abnormalities of breathing: Secondary | ICD-10-CM | POA: Diagnosis not present

## 2024-01-19 DIAGNOSIS — I69398 Other sequelae of cerebral infarction: Secondary | ICD-10-CM | POA: Diagnosis not present

## 2024-01-19 DIAGNOSIS — K449 Diaphragmatic hernia without obstruction or gangrene: Secondary | ICD-10-CM | POA: Diagnosis not present

## 2024-01-19 DIAGNOSIS — M6281 Muscle weakness (generalized): Secondary | ICD-10-CM | POA: Diagnosis not present

## 2024-01-19 DIAGNOSIS — R55 Syncope and collapse: Secondary | ICD-10-CM | POA: Diagnosis not present

## 2024-01-19 DIAGNOSIS — Z7902 Long term (current) use of antithrombotics/antiplatelets: Secondary | ICD-10-CM

## 2024-01-19 DIAGNOSIS — R0602 Shortness of breath: Secondary | ICD-10-CM | POA: Diagnosis not present

## 2024-01-19 DIAGNOSIS — Z7401 Bed confinement status: Secondary | ICD-10-CM | POA: Diagnosis not present

## 2024-01-19 DIAGNOSIS — Z9049 Acquired absence of other specified parts of digestive tract: Secondary | ICD-10-CM

## 2024-01-19 DIAGNOSIS — J918 Pleural effusion in other conditions classified elsewhere: Secondary | ICD-10-CM | POA: Diagnosis not present

## 2024-01-19 DIAGNOSIS — Z853 Personal history of malignant neoplasm of breast: Secondary | ICD-10-CM

## 2024-01-19 DIAGNOSIS — F32A Depression, unspecified: Secondary | ICD-10-CM | POA: Diagnosis not present

## 2024-01-19 DIAGNOSIS — R1311 Dysphagia, oral phase: Secondary | ICD-10-CM | POA: Diagnosis not present

## 2024-01-19 DIAGNOSIS — M549 Dorsalgia, unspecified: Secondary | ICD-10-CM | POA: Diagnosis not present

## 2024-01-19 DIAGNOSIS — W19XXXA Unspecified fall, initial encounter: Secondary | ICD-10-CM | POA: Diagnosis not present

## 2024-01-19 DIAGNOSIS — Z741 Need for assistance with personal care: Secondary | ICD-10-CM | POA: Diagnosis not present

## 2024-01-19 DIAGNOSIS — K559 Vascular disorder of intestine, unspecified: Secondary | ICD-10-CM | POA: Diagnosis not present

## 2024-01-19 DIAGNOSIS — D329 Benign neoplasm of meninges, unspecified: Secondary | ICD-10-CM | POA: Diagnosis not present

## 2024-01-19 DIAGNOSIS — S32020D Wedge compression fracture of second lumbar vertebra, subsequent encounter for fracture with routine healing: Secondary | ICD-10-CM | POA: Diagnosis not present

## 2024-01-19 DIAGNOSIS — R1084 Generalized abdominal pain: Secondary | ICD-10-CM | POA: Diagnosis not present

## 2024-01-19 DIAGNOSIS — I701 Atherosclerosis of renal artery: Secondary | ICD-10-CM | POA: Diagnosis not present

## 2024-01-19 DIAGNOSIS — M5136 Other intervertebral disc degeneration, lumbar region with discogenic back pain only: Secondary | ICD-10-CM | POA: Diagnosis not present

## 2024-01-19 DIAGNOSIS — G4700 Insomnia, unspecified: Secondary | ICD-10-CM | POA: Diagnosis not present

## 2024-01-19 LAB — COMPREHENSIVE METABOLIC PANEL WITH GFR
ALT: 13 U/L (ref 0–44)
AST: 19 U/L (ref 15–41)
Albumin: 3.5 g/dL (ref 3.5–5.0)
Alkaline Phosphatase: 131 U/L — ABNORMAL HIGH (ref 38–126)
Anion gap: 6 (ref 5–15)
BUN: 21 mg/dL (ref 8–23)
CO2: 27 mmol/L (ref 22–32)
Calcium: 8.6 mg/dL — ABNORMAL LOW (ref 8.9–10.3)
Chloride: 103 mmol/L (ref 98–111)
Creatinine, Ser: 0.74 mg/dL (ref 0.44–1.00)
GFR, Estimated: >60 mL/min (ref >60)
Glucose, Bld: 106 mg/dL — ABNORMAL HIGH (ref 70–99)
Potassium: 3.9 mmol/L (ref 3.5–5.1)
Sodium: 136 mmol/L (ref 135–145)
Total Bilirubin: 1 mg/dL (ref 0.0–1.2)
Total Protein: 6.6 g/dL (ref 6.5–8.1)

## 2024-01-19 LAB — URINALYSIS, COMPLETE (UACMP) WITH MICROSCOPIC
Bacteria, UA: NONE SEEN
Bilirubin Urine: NEGATIVE
Glucose, UA: NEGATIVE mg/dL
Hgb urine dipstick: NEGATIVE
Ketones, ur: NEGATIVE mg/dL
Leukocytes,Ua: NEGATIVE
Nitrite: NEGATIVE
Protein, ur: NEGATIVE mg/dL
RBC / HPF: 0 RBC/hpf (ref 0–5)
Specific Gravity, Urine: 1.02 (ref 1.005–1.030)
pH: 8 (ref 5.0–8.0)

## 2024-01-19 LAB — CBC WITH DIFFERENTIAL/PLATELET
Abs Immature Granulocytes: 0.02 10*3/uL (ref 0.00–0.07)
Basophils Absolute: 0 10*3/uL (ref 0.0–0.1)
Basophils Relative: 1 %
Eosinophils Absolute: 0 10*3/uL (ref 0.0–0.5)
Eosinophils Relative: 1 %
HCT: 38.9 % (ref 36.0–46.0)
Hemoglobin: 13 g/dL (ref 12.0–15.0)
Immature Granulocytes: 0 %
Lymphocytes Relative: 16 %
Lymphs Abs: 1 10*3/uL (ref 0.7–4.0)
MCH: 30.5 pg (ref 26.0–34.0)
MCHC: 33.4 g/dL (ref 30.0–36.0)
MCV: 91.3 fL (ref 80.0–100.0)
Monocytes Absolute: 0.6 10*3/uL (ref 0.1–1.0)
Monocytes Relative: 9 %
Neutro Abs: 4.7 10*3/uL (ref 1.7–7.7)
Neutrophils Relative %: 73 %
Platelets: 280 10*3/uL (ref 150–400)
RBC: 4.26 MIL/uL (ref 3.87–5.11)
RDW: 14 % (ref 11.5–15.5)
WBC: 6.4 10*3/uL (ref 4.0–10.5)
nRBC: 0 % (ref 0.0–0.2)

## 2024-01-19 LAB — LACTIC ACID, PLASMA: Lactic Acid, Venous: 1.6 mmol/L (ref 0.5–1.9)

## 2024-01-19 LAB — APTT: aPTT: 31 s (ref 24–36)

## 2024-01-19 LAB — PROTIME-INR
INR: 0.9 (ref 0.8–1.2)
Prothrombin Time: 12.8 s (ref 11.4–15.2)

## 2024-01-19 LAB — TROPONIN I (HIGH SENSITIVITY): Troponin I (High Sensitivity): 5 ng/L (ref <18)

## 2024-01-19 MED ORDER — TRAZODONE HCL 50 MG PO TABS
50.0000 mg | ORAL_TABLET | Freq: Every day | ORAL | Status: DC
  Administered 2024-01-19 – 2024-01-26 (×8): 50 mg via ORAL
  Filled 2024-01-19 (×3): qty 1
  Filled 2024-01-19: qty 2
  Filled 2024-01-19 (×4): qty 1

## 2024-01-19 MED ORDER — TRAMADOL HCL 50 MG PO TABS
25.0000 mg | ORAL_TABLET | Freq: Four times a day (QID) | ORAL | Status: DC | PRN
  Administered 2024-01-19 – 2024-01-20 (×4): 50 mg via ORAL
  Administered 2024-01-21: 25 mg via ORAL
  Administered 2024-01-22 – 2024-01-26 (×2): 50 mg via ORAL
  Filled 2024-01-19 (×8): qty 1

## 2024-01-19 MED ORDER — SERTRALINE HCL 50 MG PO TABS
50.0000 mg | ORAL_TABLET | Freq: Two times a day (BID) | ORAL | Status: DC
  Administered 2024-01-19 – 2024-01-27 (×16): 50 mg via ORAL
  Filled 2024-01-19 (×16): qty 1

## 2024-01-19 MED ORDER — ATORVASTATIN CALCIUM 20 MG PO TABS
40.0000 mg | ORAL_TABLET | Freq: Every day | ORAL | Status: DC
  Administered 2024-01-19 – 2024-01-27 (×9): 40 mg via ORAL
  Filled 2024-01-19 (×9): qty 2

## 2024-01-19 MED ORDER — ACETAMINOPHEN 500 MG PO TABS
1000.0000 mg | ORAL_TABLET | Freq: Once | ORAL | Status: AC
  Administered 2024-01-19: 1000 mg via ORAL
  Filled 2024-01-19: qty 2

## 2024-01-19 MED ORDER — ENOXAPARIN SODIUM 40 MG/0.4ML IJ SOSY: 40.0000 mg | PREFILLED_SYRINGE | INTRAMUSCULAR | Status: DC

## 2024-01-19 MED ORDER — CLOPIDOGREL BISULFATE 75 MG PO TABS
75.0000 mg | ORAL_TABLET | Freq: Every day | ORAL | Status: DC
  Administered 2024-01-19 – 2024-01-27 (×9): 75 mg via ORAL
  Filled 2024-01-19 (×9): qty 1

## 2024-01-19 MED ORDER — IOHEXOL 350 MG/ML SOLN
100.0000 mL | Freq: Once | INTRAVENOUS | Status: AC | PRN
  Administered 2024-01-19: 100 mL via INTRAVENOUS

## 2024-01-19 MED ORDER — ENOXAPARIN SODIUM 60 MG/0.6ML IJ SOSY
0.5000 mg/kg | PREFILLED_SYRINGE | INTRAMUSCULAR | Status: DC
  Administered 2024-01-19 – 2024-01-26 (×8): 47.5 mg via SUBCUTANEOUS
  Filled 2024-01-19 (×8): qty 0.6

## 2024-01-19 MED ORDER — HYDROCHLOROTHIAZIDE 25 MG PO TABS
25.0000 mg | ORAL_TABLET | Freq: Every day | ORAL | Status: DC
  Administered 2024-01-19 – 2024-01-27 (×9): 25 mg via ORAL
  Filled 2024-01-19 (×9): qty 1

## 2024-01-19 MED ORDER — FENTANYL CITRATE PF 50 MCG/ML IJ SOSY
25.0000 ug | PREFILLED_SYRINGE | Freq: Once | INTRAMUSCULAR | Status: AC
  Administered 2024-01-19: 25 ug via INTRAVENOUS
  Filled 2024-01-19: qty 1

## 2024-01-19 MED ORDER — HYDROMORPHONE HCL 1 MG/ML IJ SOLN
0.5000 mg | INTRAMUSCULAR | Status: DC | PRN
  Administered 2024-01-19 – 2024-01-20 (×4): 0.5 mg via INTRAVENOUS
  Filled 2024-01-19 (×4): qty 0.5

## 2024-01-19 MED ORDER — HYDROMORPHONE HCL 1 MG/ML IJ SOLN
0.5000 mg | Freq: Once | INTRAMUSCULAR | Status: AC
  Administered 2024-01-19: 0.5 mg via INTRAVENOUS
  Filled 2024-01-19: qty 0.5

## 2024-01-19 MED ORDER — ACETAMINOPHEN 325 MG PO TABS: 650.0000 mg | ORAL_TABLET | Freq: Four times a day (QID) | ORAL | Status: DC | PRN

## 2024-01-19 MED ORDER — MORPHINE SULFATE (PF) 2 MG/ML IV SOLN
2.0000 mg | Freq: Once | INTRAVENOUS | Status: AC
  Administered 2024-01-19: 2 mg via INTRAVENOUS
  Filled 2024-01-19: qty 1

## 2024-01-19 MED ORDER — ONDANSETRON HCL 4 MG/2ML IJ SOLN: 4.0000 mg | Freq: Four times a day (QID) | INTRAMUSCULAR | Status: DC | PRN

## 2024-01-19 NOTE — ED Notes (Signed)
 Daughter and son in law at bedside. Pt repositioned by this RN, 2 warms blankets given. NAD, CB within reach.

## 2024-01-19 NOTE — ED Notes (Signed)
 Patient called out for the bathroom. This NT assisted patient with the bed pan. Patient was able to use the bathroom. Patient is resting comfortably at this time.

## 2024-01-19 NOTE — H&P (Signed)
 History and Physical    Patient: Lindsey Barnes ZOX:[id number] DOB: Feb 26, 1933 DOA: 01/19/2024 DOS: the patient was seen and examined on 01/19/2024 PCP: Dorothey Baseman, MD  Patient coming from: Home  Chief Complaint: Back pain Chief Complaint  Patient presents with   Chest Pain   Back Pain   Shortness of Breath   HPI: Lindsey Barnes is a 89 y.o. female with medical history significant of Hypertension, GERD, chronic back pain, history of breast cancer, TIA, who comes seen with complaints of back pain with intensity 10/10.  According to patient's daughter present at bedside patient nearly had a syncopal episode today at home.  She initially started complaining of some associated numbness in bilateral hands however this had resolved by the time patient was being seen.  She complained of some abdominal pain mostly in the epigastric region.  Denies nausea vomiting chest pain or urinary complaints.  Patient was recently admitted on 01/05/2024 and was found to have some old compression fracture and at that time managed with pain medication and discharged home however according to patient's daughter patient has continued to be in pain and still not able to walk with her regular walker and therefore brought in for further management. ED course: Temperature 97.6 respiratory rate 30 pulse 74 blood pressure 146/84 saturating on 2 L; 97%.  Given above concerns hospitalist consulted for contacted to admit patient for further management  Review of Systems: As mentioned in the history of present illness. All other systems reviewed and are negative. Past Medical History:  Diagnosis Date   Anxiety    Breast cancer (HCC)    1990   Complication of anesthesia    Depression    Dyspnea    DOE   GERD (gastroesophageal reflux disease)    Headache(784.0)    HOH (hard of hearing)    Hypertension    Lymphoma (HCC)    Pain    CHRONIC BACK   Pain    CHRONIC BACK   Pneumonia    PONV  (postoperative nausea and vomiting)    Tremors of nervous system    HANDS OCCAS   Past Surgical History:  Procedure Laterality Date   APPENDECTOMY     BACK SURGERY     BREAST SURGERY     CATARACT EXTRACTION W/PHACO Left 01/26/2017   Procedure: CATARACT EXTRACTION PHACO AND INTRAOCULAR LENS PLACEMENT (IOC);  Surgeon: Galen Manila, MD;  Location: [facility] ORS;  Service: Ophthalmology;  Laterality: Left;  [place] 53.7 AP% 20.3 CDE 10.90 Fluid pack lot # [id number]   CATARACT EXTRACTION W/PHACO Right 02/16/2017   Procedure: CATARACT EXTRACTION PHACO AND INTRAOCULAR LENS PLACEMENT (IOC);  Surgeon: Galen Manila, MD;  Location: [facility] ORS;  Service: Ophthalmology;  Laterality: Right;  [place] 00:55 AP% 22.5 CDE 12.43 Fluid pack lot # [id number]   CESAREAN SECTION     CHOLECYSTECTOMY N/A 03/20/2018   Procedure: LAPAROSCOPIC CHOLECYSTECTOMY;  Surgeon: Carolan Shiver, MD;  Location: [facility] ORS;  Service: General;  Laterality: N/A;   MASTECTOMY Right 1992   TUBAL LIGATION     Social History:  reports that she has never smoked. She has never used smokeless tobacco. She reports current drug use. Drugs: Hydrocodone and Benzodiazepines. She reports that she does not drink alcohol.  Allergies  Allergen Reactions   Amoxicillin Swelling    Blisters.    Aspirin Other (See Comments)    Stomach ulcers.   Benadryl [Diphenhydramine Hcl] Other (See Comments)    Hyperactive.    No family history on  file.  Prior to Admission medications   Medication Sig Start Date End Date Taking? Authorizing Provider  atorvastatin (LIPITOR) 40 MG tablet Take 1 tablet (40 mg total) by mouth daily. 01/09/24   Jonah Blue, MD  cholecalciferol (VITAMIN D) 1000 units tablet Take 2,000 Units by mouth daily.    [provider]  clopidogrel (PLAVIX) 75 MG tablet Take 1 tablet (75 mg total) by mouth daily. 01/19/21   Marrion Coy, MD  diazepam (VALIUM) 2 MG tablet Take 2 mg by mouth every 6 (six) hours as needed for  anxiety.    [provider]  docusate sodium (COLACE) 100 MG capsule Take 100 mg by mouth daily as needed for mild constipation or moderate constipation.    [provider]  fluticasone (FLONASE) 50 MCG/ACT nasal spray Place 2 sprays into both nostrils daily. 08/16/23   [provider]  hydrochlorothiazide (HYDRODIURIL) 25 MG tablet Take 1 tablet by mouth daily. 08/16/23   [provider]  KLOR-CON M20 20 MEQ tablet Take 20 mEq by mouth in the morning and at bedtime. 01/03/21   [provider]  meclizine (ANTIVERT) 25 MG tablet Take 25 mg by mouth 3 (three) times daily as needed. For dizziness    [provider]  propranolol (INDERAL) 20 MG tablet Take 20 mg by mouth 2 (two) times daily. 11/18/20   [provider]  scopolamine (TRANSDERM-SCOP) 1 MG/3DAYS Place 1 patch onto the skin every 3 (three) days. As needed    [provider]  sertraline (ZOLOFT) 100 MG tablet Take 50 mg by mouth 2 (two) times daily.     [provider]  traMADol (ULTRAM) 50 MG tablet Take 25-50 mg by mouth every 6 (six) hours as needed for moderate pain (pain score 4-6).    [provider]  traZODone (DESYREL) 50 MG tablet Take 50-100 mg by mouth at bedtime.    [provider]  vitamin B-12 (CYANOCOBALAMIN) 500 MCG tablet Take 500 mcg by mouth daily.    [provider]    Physical Exam:   Appearance: She is normal weight.  HENT:     Head: Normocephalic and atraumatic.     Nose: Nose normal.     Mouth/Throat:     Mouth: Mucous membranes are moist.  Eyes:     Pupils: Pupils are equal, round, and reactive to light.  Cardiovascular:     Rate and Rhythm: Normal rate and regular rhythm.  Pulmonary:     Effort: Pulmonary effort is normal.  Abdominal:     General: Bowel sounds are normal.  Musculoskeletal:        General: Normal range of motion.  Skin:    General: Skin is warm and dry.  Neurological:     General:  No focal deficit present.  Psychiatric:        Mood and Affect: Mood normal.   Vitals:   01/19/24 0938 01/19/24 1030 01/19/24 1230 01/19/24 1330  BP:  (!) 159/69 133/74 126/76  Pulse:  70 69 74  Resp:  18 (!) 25 (!) 24  Temp:      TempSrc:      SpO2:  99% 96% 97%  Weight: 96.3 kg     Height: 5\' 7"  (1.702 m)       Data Reviewed: CT scan of the abdomen reviewed    Latest Ref Rng & Units 01/19/2024    9:48 AM 01/08/2024    5:14 AM 01/07/2024    4:39 AM  CBC  WBC 4.0 - 10.5 K/uL 6.4  3.9  4.9   Hemoglobin 12.0 - 15.0 g/dL 16.1  09.6  04.5   Hematocrit 36.0 - 46.0 % 38.9  34.0  36.5   Platelets 150 - 400 K/uL 280  139  151        Latest Ref Rng & Units 01/19/2024    9:48 AM 01/08/2024    3:58 AM 01/07/2024    4:39 AM  BMP  Glucose 70 - 99 mg/dL 409  811  914   BUN 8 - 23 mg/dL 21  15  14    Creatinine 0.44 - 1.00 mg/dL 7.82  9.56  2.13   Sodium 135 - 145 mmol/L 136  140  140   Potassium 3.5 - 5.1 mmol/L 3.9  4.2  3.3   Chloride 98 - 111 mmol/L 103  108  108   CO2 22 - 32 mmol/L 27  26  24    Calcium 8.9 - 10.3 mg/dL 8.6  8.1  8.2      Assessment and Plan:  Acute on chronic intractable back pain CT scan revealing findings of old compression fracture noted at T12 and L2 Patient currently on pain medication Consult placed for neurosurgeon for further management PT OT consultation as patient may benefit from rehab placement  GERD Continue Protonix   Hyperlipidemia Continue statin    HTN (hypertension) Continue home antihypertensives  DVT prophylaxis-continue Lovenox   Advance Care Planning:   Code Status: Prior DNR  Consults: Neurosurgery  Family Communication: Discussed with patient daughter at bedside  Severity of Illness: The appropriate patient status for this patient is INPATIENT. Inpatient status is judged to be reasonable and necessary in order to provide the required intensity of service to ensure the patient's safety. The patient's presenting symptoms,  physical exam findings, and initial radiographic and laboratory data in the context of their chronic comorbidities is felt to place them at high risk for further clinical deterioration. Furthermore, it is not anticipated that the patient will be medically stable for discharge from the hospital within 2 midnights of admission.   * I certify that at the point of admission it is my clinical judgment that the patient will require inpatient hospital care spanning beyond 2 midnights from the point of admission due to high intensity of service, high risk for further deterioration and high frequency of surveillance required.*  Author: Loyce Dys, MD 01/19/2024 3:47 PM  For on call review [email or web address].

## 2024-01-19 NOTE — ED Triage Notes (Signed)
 Pt bib [facility] from home c/o generalized chest pain, back pain and sob that started this morning. Pt has a tremor at baseline from a previous stroke but endorses numbness in finger tips that have now resolved. AXO, NAD  136/74 75 HR 99 RA 111 CBG

## 2024-01-19 NOTE — Progress Notes (Signed)
 PHARMACIST - PHYSICIAN COMMUNICATION  CONCERNING:  Enoxaparin (Lovenox) for DVT Prophylaxis    RECOMMENDATION: Patient was prescribed enoxaprin 40mg  q24 hours for VTE prophylaxis.   Filed Weights   01/19/24 0938  Weight: 96.3 kg (212 lb 4.9 oz)    Body mass index is 33.25 kg/m.  Estimated Creatinine Clearance: 57.9 mL/min (by C-G formula based on SCr of 0.74 mg/dL).   Based on [facility] policy patient is candidate for enoxaparin 0.5mg /kg TBW SQ every 24 hours based on BMI being >30.   DESCRIPTION: Pharmacy has adjusted enoxaparin dose per [facility] policy.  Patient is now receiving enoxaparin 47.5 mg every 24 hours   Arlie Riker Rodriguez-Guzman PharmD, BCPS 01/19/2024 3:52 PM

## 2024-01-19 NOTE — ED Provider Notes (Signed)
 [facility]    None    (approximate)   History   Chest Pain, Back Pain, and Shortness of Breath  Pt bib [facility] from home c/o generalized chest pain, back pain and sob that started this morning. Pt has a tremor at baseline from a previous stroke but endorses numbness in finger tips that have now resolved. AXO, NAD  136/74 75 HR 99 RA 111 CBG   HPI Lindsey Buesing is a 89 y.o. female PMH prior CVA, obesity, acid reflux, chronic diastolic heart failure, compression fracture of T12, hypertension, anxiety, breast cancer presents for evaluation of back pain -Patient is a somewhat limited historian but is primarily complaining of low back pain.  Denies chest pain or shortness of breath on my eval. -Not complaining of belly pain but very tender in her belly -Denies any recent fever cough  Collateral gathered from patient's daughter.  Notes that she was shaking complaining of her back pain this morning and looked clammy and was breathing somewhat fast, was told to call ambulance.  No interval falls since recent admission.   Per review of discharge summary from hospital last month, patient does note chronic back pain he presented on 3/25 with syncope.  MRI with punctate acute infarct on left posterior and left frontal lobe cortex, also evidence of UTI.  Possible syncopal episode as well and some cephalopathy in emergency department, ultimately thought to be related to CVA.  Noted to have worsening of chronic T12 compression fracture at that time and redemonstration of known L2 compression fracture which is status post kyphoplasty.     Physical Exam   Triage Vital Signs:  Most recent vital signs: Vitals:   01/19/24 1230 01/19/24 1330  BP: 133/74 126/76  Pulse: 69 74  Resp: (!) 25 (!) 24  Temp:    SpO2: 96% 97%     General: Awake, nontoxic-appearing but looks uncomfortable CV:  Good peripheral perfusion. RRR, RP 2+ Resp:  Mild  tachypnea, CTAB BACK: Tender to palpation throughout midline mid-low back Abd:  No distention.  Notably tender to deep palpation throughout with voluntary guarding   ED Results / Procedures / Treatments   Labs (all labs ordered are listed, but only abnormal results are displayed) Labs Reviewed  COMPREHENSIVE METABOLIC PANEL WITH GFR - Abnormal; Notable for the following components:      Result Value   Glucose, Bld 106 (*)    Calcium 8.6 (*)    Alkaline Phosphatase 131 (*)    All other components within normal limits  URINALYSIS, COMPLETE (UACMP) WITH MICROSCOPIC - Abnormal; Notable for the following components:   Color, Urine YELLOW (*)    APPearance CLEAR (*)    All other components within normal limits  CBC WITH DIFFERENTIAL/PLATELET  PROTIME-INR  APTT  LACTIC ACID, PLASMA  CBC  CREATININE, SERUM  TROPONIN I (HIGH SENSITIVITY)     EKG  Ecg = NSR, rate 74, left bundle branch block present, no clear evidence of ischemia my read.  Prior.   RADIOLOGY See ED course below  PROCEDURES:  Critical Care performed: No  Procedures   MEDICATIONS ORDERED IN ED: Medications  enoxaparin (LOVENOX) injection 47.5 mg (has no administration in time range)  acetaminophen (TYLENOL) tablet 1,000 mg (1,000 mg Oral Given 01/19/24 0955)  fentaNYL (SUBLIMAZE) injection 25 mcg (25 mcg Intravenous Given 01/19/24 0956)  iohexol (OMNIPAQUE) 350 MG/ML injection 100 mL (100 mLs Intravenous Contrast Given 01/19/24 1124)  morphine (PF) 2 MG/ML injection  2 mg (2 mg Intravenous Given 01/19/24 1159)  HYDROmorphone (DILAUDID) injection 0.5 mg (0.5 mg Intravenous Given 01/19/24 1425)  HYDROmorphone (DILAUDID) injection 0.5 mg (0.5 mg Intravenous Given 01/19/24 1543)     IMPRESSION / MDM / ASSESSMENT AND PLAN / ED COURSE  I reviewed the triage vital signs and the nursing notes.                              Differential diagnosis includes, but is not limited to, exacerbation of chronic pain likely 2/2  known spinal fx per chart review, consider some component of anxiety contributing to presentation, pneumonia, ACS.  Do not clinically suspect PE at this time but will reassess if not improving with pain management.  No history to suggest interval new fracture.  Consider patient may have had a near syncopal episode/vasovagal episode in the setting of her pain.  Abdominal exam is abnormal--unclear why she is so tender to palpation throughout, patient and daughter have not noticed any associated GI or GU complaints--will plan for broad workup including abdominal imaging and reassess.  Plan: -Labs  - EKG - cxr -N.p.o.  -CT abdomen pelvis -Pain control -Reassess  Patient's presentation is most consistent with acute presentation with potential threat to life or bodily function.  The patient is on the cardiac monitor to evaluate for evidence of arrhythmia and/or significant heart rate changes.  ED course below.  Overall unremarkable.  Fortunately CTA with no acute pathology, did redemonstrate known T12 fracture as well as L2 fracture.  No clear etiology of patient's pain, did require serial rounds of narcotics in addition to Tylenol here in emergency department without adequate pain control.  Discussed extensively with patient's family bedside, notes that she is 89 years old, lives alone, newly unable to walk today due to pain, had a near syncopal episode when attempting to stand (may have been vasovagal in the setting of pain.)  They do not feel safe with her going home and I believe this is very reasonable, not able to meet current ADLs.  Would benefit from further pain control, reevaluation for any further etiology of her pain though no evidence of lower extremity weakness or urinary retention or incontinence to suggest acute spinal cord pathology.  Would benefit from PT, OT and possible placement.  Nonemergent discussion with neurosurgery regarding possible kyphoplasty or other surgical intervention  warranted.  Admitted to medicine service.  Clinical Course as of 01/19/24 1603  Wed Jan 19, 2024  1009 Called daughter Caprice Kluver  - when arrived home, noted pt was shaking but conversive, c/o pain,  not having seizure, gave her pain pill - felt she looked clammy and that she was feeling numb all over her body - normally has pain in her back   No c/o chest pain and looked like may be having hard time catching her breath  No new falls since prior to hospitalization  [phone] -- call back with updated (number listed for her in chart is incorrect) [MM]  1021 Ecg = sinus rhythm, rate 74, left bundle branch block present.  Left axis deviation.  Normal intervals, QTc 490.  No gross evidence of ischemia on my read. [MM]  1103 Trop wnl [MM]  1103 CBC, BMP reviewed, unremarkable [MM]  1103 Urinalysis unremarkable [MM]  1311 Chest x-ray reviewed, unremarkable on my interpretation, radiology read reviewed [MM]  1400 CT with no acute pathology on my interpretation  Formal read below: IMPRESSION:  1. No evidence of mesenteric ischemia. 2. Approximately 50% stenosis at the origin of the left renal artery. 3. Moderate-sized hiatal hernia. 4. Small right pleural effusion. 5. Borderline cardiomegaly. 6. Old T12 and L2 vertebral compression deformities with associated bony retropulsion causing mild spinal stenosis at T12 and moderate spinal stenosis at L2. 7. Moderate foraminal stenosis on the left at T12-L1. 8. Multifactorial moderate spinal stenosis and moderate right and moderate to marked left foraminal stenosis at L4-5. 9. Aortic atherosclerosis.   [MM]  1440 Reevaluated, still with significant low back pain to the point where she was unable to stand or walk today.  Family is bedside, discussed that patient does lives at home alone.  Almost fainted and fell when she attempted to stand today.  Normally ambulatory with walker without assistance.  Concerned about her ability to function at  home which I believe is very reasonable.  Unable to adequately control pain here, will discuss with hospitalist for possible admission and pain management, PT OT, further evaluation of pain, possible placement. [MM]  1450 Presented to Dr. Hedy Jacob for admission to hospitalist service [MM]    Clinical Course User Index [MM] Marinell Blight, MD     FINAL CLINICAL IMPRESSION(S) / ED DIAGNOSES   Final diagnoses:  Low back pain, unspecified back pain laterality, unspecified chronicity, unspecified whether sciatica present  Compression fracture of T12 vertebra with routine healing, subsequent encounter  Intractable pain  Difficulty walking     Rx / DC Orders   ED Discharge Orders     None        Note:  This document was prepared using Dragon voice recognition software and may include unintentional dictation errors.   Marinell Blight, MD 01/19/24 [phone]

## 2024-01-20 DIAGNOSIS — S3210XA Unspecified fracture of sacrum, initial encounter for closed fracture: Secondary | ICD-10-CM

## 2024-01-20 DIAGNOSIS — W19XXXA Unspecified fall, initial encounter: Secondary | ICD-10-CM | POA: Diagnosis not present

## 2024-01-20 DIAGNOSIS — S32020D Wedge compression fracture of second lumbar vertebra, subsequent encounter for fracture with routine healing: Secondary | ICD-10-CM | POA: Diagnosis not present

## 2024-01-20 MED ORDER — MORPHINE SULFATE ER 15 MG PO TBCR
15.0000 mg | EXTENDED_RELEASE_TABLET | ORAL | Status: AC
  Administered 2024-01-20: 15 mg via ORAL
  Filled 2024-01-20: qty 1

## 2024-01-20 MED ORDER — HYDROMORPHONE HCL 1 MG/ML IJ SOLN
1.0000 mg | INTRAMUSCULAR | Status: DC | PRN
  Administered 2024-01-20 – 2024-01-26 (×2): 1 mg via INTRAVENOUS
  Filled 2024-01-20 (×2): qty 1

## 2024-01-20 MED ORDER — MORPHINE SULFATE ER 15 MG PO TBCR
15.0000 mg | EXTENDED_RELEASE_TABLET | Freq: Two times a day (BID) | ORAL | Status: DC
  Administered 2024-01-20 – 2024-01-21 (×2): 15 mg via ORAL
  Filled 2024-01-20 (×2): qty 1

## 2024-01-20 MED ORDER — FLUTICASONE PROPIONATE 50 MCG/ACT NA SUSP
2.0000 | Freq: Every day | NASAL | Status: DC
  Administered 2024-01-20 – 2024-01-27 (×8): 2 via NASAL
  Filled 2024-01-20: qty 16

## 2024-01-20 MED ORDER — MORPHINE SULFATE ER 30 MG PO TBCR: 30.0000 mg | EXTENDED_RELEASE_TABLET | Freq: Two times a day (BID) | ORAL | Status: DC

## 2024-01-20 NOTE — Plan of Care (Signed)
  Problem: Clinical Measurements: Goal: Respiratory complications will improve Outcome: Progressing Goal: Cardiovascular complication will be avoided Outcome: Progressing   Problem: Activity: Goal: Risk for activity intolerance will decrease Outcome: Not Progressing   Problem: Pain Managment: Goal: General experience of comfort will improve and/or be controlled Outcome: Not Progressing

## 2024-01-20 NOTE — Consult Note (Signed)
 Consult requested by:  Dr. Meriam Sprague  Consult requested for:  Back pain  Primary Physician:  Dorothey Baseman, MD  History of Present Illness: 01/20/2024 Ms. Lindsey Barnes is here today with a chief complaint of back pain.  Her pain is primarily on the right side and extends from the thoracic spine down to her pelvis.  She has had longstanding back pain, but it has been worse since a fall approximately 2 weeks ago.  She denies any numbness or tingling down her legs.  She has no new weakness.  Of note, she has had prior history of compression fractures and has had treatment for that.  She lives alone and is on chronic pain medication for her back pain.  I have utilized the care everywhere function in epic to review the outside records available from external health systems.  Review of Systems:  A 10 point review of systems is negative, except for the pertinent positives and negatives detailed in the HPI.  Past Medical History: Past Medical History:  Diagnosis Date   Anxiety    Breast cancer (HCC)    1990   Complication of anesthesia    Depression    Dyspnea    DOE   GERD (gastroesophageal reflux disease)    Headache(784.0)    HOH (hard of hearing)    Hypertension    Lymphoma (HCC)    Pain    CHRONIC BACK   Pain    CHRONIC BACK   Pneumonia    PONV (postoperative nausea and vomiting)    Tremors of nervous system    HANDS OCCAS    Past Surgical History: Past Surgical History:  Procedure Laterality Date   APPENDECTOMY     BACK SURGERY     BREAST SURGERY     CATARACT EXTRACTION W/PHACO Left 01/26/2017   Procedure: CATARACT EXTRACTION PHACO AND INTRAOCULAR LENS PLACEMENT (IOC);  Surgeon: Galen Manila, MD;  Location: [facility];  Service: Ophthalmology;  Laterality: Left;  [place] 53.7 AP% 20.3 CDE 10.90 Fluid pack lot # [id number]   CATARACT EXTRACTION W/PHACO Right 02/16/2017   Procedure: CATARACT EXTRACTION PHACO AND INTRAOCULAR LENS PLACEMENT (IOC);  Surgeon: Galen Manila, MD;  Location: [facility];  Service: Ophthalmology;  Laterality: Right;  [place] 00:55 AP% 22.5 CDE 12.43 Fluid pack lot # [id number]   CESAREAN SECTION     CHOLECYSTECTOMY N/A 03/20/2018   Procedure: LAPAROSCOPIC CHOLECYSTECTOMY;  Surgeon: Carolan Shiver, MD;  Location: [facility];  Service: General;  Laterality: N/A;   MASTECTOMY Right 1992   TUBAL LIGATION      Allergies: Allergies as of 01/19/2024 - Review Complete 01/19/2024  Allergen Reaction Noted   Amoxicillin Swelling 01/13/2012   Aspirin Other (See Comments) 01/13/2012   Benadryl [diphenhydramine hcl] Other (See Comments) 01/13/2012    Medications: Current Meds  Medication Sig   atorvastatin (LIPITOR) 40 MG tablet Take 1 tablet (40 mg total) by mouth daily.   cholecalciferol (VITAMIN D) 1000 units tablet Take 2,000 Units by mouth daily.   clopidogrel (PLAVIX) 75 MG tablet Take 1 tablet (75 mg total) by mouth daily.   diazepam (VALIUM) 2 MG tablet Take 2 mg by mouth every 6 (six) hours as needed for anxiety.   docusate sodium (COLACE) 100 MG capsule Take 100 mg by mouth daily as needed for mild constipation or moderate constipation.   fluticasone (FLONASE) 50 MCG/ACT nasal spray Place 2 sprays into both nostrils daily.   hydrochlorothiazide (HYDRODIURIL) 25 MG tablet Take 25 mg by mouth  daily.   KLOR-CON M20 20 MEQ tablet Take 20 mEq by mouth in the morning and at bedtime.   meclizine (ANTIVERT) 25 MG tablet Take 25 mg by mouth 3 (three) times daily as needed. For dizziness   propranolol (INDERAL) 20 MG tablet Take 20 mg by mouth 2 (two) times daily.   scopolamine (TRANSDERM-SCOP) 1 MG/3DAYS Place 1 patch onto the skin every 3 (three) days. As needed   sertraline (ZOLOFT) 100 MG tablet Take 50 mg by mouth 2 (two) times daily.    traMADol (ULTRAM) 50 MG tablet Take 25-50 mg by mouth every 6 (six) hours as needed for moderate pain (pain score 4-6).   traZODone (DESYREL) 50 MG tablet Take 50-100 mg by mouth at bedtime.    vitamin B-12 (CYANOCOBALAMIN) 500 MCG tablet Take 500 mcg by mouth daily.    Social History: Social History   Tobacco Use   Smoking status: Never   Smokeless tobacco: Never  Substance Use Topics   Alcohol use: No   Drug use: Yes    Types: Hydrocodone, Benzodiazepines    Family Medical History: History reviewed. No pertinent family history.  Physical Examination: Vitals:   01/20/24 0342 01/20/24 0823  BP: (!) 142/70 133/62  Pulse: 83 78  Resp: 18 16  Temp: 97.8 F (36.6 C) 97.9 F (36.6 C)  SpO2: 95% 98%    General: Patient is in no apparent distress. Attention to examination is appropriate.  Neck:   Supple.  Full range of motion.  Respiratory: Patient is breathing without any difficulty.   NEUROLOGICAL:     Awake, alert, oriented to person, place, and time.  Speech is clear and fluent.  Cranial Nerves: Pupils equal round and reactive to light.  Facial tone is symmetric.  Facial sensation is symmetric. Shoulder shrug is symmetric. Tongue protrusion is midline.  There is no pronator drift.  Strength: Moves UE well   Side Iliopsoas Quads Hamstring PF DF EHL  R 5 5 5 5 5 5   L 5 5 5 5 5 5    Reflexes are 1+ and symmetric at the biceps, triceps, brachioradialis, patella and achilles.   Hoffman's is absent.   Bilateral upper and lower extremity sensation is intact to light touch.    No evidence of dysmetria noted.  Gait is untested.     Medical Decision Making  Imaging: MRI L spine 01/19/2024 IMPRESSION: 1. Question abnormal marrow edema within the partially visualized right sacral ala, raising the possibility for an acute/recent nondisplaced sacral fracture. Correlation with physical exam recommended. Additionally, follow-up examination with dedicated MRI of the sacrum could be performed for further evaluation as warranted. 2. No other acute abnormality within the lumbar spine. 3. Chronic compression fractures of T12 and L2 with 4 mm bony retropulsion,  and sequelae of prior vertebral augmentation at L2. No significant spinal stenosis or neural impingement at these levels. 4. Mild noncompressive disc bulging with facet hypertrophy at L3-4 and L4-5 with resultant mild bilateral L4 foraminal stenosis.     Electronically Signed   By: Rise Mu M.D.   On: 01/19/2024 20:03    I have personally reviewed the images and agree with the above interpretation.  Assessment and Plan: Ms. Glock is a pleasant 89 y.o. female with severe back pain with history of multiple compression fractures as well as a possible new nondisplaced sacral fracture.  I would not recommend surgical intervention.  I discussed dedicated MRI of the pelvis with the patient, but she declined as that  will not change current management.  I have ordered an LSO brace to see if this will help.  Additionally, she should proceed with multimodal pain management with NSAIDs, Tylenol, muscle actions, and pain medication as needed as long as none of these are contraindicated by other medical conditions.  She is okay to weight-bear as tolerated from my standpoint.  I think she would benefit from following up as an outpatient with either physiatry or pain management to consider injection therapy.  To follow-up her sacral fracture, I would recommend referral to the [facility] trauma group in [place] (Dr. Carola Frost or [place]).  I have communicated my recommendations to the requesting physician and coordinated care to facilitate these recommendations.     Cortnee Steinmiller K. Myer Haff MD, [facility] Neurosurgery

## 2024-01-20 NOTE — Evaluation (Signed)
 Physical Therapy Evaluation Patient Details Name: Lindsey Barnes MRN: [id number] DOB: 1933-09-14 Today's Date: 01/20/2024  History of Present Illness  Pt is an 89 y.o. female presenting to hospital 01/19/24 with c/o chest pain, back pain, and SOB that started that morning; numbness in finger tips that resolved; has tremor at baseline from previous stroke.  Imaging showing possible acute/recent nondisplaced sacral fx; chronic compression fx's of T12 and L2 with 4mm bony retropulsion.  PMH includes h/o CVA, acid reflux, chronic distolic HF, compression fx T12, htn, anxiety, breast CA, kyphoplasty.  Clinical Impression  PT/OT co-evaluation performed.  Prior to recent medical concerns, pt reports being ambulatory (typically using SPC but recently using RW); lives alone in 1 level home with ramp to enter.  R sided back pain reported during session (7-8/10 at rest beginning of session; increased to 10/10 with activities; 5/10 at rest end of session; nurse updated on pt's pain status).  Currently pt is 1-2 assist with bed mobility via logrolling; min assist x2 to stand from regular bed height up to RW; and CGA x2 to take some steps along bed to R side with RW use.  Limited time standing d/t pt c/o "swimmy-headedness" and feeling like she was going to fall.  Sitting BP 140/70 (MAP 89) with HR 101 bpm (pt c/o mild dizziness); standing BP 132/120 (MAP 126) with HR 134 bpm (pt c/o swimmy-headedness and felt like she was going to fall).  Pt assisted back to bed end of session and repositioned to improve comfort.  Pt would currently benefit from skilled PT to address noted impairments and functional limitations (see below for any additional details).  Upon hospital discharge, pt would benefit from ongoing therapy.     If plan is discharge home, recommend the following: A lot of help with walking and/or transfers;A lot of help with bathing/dressing/bathroom;Assistance with cooking/housework;Assist for  transportation;Help with stairs or ramp for entrance   Can travel by private vehicle   No    Equipment Recommendations Rolling walker (2 wheels)  Recommendations for Other Services       Functional Status Assessment Patient has had a recent decline in their functional status and demonstrates the ability to make significant improvements in function in a reasonable and predictable amount of time.     Precautions / Restrictions Precautions Precautions: Fall Precaution/Restrictions Comments: No BP R UE Required Braces or Orthoses: Spinal Brace Spinal Brace: Lumbar corset;Other (comment) Spinal Brace Comments: LSO quick draw; Brace on when OOB; brace for comfort--can get up without her brace Restrictions Weight Bearing Restrictions Per Provider Order: No Other Position/Activity Restrictions: Per neurosurgery note pt WBAT      Mobility  Bed Mobility Overal bed mobility: Needs Assistance Bed Mobility: Rolling, Sidelying to Sit, Sit to Sidelying Rolling: Supervision, Used rails Sidelying to sit: Min assist (assist for trunk)     Sit to sidelying: Min assist, Mod assist, +2 for physical assistance General bed mobility comments: vc's for logrolling technique; assist for trunk and B LE's sit to supine via logrolling    Transfers Overall transfer level: Needs assistance Equipment used: Rolling walker (2 wheels) Transfers: Sit to/from Stand Sit to Stand: Contact guard assist, Min assist, +2 physical assistance           General transfer comment: vc's for UE placement; CGAx2 to stand from elevated bed height; min assist x2 to stand from regular bed height (limited d/t pain)    Ambulation/Gait Ambulation/Gait assistance: Contact guard assist, +2 physical assistance Gait Distance (Feet):  (  side step to R a few feet with RW use) Assistive device: Rolling walker (2 wheels)   Gait velocity: decreased     General Gait Details: decreased B LE step length (side stepping to R  along bed)  Stairs            Wheelchair Mobility     Tilt Bed    Modified Rankin (Stroke Patients Only)       Balance Overall balance assessment: Needs assistance Sitting-balance support: Feet supported, Bilateral upper extremity supported Sitting balance-Leahy Scale: Fair Sitting balance - Comments: steady static sitting   Standing balance support: Bilateral upper extremity supported, Reliant on assistive device for balance Standing balance-Leahy Scale: Poor Standing balance comment: assist for safety (pt reporting feeling like she was going to fall d/t "swimmy-headedness in standing)                             Pertinent Vitals/Pain Pain Assessment Pain Assessment: 0-10 Pain Score: 5  Pain Location: R sided back pain (upper thoracic to low back) Pain Descriptors / Indicators: Sharp, Shooting, Discomfort, Grimacing, Guarding Pain Intervention(s): Limited activity within patient's tolerance, Monitored during session, Premedicated before session, Repositioned, Other (comment) (Pt not due for pain medication yet; nurse updated on pt's status) SpO2 sats 91-92% on room air end of session at rest.    Home Living Family/patient expects to be discharged to:: Private residence Living Arrangements: Alone Available Help at Discharge: Family;Available PRN/intermittently Type of Home: House Home Access: Ramped entrance       Home Layout: One level Home Equipment: Agricultural consultant (2 wheels);Cane - single point;Grab bars - tub/shower;Shower seat - built in      Prior Function               Mobility Comments: Was using SPC but recently using RW.  H/o recent falls.       Extremity/Trunk Assessment   Upper Extremity Assessment Upper Extremity Assessment: Generalized weakness    Lower Extremity Assessment Lower Extremity Assessment: Generalized weakness       Communication   Communication Communication: Impaired Factors Affecting Communication:  Hearing impaired    Cognition Arousal: Alert Behavior During Therapy: WFL for tasks assessed/performed   PT - Cognitive impairments: No apparent impairments                         Following commands: Intact       Cueing Cueing Techniques: Verbal cues, Visual cues, Tactile cues     General Comments General comments (skin integrity, edema, etc.): Sitting BP 140/70 (MAP 89) with HR 101 bpm (pt c/o mild dizziness); standing BP 132/120 (MAP 126) with HR 134 bpm (pt c/o swimmy-headedness and felt like she was going to fall).  LSO quick draw brace utilized during session (donned/doffed in sitting).    Exercises     Assessment/Plan    PT Assessment Patient needs continued PT services  PT Problem List Decreased strength;Decreased activity tolerance;Decreased balance;Decreased mobility;Decreased knowledge of use of DME;Decreased knowledge of precautions;Pain       PT Treatment Interventions DME instruction;Functional mobility training;Gait training;Therapeutic activities;Therapeutic exercise;Balance training;Patient/family education    PT Goals (Current goals can be found in the Care Plan section)  Acute Rehab PT Goals Patient Stated Goal: to improve pain and walking PT Goal Formulation: With patient/family Time For Goal Achievement: 02/03/24 Potential to Achieve Goals: Good    Frequency Min 2X/week  Co-evaluation PT/OT/SLP Co-Evaluation/Treatment: Yes Reason for Co-Treatment: For patient/therapist safety;To address functional/ADL transfers PT goals addressed during session: Mobility/safety with mobility;Proper use of DME;Balance OT goals addressed during session: ADL's and self-care       AM-PAC PT "6 Clicks" Mobility  Outcome Measure Help needed turning from your back to your side while in a flat bed without using bedrails?: A Little Help needed moving from lying on your back to sitting on the side of a flat bed without using bedrails?: A Little Help needed  moving to and from a bed to a chair (including a wheelchair)?: A Little Help needed standing up from a chair using your arms (e.g., wheelchair or bedside chair)?: A Lot Help needed to walk in hospital room?: A Lot Help needed climbing 3-5 steps with a railing? : Total 6 Click Score: 14    End of Session Equipment Utilized During Treatment: Gait belt;Back brace Activity Tolerance: Patient limited by pain;Other (comment) (limited d/t feeling "swimmy-headed" with standing activities) Patient left: in bed;with call bell/phone within reach;with bed alarm set;with family/visitor present Nurse Communication: Mobility status;Precautions;Weight bearing status;Patient requests pain meds PT Visit Diagnosis: Other abnormalities of gait and mobility (R26.89);Muscle weakness (generalized) (M62.81);History of falling (Z91.81);Pain Pain - Right/Left: Right Pain - part of body:  (back)    Time: 1343-1430 PT Time Calculation (min) (ACUTE ONLY): 47 min   Charges:   PT Evaluation $PT Eval Low Complexity: 1 Low PT Treatments $Therapeutic Activity: 8-22 mins PT General Charges $$ ACUTE PT VISIT: 1 Visit        Hendricks Limes, PT 01/20/24, 3:05 PM

## 2024-01-20 NOTE — Progress Notes (Signed)
 Progress Note   Patient: Lindsey Barnes DOB: 1932/12/18 DOA: 01/19/2024     1 DOS: the patient was seen and examined on 01/20/2024   Brief hospital course:  Lindsey Barnes is a 89 y.o. female with medical history significant of Hypertension, GERD, chronic back pain, history of breast cancer, TIA, who comes seen with complaints of back pain with intensity 10/10.  According to patient's daughter present at bedside patient nearly had a syncopal episode today at home.  She initially started complaining of some associated numbness in bilateral hands however this had resolved by the time patient was being seen.  She complained of some abdominal pain mostly in the epigastric region.  Denies nausea vomiting chest pain or urinary complaints.  Patient was recently admitted on 01/05/2024 and was found to have some old compression fracture and at that time managed with pain medication and discharged home however according to patient's daughter patient has continued to be in pain and still not able to walk with her regular walker and therefore brought in for further management. ED course: Temperature 97.6 respiratory rate 30 pulse 74 blood pressure 146/84 saturating on 2 L; 97%.  Given above concerns hospitalist consulted for contacted to admit patient for further management   Assessment and Plan: Acute on chronic intractable back pain CT scan revealing findings of old compression fracture noted at T12 and L2 Patient currently on pain medication I have discussed with neurosurgeon and patient does not want to pursue any surgical intervention and as such has declined to undergo MRI. PT OT consultation as patient may benefit from rehab placement   GERD Continue Protonix   Hyperlipidemia Continue statin    HTN (hypertension) Continue home antihypertensives   DVT prophylaxis-continue Lovenox    Advance Care Planning:   Code Status: Prior DNR   Consults: Neurosurgery   Family  Communication: Discussed with patient daughter at bedside    Subjective:  Patient seen and examined at bedside this morning She admits to pain at the back involving the area of the fracture I have discussed with neurosurgeon and patient does not want to pursue any surgical intervention and as such has declined to undergo MRI. She has agreed to undergo pain management as well as possible rehab Currently pending PT OT eval Denies nausea vomiting chest pain  Physical Exam:  Appearance: She is normal weight.  HENT:     Head: Normocephalic and atraumatic.     Nose: Nose normal.     Mouth/Throat:     Mouth: Mucous membranes are moist.  Eyes:     Pupils: Pupils are equal, round, and reactive to light.  Cardiovascular:     Rate and Rhythm: Normal rate and regular rhythm.  Pulmonary:     Effort: Pulmonary effort is normal.  Abdominal:     General: Bowel sounds are normal.  Musculoskeletal:        General: Normal range of motion.  Skin:    General: Skin is warm and dry.  Neurological:     General: No focal deficit present.  Psychiatric:        Mood and Affect: Mood normal.    Data Reviewed:    Latest Ref Rng & Units 01/19/2024    9:48 AM 01/08/2024    5:14 AM 01/07/2024    4:39 AM  CBC  WBC 4.0 - 10.5 K/uL 6.4  3.9  4.9   Hemoglobin 12.0 - 15.0 g/dL 21.3  08.6  57.8   Hematocrit 36.0 -  46.0 % 38.9  34.0  36.5   Platelets 150 - 400 K/uL 280  139  151        Latest Ref Rng & Units 01/19/2024    9:48 AM 01/08/2024    3:58 AM 01/07/2024    4:39 AM  BMP  Glucose 70 - 99 mg/dL 469  629  528   BUN 8 - 23 mg/dL 21  15  14    Creatinine 0.44 - 1.00 mg/dL 4.13  2.44  0.10   Sodium 135 - 145 mmol/L 136  140  140   Potassium 3.5 - 5.1 mmol/L 3.9  4.2  3.3   Chloride 98 - 111 mmol/L 103  108  108   CO2 22 - 32 mmol/L 27  26  24    Calcium 8.9 - 10.3 mg/dL 8.6  8.1  8.2      Vitals:   01/19/24 1958 01/19/24 2049 01/20/24 0342 01/20/24 0823  BP: 129/65 (!) 146/78 (!) 142/70 133/62   Pulse: 74 74 83 78  Resp: 16 18 18 16   Temp: 97.9 F (36.6 C) 97.8 F (36.6 C) 97.8 F (36.6 C) 97.9 F (36.6 C)  TempSrc: Oral  Oral Oral  SpO2: 97% 98% 95% 98%  Weight:  86 kg    Height:  5\' 8"  (1.727 m)      Time spent: 55 minutes  Author: Loyce Dys, MD 01/20/2024 1:38 PM  For on call review [email or web address].

## 2024-01-20 NOTE — Evaluation (Signed)
 Occupational Therapy Evaluation Patient Details Name: Lindsey Barnes MRN: [id number] DOB: 1932-12-20 Today's Date: 01/20/2024   History of Present Illness   Pt is an 89 y.o. female presenting to hospital 01/19/24 with c/o chest pain, back pain, and SOB that started that morning; numbness in finger tips that resolved; has tremor at baseline from previous stroke.  Imaging showing possible acute/recent nondisplaced sacral fx; chronic compression fx's of T12 and L2 with 4mm bony retropulsion.  PMH includes h/o CVA, acid reflux, chronic distolic HF, compression fx T12, htn, anxiety, breast CA, kyphoplasty.     Clinical Impressions Pt was seen for OT evaluation and co-tx with PT this date. Prior to hospital admission, pt was increasingly debilitated over the past two weeks since returning home from the hospital. Pt lives alone and generally independent, however, increasingly unable to complete tasks herself 2/2 increasing back pain. Pt presents to acute OT demonstrating impaired ADL performance and functional mobility 2/2 significant back/R side pain, impaired strength, balance, and activity tolerance (See OT problem list for additional functional deficits). Pt currently requires MIN-MOD A +2 for STS with RW. MAX A for brace application in sitting and for LB dressing. Pt endorses being nervous and fearful of falling. Pt would benefit from skilled OT services to address noted impairments and functional limitations (see below for any additional details) in order to maximize safety and independence while minimizing falls risk and caregiver burden.    If plan is discharge home, recommend the following:   Two people to help with walking and/or transfers;A lot of help with bathing/dressing/bathroom;Assistance with cooking/housework;Assist for transportation;Help with stairs or ramp for entrance;Direct supervision/assist for medications management     Functional Status Assessment   Patient has had a  recent decline in their functional status and demonstrates the ability to make significant improvements in function in a reasonable and predictable amount of time.     Equipment Recommendations   Other (comment) (defer)     Recommendations for Other Services         Precautions/Restrictions   Precautions Precautions: Fall Recall of Precautions/Restrictions: Intact Precaution/Restrictions Comments: No BP R UE Required Braces or Orthoses: Spinal Brace Spinal Brace: Lumbar corset;Other (comment) Spinal Brace Comments: LSO quick draw; Brace on when OOB; brace for comfort--can get up without her brace Restrictions Weight Bearing Restrictions Per Provider Order: No Other Position/Activity Restrictions: Per neurosurgery note pt WBAT     Mobility Bed Mobility Overal bed mobility: Needs Assistance Bed Mobility: Rolling, Sidelying to Sit, Sit to Sidelying Rolling: Supervision, Used rails Sidelying to sit: Min assist     Sit to sidelying: Min assist, Mod assist, +2 for physical assistance General bed mobility comments: vc's for logrolling technique; assist for trunk and B LE's sit to supine via logrolling    Transfers Overall transfer level: Needs assistance Equipment used: Rolling walker (2 wheels) Transfers: Sit to/from Stand Sit to Stand: Contact guard assist, Min assist, +2 physical assistance           General transfer comment: vc's for UE placement; CGAx2 to stand from elevated bed height; min assist x2 to stand from regular bed height (limited d/t pain)      Balance Overall balance assessment: Needs assistance Sitting-balance support: Feet supported, Bilateral upper extremity supported Sitting balance-Leahy Scale: Fair Sitting balance - Comments: steady static sitting   Standing balance support: Bilateral upper extremity supported, Reliant on assistive device for balance Standing balance-Leahy Scale: Poor Standing balance comment: assist for safety (pt  reporting feeling like  she was going to fall d/t "swimmy-headedness in standing)                           ADL either performed or assessed with clinical judgement   ADL Overall ADL's : Needs assistance/impaired                     Lower Body Dressing: Bed level;Maximal assistance Lower Body Dressing Details (indicate cue type and reason): don socks             Functional mobility during ADLs: Minimal assistance;Moderate assistance;Contact guard assist;+2 for physical assistance;Rolling walker (2 wheels);Cueing for sequencing;Cueing for safety       Vision         Perception         Praxis         Pertinent Vitals/Pain Pain Assessment Pain Assessment: 0-10 Pain Score: 5  Pain Location: R sided back pain (upper thoracic to low back) Pain Descriptors / Indicators: Sharp, Shooting, Discomfort, Grimacing, Guarding Pain Intervention(s): Limited activity within patient's tolerance, Monitored during session, Premedicated before session, Repositioned, Patient requesting pain meds-RN notified     Extremity/Trunk Assessment Upper Extremity Assessment Upper Extremity Assessment: Overall WFL for tasks assessed   Lower Extremity Assessment Lower Extremity Assessment: Overall WFL for tasks assessed       Communication Communication Communication: Impaired Factors Affecting Communication: Hearing impaired   Cognition Arousal: Alert Behavior During Therapy: WFL for tasks assessed/performed, Anxious Cognition: No family/caregiver present to determine baseline             OT - Cognition Comments: pt appears slightly anxious/nervous with ADL mobility efforts, fearful of falling                 Following commands: Intact       Cueing  General Comments   Cueing Techniques: Verbal cues;Visual cues;Tactile cues  Sitting BP 140/70 (MAP 89) with HR 101 bpm (pt c/o mild dizziness); standing BP 132/120 (MAP 126) with HR 134 bpm (pt c/o  swimmy-headedness and felt like she was going to fall)   Exercises Other Exercises Other Exercises: Pt/dtr educated in pain management   Shoulder Instructions      Home Living Family/patient expects to be discharged to:: Private residence Living Arrangements: Alone Available Help at Discharge: Family;Available PRN/intermittently Type of Home: House Home Access: Ramped entrance     Home Layout: One level     Bathroom Shower/Tub: Arts development officer Toilet: Handicapped height     Home Equipment: Agricultural consultant (2 wheels);Cane - single point;Grab bars - tub/shower;Shower seat - built in      Lives With: Alone    Prior Functioning/Environment               Mobility Comments: Was using SPC but recently using RW.  H/o recent falls.      OT Problem List: Decreased strength;Decreased activity tolerance;Decreased safety awareness;Impaired balance (sitting and/or standing);Decreased knowledge of use of DME or AE;Pain   OT Treatment/Interventions: Self-care/ADL training;Therapeutic exercise;Therapeutic activities;Energy conservation;DME and/or AE instruction;Patient/family education;Balance training      OT Goals(Current goals can be found in the care plan section)   Acute Rehab OT Goals Patient Stated Goal: have less pain OT Goal Formulation: With patient/family Time For Goal Achievement: 02/03/24 Potential to Achieve Goals: Good ADL Goals Pt Will Perform Upper Body Dressing: sitting;with min assist Pt Will Perform Lower Body Dressing: sit to/from stand;with min assist  Pt Will Transfer to Toilet: bedside commode;ambulating;with contact guard assist (LRAD) Pt Will Perform Toileting - Clothing Manipulation and hygiene: sit to/from stand;sitting/lateral leans;with supervision   OT Frequency:  Min 2X/week    Co-evaluation PT/OT/SLP Co-Evaluation/Treatment: Yes Reason for Co-Treatment: For patient/therapist safety;To address functional/ADL transfers PT goals  addressed during session: Mobility/safety with mobility;Proper use of DME;Balance OT goals addressed during session: ADL's and self-care      AM-PAC OT "6 Clicks" Daily Activity     Outcome Measure Help from another person eating meals?: None Help from another person taking care of personal grooming?: None Help from another person toileting, which includes using toliet, bedpan, or urinal?: A Lot Help from another person bathing (including washing, rinsing, drying)?: A Lot Help from another person to put on and taking off regular upper body clothing?: A Little Help from another person to put on and taking off regular lower body clothing?: A Lot 6 Click Score: 17   End of Session Equipment Utilized During Treatment: Rolling walker (2 wheels) Nurse Communication: Mobility status;Patient requests pain meds  Activity Tolerance: Patient limited by pain;Patient tolerated treatment well Patient left: in bed;with call bell/phone within reach;with bed alarm set;with family/visitor present  OT Visit Diagnosis: Unsteadiness on feet (R26.81);Repeated falls (R29.6);Muscle weakness (generalized) (M62.81)                Time: 1400-1430 OT Time Calculation (min): 30 min Charges:  OT General Charges $OT Visit: 1 Visit OT Evaluation $OT Eval Moderate Complexity: 1 Mod OT Treatments $Therapeutic Activity: 8-22 mins  Arman Filter., MPH, MS, OTR/L ascom [phone] 01/20/24, 4:25 PM

## 2024-01-20 NOTE — Plan of Care (Signed)
  Problem: Clinical Measurements: Goal: Ability to maintain clinical measurements within normal limits will improve Outcome: Progressing   Problem: Activity: Goal: Risk for activity intolerance will decrease Outcome: Progressing   Problem: Nutrition: Goal: Adequate nutrition will be maintained Outcome: Progressing   Problem: Pain Managment: Goal: General experience of comfort will improve and/or be controlled Outcome: Progressing

## 2024-01-20 NOTE — Progress Notes (Signed)
 Orthopedic Tech Progress Note Patient Details:  Lindsey Barnes [birth year] [id number]  Order for an LSO called into [facility].  Patient ID: [name], female   DOB: 02-10-1933, 89 y.o.   MRN: [id number]  Docia Furl 01/20/2024, 9:55 AM

## 2024-01-21 DIAGNOSIS — S32020A Wedge compression fracture of second lumbar vertebra, initial encounter for closed fracture: Secondary | ICD-10-CM | POA: Diagnosis not present

## 2024-01-21 MED ORDER — MORPHINE SULFATE ER 30 MG PO TBCR
30.0000 mg | EXTENDED_RELEASE_TABLET | Freq: Two times a day (BID) | ORAL | Status: DC
  Administered 2024-01-21 – 2024-01-27 (×12): 30 mg via ORAL
  Filled 2024-01-21 (×12): qty 1

## 2024-01-21 MED ORDER — DOCUSATE SODIUM 100 MG PO CAPS
100.0000 mg | ORAL_CAPSULE | Freq: Two times a day (BID) | ORAL | Status: DC
  Administered 2024-01-21 – 2024-01-27 (×13): 100 mg via ORAL
  Filled 2024-01-21 (×13): qty 1

## 2024-01-21 MED ORDER — MORPHINE SULFATE ER 15 MG PO TBCR
15.0000 mg | EXTENDED_RELEASE_TABLET | ORAL | Status: AC
  Administered 2024-01-21: 15 mg via ORAL
  Filled 2024-01-21: qty 1

## 2024-01-21 MED ORDER — MECLIZINE HCL 25 MG PO TABS
25.0000 mg | ORAL_TABLET | Freq: Three times a day (TID) | ORAL | Status: DC | PRN
  Administered 2024-01-21 – 2024-01-27 (×5): 25 mg via ORAL
  Filled 2024-01-21 (×7): qty 1

## 2024-01-21 MED ORDER — POLYETHYLENE GLYCOL 3350 17 G PO PACK
17.0000 g | PACK | Freq: Every day | ORAL | Status: DC
  Administered 2024-01-21 – 2024-01-26 (×6): 17 g via ORAL
  Filled 2024-01-21 (×7): qty 1

## 2024-01-21 NOTE — Progress Notes (Signed)
 Occupational Therapy Treatment Patient Details Name: Lindsey Barnes MRN: [id number] DOB: May 30, 1933 Today's Date: 01/21/2024   History of present illness Pt is an 89 y.o. female presenting to hospital 01/19/24 with c/o chest pain, back pain, and SOB that started that morning; numbness in finger tips that resolved; has tremor at baseline from previous stroke.  Imaging showing possible acute/recent nondisplaced sacral fx; chronic compression fx's of T12 and L2 with 4mm bony retropulsion.  PMH includes h/o CVA, acid reflux, chronic distolic HF, compression fx T12, htn, anxiety, breast CA, kyphoplasty.   OT comments  Pt seen for OT tx. Agreeable, endorsing 5/10 R flank/hip/back pain at rest. Pt required grossly MIN A for bed mobility with VC for log roll sequencing, set up for seated grooming tasks with fair static sitting balance, MIN A for ADL transfers with VC for hand placement, and CGA-MIN A for lateral steps EOB with RW. Pt endorsed dizziness with coming upright which did not improve with time/repositioning. Pt endorses hx of vertigo. Dtr requesting medication to address. RN notified. Pt progressing towards goals, requiring less assist this date for ADL and mobility.       If plan is discharge home, recommend the following:  A lot of help with bathing/dressing/bathroom;Assistance with cooking/housework;Assist for transportation;Help with stairs or ramp for entrance;Direct supervision/assist for medications management;A lot of help with walking and/or transfers   Equipment Recommendations  Other (comment) (defer)    Recommendations for Other Services      Precautions / Restrictions Precautions Precautions: Fall Recall of Precautions/Restrictions: Intact Precaution/Restrictions Comments: No BP R UE Required Braces or Orthoses: Spinal Brace Spinal Brace: Lumbar corset;Other (comment) Spinal Brace Comments: LSO quick draw; Brace on when OOB; brace for comfort--can get up without her  brace Restrictions Weight Bearing Restrictions Per Provider Order: No Other Position/Activity Restrictions: Per neurosurgery note pt WBAT       Mobility Bed Mobility Overal bed mobility: Needs Assistance Bed Mobility: Rolling, Sidelying to Sit, Sit to Sidelying Rolling: Contact guard assist, Used rails Sidelying to sit: Min assist     Sit to sidelying: Min assist General bed mobility comments: VC for log roll, MIN A for trunk >sit, BLE >supine    Transfers Overall transfer level: Needs assistance Equipment used: Rolling walker (2 wheels) Transfers: Sit to/from Stand Sit to Stand: Mod assist, Min assist                 Balance Overall balance assessment: Needs assistance Sitting-balance support: No upper extremity supported, Feet supported Sitting balance-Leahy Scale: Fair     Standing balance support: Bilateral upper extremity supported, Reliant on assistive device for balance Standing balance-Leahy Scale: Poor                             ADL either performed or assessed with clinical judgement   ADL Overall ADL's : Needs assistance/impaired     Grooming: Sitting;Wash/dry face;Set up;Wash/dry hands Grooming Details (indicate cue type and reason): seated EOB with fair static sitting balance                                    Extremity/Trunk Assessment              Vision       Perception     Praxis     Communication Communication Communication: Impaired Factors Affecting Communication: Hearing impaired  Cognition Arousal: Alert Behavior During Therapy: WFL for tasks assessed/performed Cognition: No apparent impairments Difficult to assess due to: Hard of hearing/deaf                             Following commands: Intact        Cueing   Cueing Techniques: Verbal cues, Visual cues, Tactile cues  Exercises      Shoulder Instructions       General Comments      Pertinent Vitals/ Pain        Pain Assessment Pain Assessment: 0-10 Pain Score: 5  Pain Location: R sided back pain (upper thoracic to low back) Pain Descriptors / Indicators: Sharp, Shooting, Discomfort, Grimacing, Guarding Pain Intervention(s): Limited activity within patient's tolerance, Monitored during session, Premedicated before session, Repositioned  Home Living                                          Prior Functioning/Environment              Frequency  Min 2X/week        Progress Toward Goals  OT Goals(current goals can now be found in the care plan section)  Progress towards OT goals: Progressing toward goals  Acute Rehab OT Goals Patient Stated Goal: have less pain OT Goal Formulation: With patient/family Time For Goal Achievement: 02/03/24 Potential to Achieve Goals: Good  Plan      Co-evaluation                 AM-PAC OT "6 Clicks" Daily Activity     Outcome Measure   Help from another person eating meals?: None Help from another person taking care of personal grooming?: None Help from another person toileting, which includes using toliet, bedpan, or urinal?: A Lot Help from another person bathing (including washing, rinsing, drying)?: A Lot Help from another person to put on and taking off regular upper body clothing?: A Little Help from another person to put on and taking off regular lower body clothing?: A Lot 6 Click Score: 17    End of Session Equipment Utilized During Treatment: Rolling walker (2 wheels)  OT Visit Diagnosis: Unsteadiness on feet (R26.81);Repeated falls (R29.6);Muscle weakness (generalized) (M62.81)   Activity Tolerance Patient tolerated treatment well;Other (comment) (dizziness)   Patient Left in bed;with call bell/phone within reach;with bed alarm set;with family/visitor present   Nurse Communication Mobility status;Other (comment) (dizziness, dtr requesting meds for dizziness)        Time: 1610-9604 OT Time Calculation  (min): 24 min  Charges: OT General Charges $OT Visit: 1 Visit OT Treatments $Self Care/Home Management : 23-37 mins  Arman Filter., MPH, MS, OTR/L ascom [phone] 01/21/24, 3:25 PM

## 2024-01-21 NOTE — Plan of Care (Signed)
   Problem: Education: Goal: Knowledge of General Education information will improve Description Including pain rating scale, medication(s)/side effects and non-pharmacologic comfort measures Outcome: Progressing   Problem: Health Behavior/Discharge Planning: Goal: Ability to manage health-related needs will improve Outcome: Progressing

## 2024-01-21 NOTE — Care Management Important Message (Signed)
 Important Message  Patient Details  Name: Lindsey Barnes MRN: [id number] Date of Birth: 06/05/1933   Important Message Given:  Yes - Medicare IM     Cristela Blue, CMA 01/21/2024, 3:23 PM

## 2024-01-21 NOTE — TOC Initial Note (Addendum)
 Transition of Care [facility]) - Initial/Assessment Note    Patient Details  Name: Lindsey Barnes MRN: [id number] Date of Birth: 1933-07-20  Transition of [facility] CM/SW Contact:    Liliana Cline, LCSW Phone Number: 01/21/2024, 12:36 PM  Clinical Narrative:                 CSW met with patient's daughter Babette Relic outside of room on 1A. Tammy states they are agreeable to therapy recommendation for STR. They prefer [facility] (1st) or [facility] (2nd). Tammy states she has connections at [facility] - she made a call and they told her they can accept patient.   CSW called Sue Lush at [facility] - she confirms they can accept patient - the earliest they have a bed is Monday. Per MD, patient is medically ready - CSW called [employer] to start auth for [facility] and [facility] - left a VM requesting a return call.   2:20- Spoke with Tammy with [employer]. [facility] for SNF and EMS.  4:20- Patient was approved for SNF Auth # [id number] and EMS Auth # [id number]. Updated patient's daughter Babette Relic.  Expected Discharge Plan: Skilled Nursing Facility Barriers to Discharge: Continued Medical Work up   Patient Goals and CMS Choice Patient states their goals for this hospitalization and ongoing recovery are:: SNF for STR CMS Medicare.gov Compare Post Acute Care list provided to:: Patient Represenative (must comment) Choice offered to / list presented to : Adult Children      Expected Discharge Plan and Services       Living arrangements for the past 2 months: Single Family Home                                      Prior Living Arrangements/Services Living arrangements for the past 2 months: Single Family Home   Patient language and need for interpreter reviewed:: Yes        Need for Family Participation in Patient Care: Yes (Comment) Care giver support system in place?: Yes (comment)   Criminal Activity/Legal Involvement Pertinent to Current Situation/Hospitalization: No - Comment  as needed  Activities of Daily Living   ADL Screening (condition at time of admission) Independently performs ADLs?: No Does the patient have a NEW difficulty with bathing/dressing/toileting/self-feeding that is expected to last >3 days?: No Does the patient have a NEW difficulty with getting in/out of bed, walking, or climbing stairs that is expected to last >3 days?: Yes (Initiates electronic notice to provider for possible PT consult) Does the patient have a NEW difficulty with communication that is expected to last >3 days?: No Is the patient deaf or have difficulty hearing?: Yes Does the patient have difficulty seeing, even when wearing glasses/contacts?: No Does the patient have difficulty concentrating, remembering, or making decisions?: No  Permission Sought/Granted Permission sought to share information with : Oceanographer granted to share information with : Yes, Designer, fashion/clothing (by daughter Babette Relic)     Permission granted to share info w AGENCY: [facility]        Emotional Assessment         Alcohol / Substance Use: Not Applicable Psych Involvement: No (comment)  Admission diagnosis:  Difficulty walking [R26.2] Intractable pain [R52] Multiple closed anterior-posterior compression fractures of pelvis (HCC) [S32.82XA] Low back pain, unspecified back pain laterality, unspecified chronicity, unspecified whether sciatica present [M54.50] Compression  fracture of T12 vertebra with routine healing, subsequent encounter [S22.080D] Patient Active Problem List   Diagnosis Date Noted   Multiple closed anterior-posterior compression fractures of pelvis (HCC) 01/19/2024   Class 1 obesity due to excess calories with body mass index (BMI) of 30.0 to 30.9 in adult 01/08/2024   Syncope 01/05/2024   Suspected UTI 01/05/2024   Acute ischemic left ACA stroke (HCC) 01/18/2021   Acute CVA (cerebrovascular accident) (HCC) 01/17/2021    Depression, controlled 04/01/2018   Esophageal reflux 04/01/2018   Senile osteoporosis 04/01/2018   Acute cholecystitis 03/20/2018   Insomnia, unspecified 12/11/2014   Compression fracture of T12 vertebra (HCC) 01/13/2012   HTN (hypertension) 01/13/2012   Hyperlipidemia 01/13/2012   Low back pain 01/13/2012   PCP:  Dorothey Baseman, MD Pharmacy:   [facility] [address] [place] - [address] [place] [address] Phone: [phone] Fax: [phone]     Social Drivers of Health (SDOH) Social History: SDOH Screenings   Food Insecurity: No Food Insecurity (01/19/2024)  Housing: Low Risk  (01/19/2024)  Transportation Needs: No Transportation Needs (01/19/2024)  Utilities: Not At Risk (01/19/2024)  Financial Resource Strain: Patient Declined (04/30/2023)   Received from [facility]  Social Connections: Socially Isolated (01/19/2024)  Tobacco Use: Low Risk  (01/19/2024)   SDOH Interventions:     Readmission Risk Interventions    01/21/2024   12:35 PM  Readmission Risk Prevention Plan  Post Dischage Appt Complete  Medication Screening Complete  Transportation Screening Complete

## 2024-01-21 NOTE — Progress Notes (Signed)
 Physical Therapy Treatment Patient Details Name: Lindsey Barnes MRN: [id number] DOB: Aug 25, 1933 Today's Date: 01/21/2024   History of Present Illness Pt is an 89 y.o. female presenting to hospital 01/19/24 with c/o chest pain, back pain, and SOB that started that morning; numbness in finger tips that resolved; has tremor at baseline from previous stroke.  Imaging showing possible acute/recent nondisplaced sacral fx; chronic compression fx's of T12 and L2 with 4mm bony retropulsion.  PMH includes h/o CVA, acid reflux, chronic distolic HF, compression fx T12, htn, anxiety, breast CA, kyphoplasty.    PT Comments  Pt resting in bed upon PT arrival; pt agreeable to therapy.  Pt reporting R back pain 4-5/10 at rest beginning of session and 3-4/10 end of session at rest; pt received recent pain medication; nurse updated on pt's pain.  During session pt was min assist with bed mobility and min assist with transfers with RW use.  Limited activity d/t pt c/o "swimmy-headedness" in standing and needing to lay down (pt reports h/o dizziness/vertigo since childhood; pt requesting meclizine--nurse notified).  Will continue to focus on strengthening and progressive functional mobility per pt tolerance.   If plan is discharge home, recommend the following: A lot of help with walking and/or transfers;A lot of help with bathing/dressing/bathroom;Assistance with cooking/housework;Assist for transportation;Help with stairs or ramp for entrance   Can travel by private vehicle     No  Equipment Recommendations  Rolling walker (2 wheels)    Recommendations for Other Services       Precautions / Restrictions Precautions Precautions: Fall Recall of Precautions/Restrictions: Intact Precaution/Restrictions Comments: No BP R UE Required Braces or Orthoses: Spinal Brace Spinal Brace: Lumbar corset;Other (comment) Spinal Brace Comments: LSO quick draw; Brace on when OOB; brace for comfort--can get up without her  brace Restrictions Weight Bearing Restrictions Per Provider Order: No Other Position/Activity Restrictions: Per neurosurgery note pt WBAT     Mobility  Bed Mobility Overal bed mobility: Needs Assistance Bed Mobility: Rolling, Sidelying to Sit, Sit to Sidelying Rolling: Supervision, Used rails Sidelying to sit: Min assist (assist for trunk)     Sit to sidelying: Min assist (assist for LE's) General bed mobility comments: vc's for logrolling technique    Transfers Overall transfer level: Needs assistance Equipment used: Rolling walker (2 wheels) Transfers: Sit to/from Stand Sit to Stand: Min assist   Step pivot transfers: Min assist       General transfer comment: min assist to stand from bed x1 trial and from [facility] x1 trial; stand step turn bed to/from [facility] with RW use min assist    Ambulation/Gait               General Gait Details: Deferred d/t pt c/o being very "swimmy-headed" in standing   Stairs             Wheelchair Mobility     Tilt Bed    Modified Rankin (Stroke Patients Only)       Balance Overall balance assessment: Needs assistance Sitting-balance support: Feet supported, Bilateral upper extremity supported Sitting balance-Leahy Scale: Fair Sitting balance - Comments: steady static sitting   Standing balance support: Bilateral upper extremity supported, Reliant on assistive device for balance Standing balance-Leahy Scale: Poor Standing balance comment: assist for safety (d/t pt feeling "swimmy-headed in standing)                            Communication Communication Communication: Impaired Factors Affecting Communication: Hearing  impaired  Cognition Arousal: Alert Behavior During Therapy: WFL for tasks assessed/performed, Anxious   PT - Cognitive impairments: No apparent impairments                         Following commands: Intact      Cueing Cueing Techniques: Verbal cues, Visual cues, Tactile cues   Exercises      General Comments  Pt agreeable to PT session.  Pt's daughter present but stepped out during session.      Pertinent Vitals/Pain Pain Assessment Pain Assessment: 0-10 Pain Score: 4  Pain Location: R sided back pain (upper thoracic to low back) Pain Descriptors / Indicators: Sharp, Shooting, Discomfort, Grimacing, Guarding Pain Intervention(s): Limited activity within patient's tolerance, Monitored during session, Premedicated before session, Repositioned Vitals (HR and SpO2 on 2 L via nasal cannula) stable post activity during treatment session.    Home Living                          Prior Function            PT Goals (current goals can now be found in the care plan section) Acute Rehab PT Goals Patient Stated Goal: to improve pain and walking PT Goal Formulation: With patient/family Time For Goal Achievement: 02/03/24 Potential to Achieve Goals: Good Progress towards PT goals: Progressing toward goals    Frequency    Min 2X/week      PT Plan      Co-evaluation              AM-PAC PT "6 Clicks" Mobility   Outcome Measure  Help needed turning from your back to your side while in a flat bed without using bedrails?: A Little Help needed moving from lying on your back to sitting on the side of a flat bed without using bedrails?: A Little Help needed moving to and from a bed to a chair (including a wheelchair)?: A Little Help needed standing up from a chair using your arms (e.g., wheelchair or bedside chair)?: A Little Help needed to walk in hospital room?: A Lot Help needed climbing 3-5 steps with a railing? : Total 6 Click Score: 15    End of Session Equipment Utilized During Treatment: Gait belt;Back brace Activity Tolerance: Other (comment) (limited d/t pt feeling "swimmy-headed" with standing activities) Patient left: in bed;with call bell/phone within reach;with bed alarm set;with family/visitor present Nurse Communication:  Mobility status;Precautions;Other (comment) (Pt's request for meclizine d/t dizziness; pt's pain status) PT Visit Diagnosis: Other abnormalities of gait and mobility (R26.89);Muscle weakness (generalized) (M62.81);History of falling (Z91.81);Pain Pain - Right/Left: Right Pain - part of body:  (back)     Time: 1322-1350 PT Time Calculation (min) (ACUTE ONLY): 28 min  Charges:    $Therapeutic Activity: 23-37 mins PT General Charges $$ ACUTE PT VISIT: 1 Visit                     Hendricks Limes, PT 01/21/24, 2:24 PM

## 2024-01-21 NOTE — Progress Notes (Signed)
 Progress Note   Patient: Lindsey Barnes ZOX:[id number] DOB: 30-Oct-1932 DOA: 01/19/2024     2 DOS: the patient was seen and examined on 01/21/2024     Brief hospital course:   Rayvon Brandvold is a 89 y.o. female with medical history significant of Hypertension, GERD, chronic back pain, history of breast cancer, TIA, who comes seen with complaints of back pain with intensity 10/10.  According to patient's daughter present at bedside patient nearly had a syncopal episode today at home.  She initially started complaining of some associated numbness in bilateral hands however this had resolved by the time patient was being seen.  She complained of some abdominal pain mostly in the epigastric region.  Denies nausea vomiting chest pain or urinary complaints.  Patient was recently admitted on 01/05/2024 and was found to have some old compression fracture and at that time managed with pain medication and discharged home however according to patient's daughter patient has continued to be in pain and still not able to walk with her regular walker and therefore brought in for further management. ED course: Temperature 97.6 respiratory rate 30 pulse 74 blood pressure 146/84 saturating on 2 L; 97%.  Given above concerns hospitalist consulted for contacted to admit patient for further management     Assessment and Plan: Acute on chronic intractable back pain CT scan revealing findings of old compression fracture noted at T12 and L2 Patient currently on pain medication I have added MS Contin for base of pain control Continue as needed Dilaudid, tramadol based on pain scale I have discussed with neurosurgeon and patient does not want to pursue any surgical intervention and as such has declined to undergo MRI. Patient pending rehab   GERD Continue Protonix   Hyperlipidemia Continue statin    HTN (hypertension) Continue home antihypertensives   DVT prophylaxis-continue Lovenox    Advance  Care Planning:   Code Status: Prior DNR   Consults: Neurosurgery   Family Communication: Discussed with patient daughter at bedside     Subjective:  Patient seen and examined at bedside this morning Denies nausea vomiting  No chest pain She still complains of lower back pain Pain medication adjusted Has been seen by PT OT who has recommended rehab According to [facility] manager patient will be accepted at [facility] on Monday   Physical Exam:   Appearance: She is normal weight.  HENT:     Head: Normocephalic and atraumatic.     Nose: Nose normal.     Mouth/Throat:     Mouth: Mucous membranes are moist.  Eyes:     Pupils: Pupils are equal, round, and reactive to light.  Cardiovascular:     Rate and Rhythm: Normal rate and regular rhythm.  Pulmonary:     Effort: Pulmonary effort is normal.  Abdominal:     General: Bowel sounds are normal.  Musculoskeletal:        General: Normal range of motion.  Skin:    General: Skin is warm and dry.  Neurological:     General: No focal deficit present.  Psychiatric:        Mood and Affect: Mood normal.      Data Reviewed:   Vitals:   01/20/24 1515 01/20/24 2119 01/21/24 0312 01/21/24 0826  BP: 131/77 138/69 129/71 122/66  Pulse: 81 88 88 81  Resp: 16 18 17 18   Temp: 99.7 F (37.6 C) 99.9 F (37.7 C) 98.3 F (36.8 C) 98.6 F (37 C)  TempSrc:  Oral    SpO2: 93% 94% 92% 96%  Weight:      Height:          Latest Ref Rng & Units 01/19/2024    9:48 AM 01/08/2024    5:14 AM 01/07/2024    4:39 AM  CBC  WBC 4.0 - 10.5 K/uL 6.4  3.9  4.9   Hemoglobin 12.0 - 15.0 g/dL 91.4  78.2  95.6   Hematocrit 36.0 - 46.0 % 38.9  34.0  36.5   Platelets 150 - 400 K/uL 280  139  151        Latest Ref Rng & Units 01/19/2024    9:48 AM 01/08/2024    3:58 AM 01/07/2024    4:39 AM  BMP  Glucose 70 - 99 mg/dL 213  086  578   BUN 8 - 23 mg/dL 21  15  14    Creatinine 0.44 - 1.00 mg/dL 4.69  6.29  5.28   Sodium 135 - 145 mmol/L 136  140  140    Potassium 3.5 - 5.1 mmol/L 3.9  4.2  3.3   Chloride 98 - 111 mmol/L 103  108  108   CO2 22 - 32 mmol/L 27  26  24    Calcium 8.9 - 10.3 mg/dL 8.6  8.1  8.2      Author: Loyce Dys, MD 01/21/2024 3:18 PM  For on call review [email or web address].

## 2024-01-21 NOTE — [place] FL2 (Signed)
 [place] MEDICAID FL2 LEVEL OF CARE FORM     IDENTIFICATION  Patient Name: Lindsey Barnes Birthdate: 30-Jun-1933 Sex: female Admission Date (Current Location): 01/19/2024  [facility] and [place] Number:  Chiropodist and Address:  [facility], [address] [place], [place] [address]      Provider Number: [id number]  Attending Physician Name and Address:  Loyce Dys, MD  Relative Name and Phone Number:  Candi Leash (Daughter)  [phone] (Work Phone)    Current Level of Care: Hospital Recommended Level of Care: Skilled Nursing Facility Prior Approval Number:    Date Approved/Denied:   PASRR Number: pending  Discharge Plan:      Current Diagnoses: Patient Active Problem List   Diagnosis Date Noted   Multiple closed anterior-posterior compression fractures of pelvis (HCC) 01/19/2024   Class 1 obesity due to excess calories with body mass index (BMI) of 30.0 to 30.9 in adult 01/08/2024   Syncope 01/05/2024   Suspected UTI 01/05/2024   Acute ischemic left ACA stroke (HCC) 01/18/2021   Acute CVA (cerebrovascular accident) (HCC) 01/17/2021   Depression, controlled 04/01/2018   Esophageal reflux 04/01/2018   Senile osteoporosis 04/01/2018   Acute cholecystitis 03/20/2018   Insomnia, unspecified 12/11/2014   Compression fracture of T12 vertebra (HCC) 01/13/2012   HTN (hypertension) 01/13/2012   Hyperlipidemia 01/13/2012   Low back pain 01/13/2012    Orientation RESPIRATION BLADDER Height & Weight     Self, Time, Place  Normal Continent Weight: 189 lb 8 oz (86 kg) Height:  5\' 8"  (172.7 cm)  BEHAVIORAL SYMPTOMS/MOOD NEUROLOGICAL BOWEL NUTRITION STATUS      Continent Diet  AMBULATORY STATUS COMMUNICATION OF NEEDS Skin   Limited Assist Verbally Bruising                       Personal Care Assistance Level of Assistance  Bathing, Feeding, Dressing Bathing Assistance: Limited assistance Feeding assistance: Limited  assistance Dressing Assistance: Limited assistance     Functional Limitations Info  Sight, Hearing Sight Info: Impaired (glasses) Hearing Info: Impaired (hearing aides)      SPECIAL CARE FACTORS FREQUENCY  PT (By licensed PT), OT (By licensed OT)     PT Frequency: 5 times per week OT Frequency: 5 times per week            Contractures      Additional Factors Info  Code Status, Allergies Code Status Info: Limited: Do not attempt resuscitation (DNR) -DNR-LIMITED -Do Not Intubate/DNI Allergies Info: Amoxicillin, Aspirin, Benadryl (Diphenhydramine Hcl)           Current Medications (01/21/2024):  This is the current hospital active medication list Current Facility-Administered Medications  Medication Dose Route Frequency Provider Last Rate Last Admin   acetaminophen (TYLENOL) tablet 650 mg  650 mg Oral Q6H PRN Loyce Dys, MD       atorvastatin (LIPITOR) tablet 40 mg  40 mg Oral Daily Rosezetta Schlatter T, MD   40 mg at 01/21/24 0915   clopidogrel (PLAVIX) tablet 75 mg  75 mg Oral Daily Rosezetta Schlatter T, MD   75 mg at 01/21/24 0916   docusate sodium (COLACE) capsule 100 mg  100 mg Oral BID Rosezetta Schlatter T, MD   100 mg at 01/21/24 0915   enoxaparin (LOVENOX) injection 47.5 mg  0.5 mg/kg Subcutaneous Q24H Rosezetta Schlatter T, MD   47.5 mg at 01/20/24 1838   fluticasone (FLONASE) 50 MCG/ACT nasal spray 2 spray  2 spray Each Nare Daily Loyce Dys, MD   2 spray at 01/21/24 0803   hydrochlorothiazide (HYDRODIURIL) tablet 25 mg  25 mg Oral Daily Rosezetta Schlatter T, MD   25 mg at 01/21/24 0915   HYDROmorphone (DILAUDID) injection 1 mg  1 mg Intravenous Q3H PRN Rosezetta Schlatter T, MD   1 mg at 01/20/24 1532   meclizine (ANTIVERT) tablet 25 mg  25 mg Oral TID PRN Loyce Dys, MD       morphine (MS CONTIN) 12 hr tablet 30 mg  30 mg Oral Q12H Rosezetta Schlatter T, MD       ondansetron [facility]) injection 4 mg  4 mg Intravenous Q6H PRN Loyce Dys, MD       polyethylene glycol (MIRALAX / GLYCOLAX) packet  17 g  17 g Oral Daily Rosezetta Schlatter T, MD   17 g at 01/21/24 0916   sertraline (ZOLOFT) tablet 50 mg  50 mg Oral BID Rosezetta Schlatter T, MD   50 mg at 01/21/24 0915   traMADol (ULTRAM) tablet 25-50 mg  25-50 mg Oral Q6H PRN Loyce Dys, MD   25 mg at 01/21/24 0755   traZODone (DESYREL) tablet 50-100 mg  50-100 mg Oral QHS Loyce Dys, MD   50 mg at 01/20/24 2218     Discharge Medications: Please see discharge summary for a list of discharge medications.  Relevant Imaging Results:  Relevant Lab Results:   Additional Information SS #: [id number]  Jaymon Dudek E Yoshi Mancillas, LCSW

## 2024-01-22 DIAGNOSIS — S32020G Wedge compression fracture of second lumbar vertebra, subsequent encounter for fracture with delayed healing: Secondary | ICD-10-CM | POA: Diagnosis not present

## 2024-01-22 LAB — CBC WITH DIFFERENTIAL/PLATELET
Abs Immature Granulocytes: 0.02 10*3/uL (ref 0.00–0.07)
Basophils Absolute: 0 10*3/uL (ref 0.0–0.1)
Basophils Relative: 0 %
Eosinophils Absolute: 0.2 10*3/uL (ref 0.0–0.5)
Eosinophils Relative: 3 %
HCT: 37.9 % (ref 36.0–46.0)
Hemoglobin: 12.8 g/dL (ref 12.0–15.0)
Immature Granulocytes: 0 %
Lymphocytes Relative: 11 %
Lymphs Abs: 0.8 10*3/uL (ref 0.7–4.0)
MCH: 31.3 pg (ref 26.0–34.0)
MCHC: 33.8 g/dL (ref 30.0–36.0)
MCV: 92.7 fL (ref 80.0–100.0)
Monocytes Absolute: 0.8 10*3/uL (ref 0.1–1.0)
Monocytes Relative: 11 %
Neutro Abs: 5 10*3/uL (ref 1.7–7.7)
Neutrophils Relative %: 75 %
Platelets: 243 10*3/uL (ref 150–400)
RBC: 4.09 MIL/uL (ref 3.87–5.11)
RDW: 13.8 % (ref 11.5–15.5)
WBC: 6.7 10*3/uL (ref 4.0–10.5)
nRBC: 0 % (ref 0.0–0.2)

## 2024-01-22 LAB — BASIC METABOLIC PANEL WITH GFR
Anion gap: 8 (ref 5–15)
BUN: 21 mg/dL (ref 8–23)
CO2: 32 mmol/L (ref 22–32)
Calcium: 7.7 mg/dL — ABNORMAL LOW (ref 8.9–10.3)
Chloride: 96 mmol/L — ABNORMAL LOW (ref 98–111)
Creatinine, Ser: 0.73 mg/dL (ref 0.44–1.00)
GFR, Estimated: >60 mL/min (ref >60)
Glucose, Bld: 130 mg/dL — ABNORMAL HIGH (ref 70–99)
Potassium: 2.8 mmol/L — ABNORMAL LOW (ref 3.5–5.1)
Sodium: 136 mmol/L (ref 135–145)

## 2024-01-22 LAB — MAGNESIUM: Magnesium: 2.3 mg/dL (ref 1.7–2.4)

## 2024-01-22 MED ORDER — POTASSIUM CHLORIDE 20 MEQ PO PACK
40.0000 meq | PACK | ORAL | Status: AC
  Administered 2024-01-22 (×2): 40 meq via ORAL
  Filled 2024-01-22 (×2): qty 2

## 2024-01-22 MED ORDER — POTASSIUM CHLORIDE CRYS ER 20 MEQ PO TBCR
40.0000 meq | EXTENDED_RELEASE_TABLET | ORAL | Status: DC
Start: 2024-01-22 — End: 2024-01-22
  Administered 2024-01-22: 40 meq via ORAL
  Filled 2024-01-22: qty 2

## 2024-01-22 MED ORDER — SALINE SPRAY 0.65 % NA SOLN
1.0000 | NASAL | Status: DC | PRN
  Filled 2024-01-22: qty 44

## 2024-01-22 MED ORDER — POTASSIUM CHLORIDE 20 MEQ PO PACK: 40.0000 meq | PACK | ORAL | Status: DC

## 2024-01-22 NOTE — Plan of Care (Signed)
   Problem: Nutrition: Goal: Adequate nutrition will be maintained Outcome: Progressing   Problem: Elimination: Goal: Will not experience complications related to urinary retention Outcome: Progressing

## 2024-01-22 NOTE — Progress Notes (Addendum)
 Progress Note   Patient: Lindsey Barnes [id number]:[id number] DOB: May 01, 1933 DOA: 01/19/2024     3 DOS: the patient was seen and examined on 01/22/2024      Brief hospital course:   Lindsey Barnes is a 89 y.o. female with medical history significant of Hypertension, GERD, chronic back pain, history of breast cancer, TIA, who comes seen with complaints of back pain with intensity 10/10.  According to patient's daughter present at bedside patient nearly had a syncopal episode today at home.  She initially started complaining of some associated numbness in bilateral hands however this had resolved by the time patient was being seen.  She complained of some abdominal pain mostly in the epigastric region.  Denies nausea vomiting chest pain or urinary complaints.  Patient was recently admitted on 01/05/2024 and was found to have some old compression fracture and at that time managed with pain medication and discharged home however according to patient's daughter patient has continued to be in pain and still not able to walk with her regular walker and therefore brought in for further management. ED course: Temperature 97.6 respiratory rate 30 pulse 74 blood pressure 146/84 saturating on 2 L; 97%.  Given above concerns hospitalist consulted for contacted to admit patient for further management     Assessment and Plan: Acute on chronic intractable back pain CT scan revealing findings of old compression fracture noted at T12 and L2 Patient currently on pain medication Continue MS Contin for base of pain control Continue as needed Dilaudid, tramadol based on pain scale I have discussed with neurosurgeon and patient does not want to pursue any surgical intervention and as such has declined to undergo MRI. Patient pending rehab   Hypokalemia-continue repletion and monitoring  GERD Continue Protonix   Hyperlipidemia Continue statin    HTN (hypertension) Continue home antihypertensives    DVT prophylaxis-continue Lovenox    Advance Care Planning:   Code Status: Prior DNR   Consults: Neurosurgery   Family Communication: Discussed with patient daughter at bedside     Subjective:  Patient seen and examined at bedside this morning She admits to improvement into her pain level Denies nausea vomiting abdominal pain or chest pain According to [facility] manager patient will be accepted at [facility] on Monday   Physical Exam:   Appearance: She is normal weight.  HENT:     Head: Normocephalic and atraumatic.     Nose: Nose normal.     Mouth/Throat:     Mouth: Mucous membranes are moist.  Eyes:     Pupils: Pupils are equal, round, and reactive to light.  Cardiovascular:     Rate and Rhythm: Normal rate and regular rhythm.  Pulmonary:     Effort: Pulmonary effort is normal.  Abdominal:     General: Bowel sounds are normal.  Musculoskeletal:        General: Normal range of motion.  Skin:    General: Skin is warm and dry.  Neurological:     General: No focal deficit present.  Psychiatric:        Mood and Affect: Mood normal.      Data Reviewed:    Vitals:   01/22/24 0400 01/22/24 0401 01/22/24 0817 01/22/24 1359  BP: (!) 142/78  (!) 148/78 129/61  Pulse: 86 85 85 83  Resp: 18  16 18   Temp: 97.8 F (36.6 C)  97.6 F (36.4 C) (!) 97.5 F (36.4 C)  TempSrc: Oral   Oral  SpO2:  93% 95% 93%  Weight:      Height:          Latest Ref Rng & Units 01/22/2024    5:40 AM 01/19/2024    9:48 AM 01/08/2024    5:14 AM  CBC  WBC 4.0 - 10.5 K/uL 6.7  6.4  3.9   Hemoglobin 12.0 - 15.0 g/dL 16.1  09.6  04.5   Hematocrit 36.0 - 46.0 % 37.9  38.9  34.0   Platelets 150 - 400 K/uL 243  280  139        Latest Ref Rng & Units 01/22/2024    5:40 AM 01/19/2024    9:48 AM 01/08/2024    3:58 AM  BMP  Glucose 70 - 99 mg/dL 409  811  914   BUN 8 - 23 mg/dL 21  21  15    Creatinine 0.44 - 1.00 mg/dL 7.82  9.56  2.13   Sodium 135 - 145 mmol/L 136  136  140   Potassium 3.5 - 5.1  mmol/L 2.8  3.9  4.2   Chloride 98 - 111 mmol/L 96  103  108   CO2 22 - 32 mmol/L 32  27  26   Calcium 8.9 - 10.3 mg/dL 7.7  8.6  8.1      Author: Ezzard Holms, MD 01/22/2024 5:54 PM  For on call review [email or web address].

## 2024-01-23 DIAGNOSIS — S32020G Wedge compression fracture of second lumbar vertebra, subsequent encounter for fracture with delayed healing: Secondary | ICD-10-CM | POA: Diagnosis not present

## 2024-01-23 LAB — CBC WITH DIFFERENTIAL/PLATELET
Abs Immature Granulocytes: 0.01 K/uL (ref 0.00–0.07)
Basophils Absolute: 0 K/uL (ref 0.0–0.1)
Basophils Relative: 1 %
Eosinophils Absolute: 0.2 K/uL (ref 0.0–0.5)
Eosinophils Relative: 5 %
HCT: 38.6 % (ref 36.0–46.0)
Hemoglobin: 12.5 g/dL (ref 12.0–15.0)
Immature Granulocytes: 0 %
Lymphocytes Relative: 21 %
Lymphs Abs: 1.1 K/uL (ref 0.7–4.0)
MCH: 31.2 pg (ref 26.0–34.0)
MCHC: 32.4 g/dL (ref 30.0–36.0)
MCV: 96.3 fL (ref 80.0–100.0)
Monocytes Absolute: 0.7 K/uL (ref 0.1–1.0)
Monocytes Relative: 14 %
Neutro Abs: 3.2 K/uL (ref 1.7–7.7)
Neutrophils Relative %: 59 %
Platelets: 223 K/uL (ref 150–400)
RBC: 4.01 MIL/uL (ref 3.87–5.11)
RDW: 13.8 % (ref 11.5–15.5)
WBC: 5.3 K/uL (ref 4.0–10.5)
nRBC: 0 % (ref 0.0–0.2)

## 2024-01-23 LAB — BASIC METABOLIC PANEL WITH GFR
Anion gap: 8 (ref 5–15)
BUN: 21 mg/dL (ref 8–23)
CO2: 31 mmol/L (ref 22–32)
Calcium: 8 mg/dL — ABNORMAL LOW (ref 8.9–10.3)
Chloride: 98 mmol/L (ref 98–111)
Creatinine, Ser: 0.71 mg/dL (ref 0.44–1.00)
GFR, Estimated: >60 mL/min (ref >60)
Glucose, Bld: 116 mg/dL — ABNORMAL HIGH (ref 70–99)
Potassium: 4.3 mmol/L (ref 3.5–5.1)
Sodium: 137 mmol/L (ref 135–145)

## 2024-01-23 NOTE — Plan of Care (Signed)
   Problem: Elimination: Goal: Will not experience complications related to bowel motility Outcome: Progressing   Problem: Safety: Goal: Ability to remain free from injury will improve Outcome: Progressing

## 2024-01-23 NOTE — Plan of Care (Signed)
  Problem: Elimination: Goal: Will not experience complications related to bowel motility Outcome: Progressing   Problem: Elimination: Goal: Will not experience complications related to urinary retention Outcome: Progressing   Problem: Health Behavior/Discharge Planning: Goal: Ability to manage health-related needs will improve Outcome: Progressing

## 2024-01-23 NOTE — Progress Notes (Signed)
 Progress Note   Patient: Lindsey Barnes ZOX:[id number] DOB: Mar 28, 1933 DOA: 01/19/2024     4 DOS: the patient was seen and examined on 01/23/2024      Brief hospital course:   Lindsey Barnes is a 89 y.o. female with medical history significant of Hypertension, GERD, chronic back pain, history of breast cancer, TIA, who comes seen with complaints of back pain with intensity 10/10.  According to patient's daughter present at bedside patient nearly had a syncopal episode today at home.  She initially started complaining of some associated numbness in bilateral hands however this had resolved by the time patient was being seen.  She complained of some abdominal pain mostly in the epigastric region.  Denies nausea vomiting chest pain or urinary complaints.  Patient was recently admitted on 01/05/2024 and was found to have some old compression fracture and at that time managed with pain medication and discharged home however according to patient's daughter patient has continued to be in pain and still not able to walk with her regular walker and therefore brought in for further management. ED course: Temperature 97.6 respiratory rate 30 pulse 74 blood pressure 146/84 saturating on 2 L; 97%.  Given above concerns hospitalist consulted for contacted to admit patient for further management     Assessment and Plan: Acute on chronic intractable back pain CT scan revealing findings of old compression fracture noted at T12 and L2 Patient currently on pain medication Continue MS Contin for base of pain control Continue as needed Dilaudid, tramadol based on pain scale I have discussed with neurosurgeon and patient does not want to pursue any surgical intervention and as such has declined to undergo MRI. Rehab facility bed will be available tomorrow for discharge   Hypokalemia-continue repletion and monitoring   GERD Continue Protonix   Hyperlipidemia Continue statin therapy     HTN  (hypertension) Continue home antihypertensives   DVT prophylaxis-continue Lovenox    Advance Care Planning:   Code Status: Prior DNR   Consults: Neurosurgery   Family Communication: Discussed with patient daughter at bedside     Subjective:  Patient seen and examined at bedside this morning She admits to improvement into her pain level She denies nausea vomiting abdominal pain or chest pain I discussed with the case manager and patient will be accepted at [facility] tomorrow   Physical Exam:   Appearance: She is normal weight.  HENT:     Head: Normocephalic and atraumatic.     Nose: Nose normal.     Mouth/Throat:     Mouth: Mucous membranes are moist.  Eyes:     Pupils: Pupils are equal, round, and reactive to light.  Cardiovascular:     Rate and Rhythm: Normal rate and regular rhythm.  Pulmonary:     Effort: Pulmonary effort is normal.  Abdominal:     General: Bowel sounds are normal.  Musculoskeletal:        General: Normal range of motion.  Skin:    General: Skin is warm and dry.  Neurological:     General: No focal deficit present.  Psychiatric:        Mood and Affect: Mood normal.      Data Reviewed:  Vitals:   01/22/24 1359 01/22/24 2145 01/23/24 0440 01/23/24 0838  BP: 129/61 116/64 (!) 153/76 (!) 142/69  Pulse: 83 95 92 87  Resp: 18 17 20 17   Temp: (!) 97.5 F (36.4 C) 98.2 F (36.8 C) 98.5 F (36.9 C)  98.1 F (36.7 C)  TempSrc: Oral   Oral  SpO2: 93% 97% 98% 94%  Weight:      Height:          Latest Ref Rng & Units 01/23/2024    4:21 AM 01/22/2024    5:40 AM 01/19/2024    9:48 AM  CBC  WBC 4.0 - 10.5 K/uL 5.3  6.7  6.4   Hemoglobin 12.0 - 15.0 g/dL 16.1  09.6  04.5   Hematocrit 36.0 - 46.0 % 38.6  37.9  38.9   Platelets 150 - 400 K/uL 223  243  280       Latest Ref Rng & Units 01/23/2024    4:21 AM 01/22/2024    5:40 AM 01/19/2024    9:48 AM  BMP  Glucose 70 - 99 mg/dL 409  811  914   BUN 8 - 23 mg/dL 21  21  21    Creatinine 0.44 - 1.00  mg/dL 7.82  9.56  2.13   Sodium 135 - 145 mmol/L 137  136  136   Potassium 3.5 - 5.1 mmol/L 4.3  2.8  3.9   Chloride 98 - 111 mmol/L 98  96  103   CO2 22 - 32 mmol/L 31  32  27   Calcium 8.9 - 10.3 mg/dL 8.0  7.7  8.6      Author: Ezzard Holms, MD 01/23/2024 2:39 PM  For on call review [email or web address].

## 2024-01-24 DIAGNOSIS — S32020G Wedge compression fracture of second lumbar vertebra, subsequent encounter for fracture with delayed healing: Secondary | ICD-10-CM | POA: Diagnosis not present

## 2024-01-24 LAB — CBC WITH DIFFERENTIAL/PLATELET
Abs Immature Granulocytes: 0.03 10*3/uL (ref 0.00–0.07)
Basophils Absolute: 0 10*3/uL (ref 0.0–0.1)
Basophils Relative: 0 %
Eosinophils Absolute: 0.3 10*3/uL (ref 0.0–0.5)
Eosinophils Relative: 7 %
HCT: 37.9 % (ref 36.0–46.0)
Hemoglobin: 12.3 g/dL (ref 12.0–15.0)
Immature Granulocytes: 1 %
Lymphocytes Relative: 16 %
Lymphs Abs: 0.8 10*3/uL (ref 0.7–4.0)
MCH: 30.8 pg (ref 26.0–34.0)
MCHC: 32.5 g/dL (ref 30.0–36.0)
MCV: 95 fL (ref 80.0–100.0)
Monocytes Absolute: 0.5 10*3/uL (ref 0.1–1.0)
Monocytes Relative: 11 %
Neutro Abs: 3.1 10*3/uL (ref 1.7–7.7)
Neutrophils Relative %: 65 %
Platelets: 223 10*3/uL (ref 150–400)
RBC: 3.99 MIL/uL (ref 3.87–5.11)
RDW: 13.5 % (ref 11.5–15.5)
WBC: 4.8 10*3/uL (ref 4.0–10.5)
nRBC: 0 % (ref 0.0–0.2)

## 2024-01-24 LAB — BASIC METABOLIC PANEL WITH GFR
Anion gap: 8 (ref 5–15)
BUN: 21 mg/dL (ref 8–23)
CO2: 32 mmol/L (ref 22–32)
Calcium: 8.1 mg/dL — ABNORMAL LOW (ref 8.9–10.3)
Chloride: 95 mmol/L — ABNORMAL LOW (ref 98–111)
Creatinine, Ser: 0.66 mg/dL (ref 0.44–1.00)
GFR, Estimated: >60 mL/min (ref >60)
Glucose, Bld: 114 mg/dL — ABNORMAL HIGH (ref 70–99)
Potassium: 4 mmol/L (ref 3.5–5.1)
Sodium: 135 mmol/L (ref 135–145)

## 2024-01-24 MED ORDER — MORPHINE SULFATE ER 30 MG PO TBCR: 30.0000 mg | EXTENDED_RELEASE_TABLET | Freq: Two times a day (BID) | ORAL | 0 refills | Status: AC

## 2024-01-24 MED ORDER — ACETAMINOPHEN 325 MG PO TABS: 650.0000 mg | ORAL_TABLET | Freq: Four times a day (QID) | ORAL | Status: AC | PRN

## 2024-01-24 MED ORDER — POLYETHYLENE GLYCOL 3350 17 G PO PACK: 17.0000 g | PACK | Freq: Every day | ORAL | Status: DC

## 2024-01-24 MED ORDER — TRAMADOL HCL 50 MG PO TABS: 25.0000 mg | ORAL_TABLET | Freq: Four times a day (QID) | ORAL | 0 refills | Status: AC | PRN

## 2024-01-24 NOTE — Discharge Summary (Signed)
 Physician Discharge Summary   Patient: Lindsey Barnes MRN: [id number] DOB: Jan 28, 1933  Admit date:     01/19/2024  Discharge date: 01/27/2024  Discharge Physician: Ezzard Holms   PCP: Rory Collard, MD   Recommendations at discharge:  Follow-up with primary care physician and neurosurgery  Discharge Diagnoses: Acute on chronic intractable back pain Hypokalemia-continue repletion and monitoring GERD Hyperlipidemia HTN (hypertension)   Hospital Course: Lindsey Barnes is a 89 y.o. female with medical history significant of Hypertension, GERD, chronic back pain, history of breast cancer, TIA, who comes seen with complaints of back pain with intensity 10/10.  According to patient's daughter present at bedside patient nearly had a syncopal episode on the day of presentation at home.  She initially started complaining of some associated numbness in bilateral hands however this had resolved by the time patient was being seen.  She complained of some abdominal pain mostly in the epigastric region.  Patient was recently admitted on 01/05/2024 and was found to have some old compression fracture and at that time managed with pain medication and discharged home however according to patient's daughter patient has continued to be in pain and still not able to walk with her regular walker and therefore brought in for further management. Neurosurgeon was consulted who recommended medical management as well as physical therapy and pain regimen.  TLSO was prescribed for patient during mobility.  Neurosurgeon recommended follow-up with orthopedic physicians in [place] at discharge.  PT OT recommended rehab placement for which family as well as patient have agreed.  Her pain level is now improved on current pain regiment and therefore being discharged to rehab today.  Consultants: Neurosurgeon Procedures performed: None Disposition: Skilled nursing facility Diet recommendation:  Cardiac  diet DISCHARGE MEDICATION: Allergies as of 01/24/2024       Reactions   Amoxicillin Swelling   Blisters.   Aspirin Other (See Comments)   Stomach ulcers.   Benadryl [diphenhydramine Hcl] Other (See Comments)   Hyperactive.        Medication List     TAKE these medications    acetaminophen 325 MG tablet Commonly known as: TYLENOL Take 2 tablets (650 mg total) by mouth every 6 (six) hours as needed for mild pain (pain score 1-3), fever or headache.   atorvastatin 40 MG tablet Commonly known as: LIPITOR Take 1 tablet (40 mg total) by mouth daily.   cholecalciferol 1000 units tablet Commonly known as: VITAMIN D Take 2,000 Units by mouth daily.   clopidogrel 75 MG tablet Commonly known as: PLAVIX Take 1 tablet (75 mg total) by mouth daily.   cyanocobalamin 500 MCG tablet Commonly known as: VITAMIN B12 Take 500 mcg by mouth daily.   diazepam 2 MG tablet Commonly known as: VALIUM Take 2 mg by mouth every 6 (six) hours as needed for anxiety.   docusate sodium 100 MG capsule Commonly known as: COLACE Take 100 mg by mouth daily as needed for mild constipation or moderate constipation.   fluticasone 50 MCG/ACT nasal spray Commonly known as: FLONASE Place 2 sprays into both nostrils daily.   hydrochlorothiazide 25 MG tablet Commonly known as: HYDRODIURIL Take 25 mg by mouth daily.   Klor-Con M20 20 MEQ tablet Generic drug: potassium chloride SA Take 20 mEq by mouth in the morning and at bedtime.   meclizine 25 MG tablet Commonly known as: ANTIVERT Take 25 mg by mouth 3 (three) times daily as needed. For dizziness   morphine 30 MG 12 hr tablet Commonly  known as: MS CONTIN Take 1 tablet (30 mg total) by mouth every 12 (twelve) hours for 3 days.   polyethylene glycol 17 g packet Commonly known as: MIRALAX / GLYCOLAX Take 17 g by mouth daily. Start taking on: January 25, 2024   propranolol 20 MG tablet Commonly known as: INDERAL Take 20 mg by mouth 2 (two)  times daily.   scopolamine 1 MG/3DAYS Commonly known as: TRANSDERM-SCOP Place 1 patch onto the skin every 3 (three) days. As needed   sertraline 100 MG tablet Commonly known as: ZOLOFT Take 50 mg by mouth 2 (two) times daily.   traMADol 50 MG tablet Commonly known as: ULTRAM Take 0.5-1 tablets (25-50 mg total) by mouth every 6 (six) hours as needed for up to 3 days for moderate pain (pain score 4-6).   traZODone 50 MG tablet Commonly known as: DESYREL Take 50-100 mg by mouth at bedtime.        Contact information for after-discharge care     Destination     [facility] .   Service: Skilled Nursing Contact information: [address] [place]  [phone]                    Discharge Exam: Cleavon Curls Weights   01/19/24 2130 01/19/24 2049  Weight: 96.3 kg 86 kg   Appearance: She is normal weight.  HENT:     Head: Normocephalic and atraumatic.     Nose: Nose normal.     Mouth/Throat:     Mouth: Mucous membranes are moist.  Eyes:     Pupils: Pupils are equal, round, and reactive to light.  Cardiovascular:     Rate and Rhythm: Normal rate and regular rhythm.  Pulmonary:     Effort: Pulmonary effort is normal.  Abdominal:     General: Bowel sounds are normal.  Musculoskeletal:        General: Normal range of motion.  Skin:    General: Skin is warm and dry.  Neurological:     General: No focal deficit present.  Psychiatric:        Mood and Affect: Mood normal.   Condition at discharge: good  The results of significant diagnostics from this hospitalization (including imaging, microbiology, ancillary and laboratory) are listed below for reference.   Imaging Studies: MR LUMBAR SPINE WO CONTRAST Result Date: 01/19/2024 CLINICAL DATA:  Initial evaluation for low back pain, trauma, multiple compression deformities. EXAM: MRI LUMBAR SPINE WITHOUT CONTRAST TECHNIQUE: Multiplanar, multisequence MR imaging of the  lumbar spine was performed. No intravenous contrast was administered. COMPARISON:  Prior CT from 01/05/2024 as well as previous MRI from 01/01/2005. FINDINGS: Segmentation: Standard. Lowest well-formed disc space labeled the L5-S1 level. Alignment: Mild exaggeration of the normal lumbar lordosis with kyphotic angulation at the thoracolumbar junction. Underlying mild levoscoliosis. No significant listhesis. Vertebrae: Chronic compression fracture involving the T12 vertebral body with severe central height loss and 4 mm bony retropulsion. Additional chronic compression fracture of L2 with sequelae of prior vertebral augmentation. Up to 40% height loss with 4 mm bony retropulsion at this fracture. Otherwise, vertebral body height maintained with no other acute or recent fracture. There is question of abnormal marrow edema within the partially visualized right sacral ala on sagittal STIR sequence (series 6, image 3). Finding raises the possibility for an acute nondisplaced sacral fracture, although this is incompletely assessed on this exam. Underlying bone marrow signal intensity within normal limits. No worrisome osseous lesions. No  other abnormal marrow edema. Conus medullaris and cauda equina: Conus extends to the L1 level. Conus and cauda equina appear normal. Few scattered Tarlov cyst noted posterior to the sacrum. Paraspinal and other soft tissues: Unremarkable. Disc levels: T12-L1: 4 mm bony retropulsion related to the chronic T12 fracture. Underlying disc desiccation with mild disc bulge. Mild facet hypertrophy. No significant spinal stenosis. Foramina remain patent. L1-2: 4 mm bony retropulsion related to the chronic L2 fracture. Disc desiccation without significant disc bulge. Mild facet hypertrophy. No significant spinal stenosis. Foramina remain patent. L2-3: Disc desiccation with minimal disc bulge. Mild facet and ligament flavum hypertrophy. No significant spinal stenosis. Foramina remain patent. L3-4:  Disc desiccation with mild disc bulge. Mild to moderate facet and ligament flavum hypertrophy. No significant spinal stenosis. Foramina remain patent. L4-5: Mild degenerative vertebral disc space narrowing with diffuse disc bulge and disc desiccation. Reactive endplate spurring. Mild bilateral facet hypertrophy. No significant spinal stenosis. Mild bilateral L4 foraminal narrowing. L5-S1: Disc desiccation without significant disc bulge. Moderate bilateral facet hypertrophy. No spinal stenosis. Foramina remain patent. IMPRESSION: 1. Question abnormal marrow edema within the partially visualized right sacral ala, raising the possibility for an acute/recent nondisplaced sacral fracture. Correlation with physical exam recommended. Additionally, follow-up examination with dedicated MRI of the sacrum could be performed for further evaluation as warranted. 2. No other acute abnormality within the lumbar spine. 3. Chronic compression fractures of T12 and L2 with 4 mm bony retropulsion, and sequelae of prior vertebral augmentation at L2. No significant spinal stenosis or neural impingement at these levels. 4. Mild noncompressive disc bulging with facet hypertrophy at L3-4 and L4-5 with resultant mild bilateral L4 foraminal stenosis. Electronically Signed   By: Virgia Griffins M.D.   On: 01/19/2024 20:03   CT Angio Abd/Pel W and/or Wo Contrast Result Date: 01/19/2024 CLINICAL DATA:  Diffuse abdominal pain. Diffuse chest pain. Back pain. Shortness of breath. Clinical concerns include mesenteric ischemia. EXAM: CTA ABDOMEN AND PELVIS WITHOUT AND WITH CONTRAST TECHNIQUE: Multidetector CT imaging of the abdomen and pelvis was performed using the standard protocol during bolus administration of intravenous contrast. Multiplanar reconstructed images and MIPs were obtained and reviewed to evaluate the vascular anatomy. RADIATION DOSE REDUCTION: This exam was performed according to the departmental dose-optimization program  which includes automated exposure control, adjustment of the mA and/or kV according to patient size and/or use of iterative reconstruction technique. CONTRAST:  100mL OMNIPAQUE IOHEXOL 350 MG/ML SOLN COMPARISON:  None Available. FINDINGS: VASCULAR Aorta: Atheromatous calcifications without aneurysm or dissection. Celiac: Patent without evidence of aneurysm, dissection, vasculitis or significant stenosis. No branch occlusions. SMA: Atheromatous calcifications at the origin without significant luminal narrowing. No branch occlusions. Renals: Calcified plaque at the origin of the left renal artery causing approximately 50% luminal narrowing. Anterior origin of the right renal artery without stenosis. IMA: Patent without evidence of aneurysm, dissection, vasculitis or significant stenosis. Inflow: Patent without evidence of aneurysm, dissection, vasculitis or significant stenosis. Proximal Outflow: Bilateral common femoral and visualized portions of the superficial and profunda femoral arteries are patent without evidence of aneurysm, dissection, vasculitis or significant stenosis. Veins: No obvious venous abnormality within the limitations of this arterial phase study. Review of the MIP images confirms the above findings. NON-VASCULAR Lower chest: Borderline enlarged heart. Mild right and minimal left dependent atelectasis. Small right pleural effusion. Hepatobiliary: Small liver cysts. These do not need imaging follow-up. Cholecystectomy clips. Pancreas: Moderate diffuse pancreatic atrophy. Spleen: Normal in size without focal abnormality. Adrenals/Urinary Tract: Normal-appearing adrenal glands. Left  renal parapelvic cysts. These do not need imaging follow-up. Unremarkable right kidney, ureters and urinary bladder. Stomach/Bowel: Moderate-sized hiatal hernia. Surgically absent appendix. Unremarkable small bowel and colon. Lymphatic: No enlarged lymph nodes. Reproductive: Uterus and bilateral adnexa are unremarkable.  Other: No abdominal wall hernia or abnormality. No abdominopelvic ascites. Musculoskeletal: Lumbar and lower thoracic spine degenerative changes. Old L2 vertebral compression deformity with kyphoplasty material. Associated bony retropulsion causing moderate spinal stenosis. Old T12 compression deformity with associated bony retropulsion causing mild spinal stenosis. Moderate foraminal stenosis on the left at the T12-L1 level due to a combination of the bony retropulsion and facet degenerative changes. Multifactorial moderate spinal stenosis and moderate right and moderate-to-marked left foraminal stenosis at the L4-5 level. No acute fractures or subluxations. IMPRESSION: 1. No evidence of mesenteric ischemia. 2. Approximately 50% stenosis at the origin of the left renal artery. 3. Moderate-sized hiatal hernia. 4. Small right pleural effusion. 5. Borderline cardiomegaly. 6. Old T12 and L2 vertebral compression deformities with associated bony retropulsion causing mild spinal stenosis at T12 and moderate spinal stenosis at L2. 7. Moderate foraminal stenosis on the left at T12-L1. 8. Multifactorial moderate spinal stenosis and moderate right and moderate to marked left foraminal stenosis at L4-5. 9. Aortic atherosclerosis. Electronically Signed   By: Catherin Closs M.D.   On: 01/19/2024 13:37   DG Chest Portable 1 View Result Date: 01/19/2024 CLINICAL DATA:  Chest pain. EXAM: PORTABLE CHEST 1 VIEW COMPARISON:  January 05, 2024. FINDINGS: Stable cardiomediastinal silhouette. Both lungs are clear. The visualized skeletal structures are unremarkable. IMPRESSION: No active disease. Electronically Signed   By: Rosalene Colon M.D.   On: 01/19/2024 11:59   EEG adult Result Date: 01/07/2024 Eleni Griffin, MD     01/07/2024  2:34 PM Routine EEG Report Lindsey Barnes is a 89 y.o. female with a history of syncope who is undergoing an EEG to evaluate for seizures. Report: This EEG was acquired with electrodes placed  according to the International 10-20 electrode system (including Fp1, Fp2, F3, F4, C3, C4, P3, P4, O1, O2, T3, T4, T5, T6, A1, A2, Fz, Cz, Pz). The following electrodes were missing or displaced: none. The occipital dominant rhythm was 8.5 Hz. This activity is reactive to stimulation. Drowsiness was manifested by background fragmentation; deeper stages of sleep were identified by K complexes and sleep spindles. There was no focal slowing. There were no interictal epileptiform discharges. There were no electrographic seizures identified. Impression: This EEG was obtained while awake and asleep and is normal.   Clinical Correlation: Normal EEGs, however, do not rule out epilepsy. Greg Leaks, MD [facility] [phone] If 7pm- 7am, please page neurology on call as listed in AMION.   CT ANGIO HEAD NECK W WO CM Result Date: 01/06/2024 CLINICAL DATA:  acute neuro def EXAM: CT ANGIOGRAPHY HEAD AND NECK WITH AND WITHOUT CONTRAST TECHNIQUE: Multidetector CT imaging of the head and neck was performed using the standard protocol during bolus administration of intravenous contrast. Multiplanar CT image reconstructions and MIPs were obtained to evaluate the vascular anatomy. Carotid stenosis measurements (when applicable) are obtained utilizing NASCET criteria, using the distal internal carotid diameter as the denominator. RADIATION DOSE REDUCTION: This exam was performed according to the departmental dose-optimization program which includes automated exposure control, adjustment of the mA and/or kV according to patient size and/or use of iterative reconstruction technique. CONTRAST:  75mL OMNIPAQUE IOHEXOL 350 MG/ML SOLN COMPARISON:  MRI head January 05, 2024. FINDINGS: CT HEAD FINDINGS Brain: Known acute  infarct and known meningiomas better characterized on recent MRI. No evidence of acute/interval large vascular territory infarct, acute hemorrhage, or midline shift. Vascular: See below. Skull: No acute  fracture. Sinuses/Orbits: No acute finding. Other: No mastoid effusions. Review of the MIP images confirms the above findings CTA NECK FINDINGS Aortic arch: Great vessel origins are patent without significant stenosis. Aortic atherosclerosis. Right carotid system: No evidence of dissection, stenosis (50% or greater), or occlusion. Left carotid system: No evidence of dissection, stenosis (50% or greater), or occlusion. Vertebral arteries: Codominant. No evidence of dissection, stenosis (50% or greater), or occlusion. Skeleton: No acute abnormality on limited assessment. Other neck: No acute abnormality on limited assessment. Upper chest: Visualized lung apices are clear. Review of the MIP images confirms the above findings CTA HEAD FINDINGS Anterior circulation: Bilateral intracranial ICAs, MCAs, and ACAs are patent without proximal hemodynamically significant stenosis. Posterior circulation: Bilateral intradural vertebral arteries, basilar artery and bilateral posterior shoulders are patent without proximal hemodynamically significant stenosis. Venous sinuses: As permitted by contrast timing, patent. Review of the MIP images confirms the above findings IMPRESSION: 1. No emergent large vessel occlusion or proximal hemodynamically significant stenosis. 2. Known acute infarct and meningiomas better characterized on recent MRI. 3.  Aortic Atherosclerosis (ICD10-I70.0). Electronically Signed   By: Stevenson Elbe M.D.   On: 01/06/2024 23:31   ECHOCARDIOGRAM COMPLETE Result Date: 01/05/2024    ECHOCARDIOGRAM REPORT   Patient Name:   Lindsey Barnes [facility] Date of Exam: 01/05/2024 Medical Rec #:  [id number]             Height:       67.0 in Accession #:    [id number]            Weight:       191.0 lb Date of Birth:  1933/06/25            BSA:          1.983 m Patient Age:    89 years              BP:           107/72 mmHg Patient Gender: F                     HR:           71 bpm. Exam Location:  [facility] Procedure: 2D  Echo, Cardiac Doppler, Color Doppler and Strain Analysis (Both            Spectral and Color Flow Doppler were utilized during procedure). Indications:     Syncope  History:         Patient has prior history of Echocardiogram examinations, most                  recent 01/17/2021. Stroke and TIA, Signs/Symptoms:Syncope; Risk                  Factors:Hypertension.  Sonographer:     Clarke Crouch Referring Phys:  [phone] Alayne Allis [place] Diagnosing Phys: Belva Boyden MD  Sonographer Comments: Global longitudinal strain was attempted. IMPRESSIONS  1. Left ventricular ejection fraction, by estimation, is 60 to 65%. The left ventricle has normal function. The left ventricle has no regional wall motion abnormalities. Left ventricular diastolic parameters are consistent with Grade I diastolic dysfunction (impaired relaxation).  2. Right ventricular systolic function is normal. The right ventricular size is normal. There is normal pulmonary artery systolic pressure. The estimated right ventricular systolic pressure  is 29.0 mmHg.  3. The mitral valve is normal in structure. Moderate mitral valve regurgitation. No evidence of mitral stenosis.  4. The aortic valve is normal in structure. Aortic valve regurgitation is not visualized. No aortic stenosis is present.  5. The inferior vena cava is normal in size with greater than 50% respiratory variability, suggesting right atrial pressure of 3 mmHg. FINDINGS  Left Ventricle: Left ventricular ejection fraction, by estimation, is 60 to 65%. The left ventricle has normal function. The left ventricle has no regional wall motion abnormalities. Global longitudinal strain performed but not reported based on interpreter judgement due to suboptimal tracking. The left ventricular internal cavity size was normal in size. There is no left ventricular hypertrophy. Left ventricular diastolic parameters are consistent with Grade I diastolic dysfunction (impaired relaxation). Right Ventricle: The  right ventricular size is normal. No increase in right ventricular wall thickness. Right ventricular systolic function is normal. There is normal pulmonary artery systolic pressure. The tricuspid regurgitant velocity is 2.55 m/s, and  with an assumed right atrial pressure of 3 mmHg, the estimated right ventricular systolic pressure is 29.0 mmHg. Left Atrium: Left atrial size was normal in size. Right Atrium: Right atrial size was normal in size. Pericardium: There is no evidence of pericardial effusion. Mitral Valve: The mitral valve is normal in structure. Moderate mitral valve regurgitation. No evidence of mitral valve stenosis. MV peak gradient, 3.4 mmHg. The mean mitral valve gradient is 2.0 mmHg. Tricuspid Valve: The tricuspid valve is normal in structure. Tricuspid valve regurgitation is mild . No evidence of tricuspid stenosis. Aortic Valve: The aortic valve is normal in structure. Aortic valve regurgitation is not visualized. No aortic stenosis is present. Aortic valve mean gradient measures 7.0 mmHg. Aortic valve peak gradient measures 13.4 mmHg. Aortic valve area, by VTI measures 1.88 cm. Pulmonic Valve: The pulmonic valve was normal in structure. Pulmonic valve regurgitation is not visualized. No evidence of pulmonic stenosis. Aorta: The aortic root is normal in size and structure. Venous: The inferior vena cava is normal in size with greater than 50% respiratory variability, suggesting right atrial pressure of 3 mmHg. IAS/Shunts: No atrial level shunt detected by color flow Doppler. Additional Comments: 3D was performed not requiring image post processing on an independent workstation and was indeterminate.  LEFT VENTRICLE PLAX 2D LVIDd:         4.20 cm   Diastology LVIDs:         2.90 cm   LV e' medial:    4.68 cm/s LV PW:         1.50 cm   LV E/e' medial:  16.7 LV IVS:        1.20 cm   LV e' lateral:   6.20 cm/s LVOT diam:     2.00 cm   LV E/e' lateral: 12.6 LV SV:         66 LV SV Index:   33         2D Longitudinal Strain LVOT Area:     3.14 cm  2D Strain GLS Avg:     -13.1 %  RIGHT VENTRICLE RV Basal diam:  3.55 cm RV Mid diam:    3.00 cm RV S prime:     11.90 cm/s TAPSE (M-mode): 2.1 cm LEFT ATRIUM             Index        RIGHT ATRIUM          Index LA diam:  4.10 cm 2.07 cm/m   RA Area:     9.65 cm LA Vol (A2C):   48.5 ml 24.46 ml/m  RA Volume:   26.81 ml 13.52 ml/m LA Vol (A4C):   60.6 ml 30.56 ml/m LA Biplane Vol: 58.8 ml 29.65 ml/m  AORTIC VALVE                     PULMONIC VALVE AV Area (Vmax):    2.03 cm      PV Vmax:       1.19 m/s AV Area (Vmean):   1.72 cm      PV Peak grad:  5.7 mmHg AV Area (VTI):     1.88 cm AV Vmax:           183.00 cm/s AV Vmean:          120.000 cm/s AV VTI:            0.353 m AV Peak Grad:      13.4 mmHg AV Mean Grad:      7.0 mmHg LVOT Vmax:         118.00 cm/s LVOT Vmean:        65.600 cm/s LVOT VTI:          0.211 m LVOT/AV VTI ratio: 0.60  AORTA Ao Root diam: 3.10 cm Ao Asc diam:  3.60 cm MITRAL VALVE               TRICUSPID VALVE MV Area (PHT): 2.47 cm    TR Peak grad:   26.0 mmHg MV Area VTI:   2.34 cm    TR Vmax:        255.00 cm/s MV Peak grad:  3.4 mmHg MV Mean grad:  2.0 mmHg    SHUNTS MV Vmax:       0.92 m/s    Systemic VTI:  0.21 m MV Vmean:      57.1 cm/s   Systemic Diam: 2.00 cm MV Decel Time: 307 msec MV E velocity: 78.00 cm/s MV A velocity: 98.50 cm/s MV E/A ratio:  0.79 Belva Boyden MD Electronically signed by Belva Boyden MD Signature Date/Time: 01/05/2024/5:43:46 PM    Final    MR BRAIN WO CONTRAST Result Date: 01/05/2024 CLINICAL DATA:  Mental status change, history of TIA, fall and syncopal event. EXAM: MRI HEAD WITHOUT CONTRAST TECHNIQUE: Multiplanar, multiecho pulse sequences of the brain and surrounding structures were obtained without intravenous contrast. COMPARISON:  Earlier same day CT head, MRI brain 01/21/2022 FINDINGS: Brain: Punctate focus of restricted diffusion in the cortex of the posterior left frontal lobe. No  evidence of intracranial hemorrhage. Redemonstration of a 1.9 x 1.3 x 0.9 cm extra-axial mass over the posterior right frontal lobe near the vertex, similar to prior when remeasured in a similar manner. Minimal mass effect on the adjacent parenchyma. No signal abnormality or edema within the adjacent parenchyma. Additional extra-axial mass along the anterior falx abutting the anterior parasagittal right frontal lobe measuring 0.8 x 0.7 x 0.8 cm, similar to prior. Scattered and confluent FLAIR signal abnormality in the periventricular and subcortical white matter. 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 No midline shift. Normal appearance of midline structures. The basilar cisterns are patent. No extra-axial fluid collections. Ventricles: Normal size and configuration of the ventricles. Vascular: Skull base flow voids are visualized. Skull and upper cervical spine: No focal abnormality. Sinuses/Orbits: Orbits are symmetric. Paranasal sinuses are clear. Other: Mastoid air cells  are clear. IMPRESSION: Punctate focus of acute infarct involving the cortex of the posterior left frontal lobe. Similar appearance of meningiomas over the right frontal lobe and anterior falx. No midline shift. Moderate chronic microvascular ischemic changes. 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 Electronically Signed   By: Emily Filbert M.D.   On: 01/05/2024 10:51   CT Thoracic Spine Wo Contrast Addendum Date: 01/05/2024 ADDENDUM REPORT: 01/05/2024 00:57 ADDENDUM: Correlation with prior lumbar spine radiographs from 01/17/2021 show no change to the T12 compression fracture. No acute compression is indicated. Electronically Signed   By: Burman Nieves M.D.   On: 01/05/2024 00:57   Result Date: 01/05/2024 CLINICAL DATA:  Back trauma after a fall. EXAM: CT THORACIC SPINE WITHOUT CONTRAST TECHNIQUE: Multidetector CT images of the thoracic were obtained using the  standard protocol without intravenous contrast. RADIATION DOSE REDUCTION: This exam was performed according to the departmental dose-optimization program which includes automated exposure control, adjustment of the mA and/or kV according to patient size and/or use of iterative reconstruction technique. COMPARISON:  CT thoracolumbar junction 01/13/2012 FINDINGS: Alignment: Normal alignment of the thoracic spine. Vertebrae: Compression of the T12 vertebra demonstrates progression since previous study. Acute progression is not excluded. There is about 80% loss of height centrally. No other vertebral compression deformities. Vertebral hemangioma at T8. No destructive or expansile bone lesions. Paraspinal and other soft tissues: No abnormal paraspinal soft tissue mass or infiltration. Calcification of the aorta. Small esophageal hiatal hernia. Disc levels: Degenerative changes with disc space narrowing and endplate osteophyte formation throughout. IMPRESSION: 1. Normal alignment of the thoracic spine. Progression of compression fracture at T12 since previous study from 2013. Acute progression is not excluded. 2. Degenerative changes throughout the thoracic spine. 3. Aortic atherosclerosis.  Small esophageal hiatal hernia. Electronically Signed: By: Burman Nieves M.D. On: 01/05/2024 00:50   CT Cervical Spine Wo Contrast Result Date: 01/05/2024 CLINICAL DATA:  Neck trauma after a fall. EXAM: CT CERVICAL SPINE WITHOUT CONTRAST TECHNIQUE: Multidetector CT imaging of the cervical spine was performed without intravenous contrast. Multiplanar CT image reconstructions were also generated. RADIATION DOSE REDUCTION: This exam was performed according to the departmental dose-optimization program which includes automated exposure control, adjustment of the mA and/or kV according to patient size and/or use of iterative reconstruction technique. COMPARISON:  12/24/2013 FINDINGS: Alignment: Normal alignment of the cervical spine  and facet joints. Skull base and vertebrae: Skull base appears intact. No vertebral compression deformities. No focal bone lesion or bone destruction. Soft tissues and spinal canal: No prevertebral soft tissue swelling. No abnormal paraspinal soft tissue mass or infiltration. Disc levels: Degenerative changes throughout the cervical spine with disc space narrowing and endplate osteophyte formation. Degenerative changes throughout the facet joints. Degenerative changes in the temporomandibular joints. Upper chest: Visualized lung apices are clear. Other: None. IMPRESSION: Normal alignment of the cervical vertebrae. No acute displaced fractures identified. Diffuse degenerative changes. Electronically Signed   By: Burman Nieves M.D.   On: 01/05/2024 00:56   CT Lumbar Spine Wo Contrast Result Date: 01/05/2024 CLINICAL DATA:  Back trauma due to a fall. EXAM: CT LUMBAR SPINE WITHOUT CONTRAST TECHNIQUE: Multidetector CT imaging of the lumbar spine was performed without intravenous contrast administration. Multiplanar CT image reconstructions were also generated. RADIATION DOSE REDUCTION: This exam was performed according to the departmental dose-optimization program which includes automated exposure control, adjustment of the mA and/or kV according to patient size and/or use of iterative reconstruction technique. COMPARISON:  Lumbar spine radiographs  01/17/2021. CT lumbar spine 01/13/2012 FINDINGS: Segmentation: 5 lumbar type vertebral bodies. Alignment: Normal alignment. Vertebrae: Chronic compression of the L2 vertebra post kyphoplasty. No significant progression. Mild retropulsion of fracture fragments at L2 is unchanged. No new compression deformities in the lumbar spine. Tarlov cysts in the sacrum. Paraspinal and other soft tissues: No abnormal paraspinal soft tissue mass or infiltration. Calcification of the aorta. Disc levels: Degenerative changes throughout with disc space narrowing and endplate osteophyte  formation. Degenerative changes throughout the facet joints. IMPRESSION: 1. Normal alignment of the lumbar spine. Old compression of L2 post kyphoplasty is unchanged. No acute bony abnormalities. Electronically Signed   By: Burman Nieves M.D.   On: 01/05/2024 00:53   CT Head Wo Contrast Result Date: 01/05/2024 CLINICAL DATA:  Minor head trauma. Patient fell on Plavix. Vertigo and dizziness this morning. EXAM: CT HEAD WITHOUT CONTRAST TECHNIQUE: Contiguous axial images were obtained from the base of the skull through the vertex without intravenous contrast. RADIATION DOSE REDUCTION: This exam was performed according to the departmental dose-optimization program which includes automated exposure control, adjustment of the mA and/or kV according to patient size and/or use of iterative reconstruction technique. COMPARISON:  MRI brain 01/21/2022.  CT head 01/17/2021 FINDINGS: Brain: Mild diffuse cerebral atrophy. Low-attenuation changes throughout the deep white matter most consistent with small vessel ischemic changes. No progression since prior study. No mass-effect or midline shift. No abnormal extra-axial fluid collections. Basal cisterns are not effaced. No acute intracranial hemorrhage. Vascular: No hyperdense vessel or unexpected calcification. Skull: Normal. Negative for fracture or focal lesion. Sinuses/Orbits: No acute finding. Other: None. IMPRESSION: No acute intracranial abnormalities. Chronic atrophy and small vessel ischemic changes similar to prior study. Electronically Signed   By: Burman Nieves M.D.   On: 01/05/2024 00:47   DG Ribs Bilateral W/Chest Result Date: 01/05/2024 CLINICAL DATA:  Rib pain. Patient fell. Vertigo with dizziness this morning. EXAM: BILATERAL RIBS AND CHEST - 4+ VIEW COMPARISON:  Chest radiograph 03/20/2018 FINDINGS: Heart size and pulmonary vascularity are normal. Lungs are clear. No pleural effusion or pneumothorax. Surgical clips over the right chest. Calcification  of the aorta. Fracture of the right posterior fourth rib with callus formation suggesting a healing fracture. No acute displaced fractures demonstrated in the right or left ribs. Soft tissues are unremarkable. Degenerative changes in the spine and shoulders. IMPRESSION: 1. No evidence of active pulmonary disease. 2. Healing fracture in the right fourth rib. 3. No acute displaced rib fractures identified bilaterally. Electronically Signed   By: Burman Nieves M.D.   On: 01/05/2024 00:30    Microbiology: Results for orders placed or performed during the hospital encounter of 01/17/21  Resp Panel by RT-PCR (Flu A&B, Covid) Nasopharyngeal Swab     Status: None   Collection Time: 01/17/21 12:33 PM   Specimen: Nasopharyngeal Swab; Nasopharyngeal(NP) swabs in vial transport medium  Result Value Ref Range Status   SARS Coronavirus 2 by RT PCR NEGATIVE NEGATIVE Final    Comment: (NOTE) SARS-CoV-2 target nucleic acids are NOT DETECTED.  The SARS-CoV-2 RNA is generally detectable in upper respiratory specimens during the acute phase of infection. The lowest concentration of SARS-CoV-2 viral copies this assay can detect is 138 copies/mL. A negative result does not preclude SARS-Cov-2 infection and should not be used as the sole basis for treatment or other patient management decisions. A negative result may occur with  improper specimen collection/handling, submission of specimen other than nasopharyngeal swab, presence of viral mutation(s) within the areas targeted by  this assay, and inadequate number of viral copies(<138 copies/mL). A negative result must be combined with clinical observations, patient history, and epidemiological information. The expected result is Negative.  Fact Sheet for Patients:  BloggerCourse.com  Fact Sheet for Healthcare Providers:  SeriousBroker.it  This test is no t yet approved or cleared by the [place]  FDA  and  has been authorized for detection and/or diagnosis of SARS-CoV-2 by FDA under an Emergency Use Authorization (EUA). This EUA will remain  in effect (meaning this test can be used) for the duration of the COVID-19 declaration under Section 564(b)(1) of the Act, 21 U.S.C.section 360bbb-3(b)(1), unless the authorization is terminated  or revoked sooner.       Influenza A by PCR NEGATIVE NEGATIVE Final   Influenza B by PCR NEGATIVE NEGATIVE Final    Comment: (NOTE) The Xpert Xpress SARS-CoV-2/FLU/RSV plus assay is intended as an aid in the diagnosis of influenza from Nasopharyngeal swab specimens and should not be used as a sole basis for treatment. Nasal washings and aspirates are unacceptable for Xpert Xpress SARS-CoV-2/FLU/RSV testing.  Fact Sheet for Patients: BloggerCourse.com  Fact Sheet for Healthcare Providers: SeriousBroker.it  This test is not yet approved or cleared by the [place]  FDA and has been authorized for detection and/or diagnosis of SARS-CoV-2 by FDA under an Emergency Use Authorization (EUA). This EUA will remain in effect (meaning this test can be used) for the duration of the COVID-19 declaration under Section 564(b)(1) of the Act, 21 U.S.C. section 360bbb-3(b)(1), unless the authorization is terminated or revoked.  Performed at [facility], [address] [place], [place] [address]     Labs: CBC: Recent Labs  Lab 01/19/24 430 [id number] 01/22/24 0540 01/23/24 0421 01/24/24 0210  WBC 6.4 6.7 5.3 4.8  NEUTROABS 4.7 5.0 3.2 3.1  HGB 13.0 12.8 12.5 12.3  HCT 38.9 37.9 38.6 37.9  MCV 91.3 92.7 96.3 95.0  PLT 280 243 223 223   Basic Metabolic Panel: Recent Labs  Lab 01/19/24 0948 01/22/24 0540 01/23/24 0421 01/24/24 0210  NA 136 136 137 135  K 3.9 2.8* 4.3 4.0  CL 103 96* 98 95*  CO2 27 32 31 32  GLUCOSE 106* 130* 116* 114*  BUN 21 21 21 21   CREATININE 0.74 0.73 0.71 0.66   CALCIUM 8.6* 7.7* 8.0* 8.1*  MG  --  2.3  --   --    Liver Function Tests: Recent Labs  Lab 01/19/24 0948  AST 19  ALT 13  ALKPHOS 131*  BILITOT 1.0  PROT 6.6  ALBUMIN 3.5   CBG: No results for input(s): "GLUCAP" in the last 168 hours.  Discharge time spent:  35 minutes.  Signed: Ezzard Holms, MD [facility] 01/24/2024   Addendum at 8:48AM on 01/27/2024 Patient seen and examined did not have any acute overnight changes and has been cleared for discharge today Signed: Ezzard Holms, MD

## 2024-01-24 NOTE — Plan of Care (Signed)

## 2024-01-24 NOTE — Plan of Care (Signed)

## 2024-01-24 NOTE — TOC Progression Note (Addendum)
 Transition of Care [facility]) - Progression Note    Patient Details  Name: Lindsey Barnes MRN: [id number] Date of Birth: [birth year]  Transition of Care [facility]) CM/SW Contact  Crayton Docker, RN 01/24/2024, 12:03 PM  Clinical Narrative:      Alert received from Lavaun Porto regarding call from patient's daughter, Benn Brash and preferred short term rehab bed at [facility] [place]. CM call to Admissions, [facility], phone: [phone] regarding bed availability. No answer, CM left message for Cain Castillo to call CM back. CM call to patient's daughter, Benn Brash, phone [phone] regarding preferred SNF. Per patient's daughter, Benn Brash has spoken to Cain Castillo, Admissions and per Cain Castillo, SNF could accept. CM will follow up regarding discharge care planning needs. Patient will need auth called in to [facility] [employer].  Call received from Tammy regarding auth dates. Per Benn Brash, auth valid until Thursday, 01/27/2024 and EMS [facility] is valid for 90 days.  Call received from [place], Admissions, [facility] regarding bed availability. Per Cain Castillo, SNF can accept patient today if discharge summary can be received by 1400. CM alert to Dr. Mariella Shore regarding SNF admission today versus tomorrow. CM will continue to follow.  Call received from [facility], regarding PASSR.  CM will initiate PASSR for SNF placement. Discharge orders, discharge summary, SNF transfer report faxed to [facility].   CM faxed supporting documentation for PASSR to [facility], fax: [phone]. CM awaiting fax confirmation.  CM follow up call placed to patient's daughter, Tammy regarding awaiting PASSR approval. Patient's daughter verbalized understanding and agreement.   CM refaxed supporting documentation twice to [place] [facility]. CM call to [facility], phone: [phone]. CM spoke to [place] regarding receipt of supporting documents. Per Guy Leiter, request CM to follow up in morning regarding receipt of supporting documentation.  Expected  Discharge Plan: Skilled Nursing Facility Barriers to Discharge: Continued Medical Work up  Expected Discharge Plan and Services    SNF   Living arrangements for the past 2 months: Single Family Home Expected Discharge Date: 01/24/24                   Social Determinants of Health (SDOH) Interventions SDOH Screenings   Food Insecurity: No Food Insecurity (01/19/2024)  Housing: Low Risk  (01/19/2024)  Transportation Needs: No Transportation Needs (01/19/2024)  Utilities: Not At Risk (01/19/2024)  Financial Resource Strain: Patient Declined (04/30/2023)   Received from [facility]  Social Connections: Socially Isolated (01/19/2024)  Tobacco Use: Low Risk  (01/19/2024)    Readmission Risk Interventions    01/21/2024   12:35 PM  Readmission Risk Prevention Plan  Post Dischage Appt Complete  Medication Screening Complete  Transportation Screening Complete

## 2024-01-25 DIAGNOSIS — S32020G Wedge compression fracture of second lumbar vertebra, subsequent encounter for fracture with delayed healing: Secondary | ICD-10-CM | POA: Diagnosis not present

## 2024-01-25 NOTE — Progress Notes (Signed)
 Physical Therapy Treatment Patient Details Name: Lindsey Barnes MRN: [id number] DOB: 1933/06/30 Today's Date: 01/25/2024   History of Present Illness Pt is an 89 y.o. female presenting to hospital 01/19/24 with c/o chest pain, back pain, and SOB that started that morning; numbness in finger tips that resolved; has tremor at baseline from previous stroke.  Imaging showing possible acute/recent nondisplaced sacral fx; chronic compression fx's of T12 and L2 with 4mm bony retropulsion.  PMH includes h/o CVA, acid reflux, chronic distolic HF, compression fx T12, htn, anxiety, breast CA, kyphoplasty.    PT Comments  Pt agrees to session.  Stated she was up to [facility] earlier with staff and very fearful of falling/dizzy.  She is able to get in/out of bed with min a x 1.  Stood with mod a x 1 mostly due to fear and increasing dizziness.  She does stand for 2 trials and is able to sidestep along bed but very limited due to symptoms.  High risk off alls remain.  TLSO is worn for session and donned with max a.  She does state it helps with pain with mobility.   If plan is discharge home, recommend the following: A lot of help with walking and/or transfers;A lot of help with bathing/dressing/bathroom;Assistance with cooking/housework;Assist for transportation;Help with stairs or ramp for entrance   Can travel by private vehicle     No  Equipment Recommendations  Rolling walker (2 wheels)    Recommendations for Other Services       Precautions / Restrictions Precautions Precautions: Fall Recall of Precautions/Restrictions: Intact Required Braces or Orthoses: Spinal Brace Spinal Brace: Lumbar corset;Other (comment) Spinal Brace Comments: LSO quick draw; Brace on when OOB; brace for comfort--can get up without her brace     Mobility  Bed Mobility Overal bed mobility: Needs Assistance Bed Mobility: Supine to Sit, Sit to Supine     Supine to sit: Min assist Sit to supine: Min assist   General  bed mobility comments: VC for log roll, MIN A for trunk >sit, BLE >supine Patient Response: Cooperative  Transfers   Equipment used: Rolling walker (2 wheels) Transfers: Sit to/from Stand Sit to Stand: Min assist                Ambulation/Gait Ambulation/Gait assistance: Min assist, Mod assist Gait Distance (Feet): 3 Feet Assistive device: Rolling walker (2 wheels) Gait Pattern/deviations: Step-to pattern Gait velocity: decreased     General Gait Details: sidesteps along bed ro reposition but gait limited by dizziness ( chronic vertigo/dizzness at baseline)   Stairs             Wheelchair Mobility     Tilt Bed Tilt Bed Patient Response: Cooperative  Modified Rankin (Stroke Patients Only)       Balance Overall balance assessment: Needs assistance Sitting-balance support: No upper extremity supported, Feet supported Sitting balance-Leahy Scale: Fair Sitting balance - Comments: steady static sitting   Standing balance support: Bilateral upper extremity supported, Reliant on assistive device for balance Standing balance-Leahy Scale: Poor Standing balance comment: assist for safety (d/t pt feeling "swimmy-headed in standing)                            Communication Communication Factors Affecting Communication: Hearing impaired  Cognition Arousal: Alert Behavior During Therapy: WFL for tasks assessed/performed   PT - Cognitive impairments: No apparent impairments  Following commands: Intact      Cueing Cueing Techniques: Verbal cues, Visual cues, Tactile cues  Exercises      General Comments        Pertinent Vitals/Pain      Home Living                          Prior Function            PT Goals (current goals can now be found in the care plan section) Progress towards PT goals: Progressing toward goals    Frequency    Min 2X/week      PT Plan      Co-evaluation               AM-PAC PT "6 Clicks" Mobility   Outcome Measure  Help needed turning from your back to your side while in a flat bed without using bedrails?: A Little Help needed moving from lying on your back to sitting on the side of a flat bed without using bedrails?: A Little Help needed moving to and from a bed to a chair (including a wheelchair)?: A Lot Help needed standing up from a chair using your arms (e.g., wheelchair or bedside chair)?: A Lot Help needed to walk in hospital room?: A Lot Help needed climbing 3-5 steps with a railing? : Total 6 Click Score: 13    End of Session Equipment Utilized During Treatment: Gait belt;Back brace Activity Tolerance: Treatment limited secondary to medical complications (Comment) Patient left: in bed;with call bell/phone within reach;with bed alarm set;with family/visitor present Nurse Communication: Mobility status;Precautions;Other (comment) PT Visit Diagnosis: Other abnormalities of gait and mobility (R26.89);Muscle weakness (generalized) (M62.81);History of falling (Z91.81);Pain Pain - Right/Left: Right     Time: 1610-9604 PT Time Calculation (min) (ACUTE ONLY): 10 min  Charges:    $Therapeutic Activity: 8-22 mins PT General Charges $$ ACUTE PT VISIT: 1 Visit                    Charlanne Cong, PTA 01/25/24, 9:42 AM

## 2024-01-25 NOTE — TOC Progression Note (Addendum)
 Transition of Care [facility]) - Progression Note    Patient Details  Name: Lindsey Barnes MRN: [id number] Date of Birth: 12/21/1932  [facility]) CM/SW Contact  Crayton Docker, RN 01/25/2024, 12:48 PM  Clinical Narrative:     Call received from patient's daughter, Tammy regarding PASSR status. CM re-faxed supporting documentation for PASSR today for PASSR approval. PASSR currently under review. CM follow up call placed to patient's daughter Tammy regarding PASSR currently under review.    1514--CM follow up in [place] MUST, PASSR screening currently in review.    Expected Discharge Plan: Skilled Nursing Facility Barriers to Discharge: Continued Medical Work up  Expected Discharge Plan and Services       Living arrangements for the past 2 months: Single Family Home Expected Discharge Date: 01/24/24                                     Social Determinants of Health (SDOH) Interventions SDOH Screenings   Food Insecurity: No Food Insecurity (01/19/2024)  Housing: Low Risk  (01/19/2024)  Transportation Needs: No Transportation Needs (01/19/2024)  Utilities: Not At Risk (01/19/2024)  Financial Resource Strain: Patient Declined (04/30/2023)   [facility]  Social Connections: Socially Isolated (01/19/2024)  Tobacco Use: Low Risk  (01/19/2024)    Readmission Risk Interventions    01/21/2024   12:35 PM  Readmission Risk Prevention Plan  Post Dischage Appt Complete  Medication Screening Complete  Transportation Screening Complete

## 2024-01-25 NOTE — Progress Notes (Signed)
 Progress Note   Patient: Lindsey Barnes ZOX:[id number] DOB: 22-Feb-1933 DOA: 01/19/2024     6 DOS: the patient was seen and examined on 01/25/2024   Brief hospital course:   Lindsey Barnes is a 89 y.o. female with medical history significant of Hypertension, GERD, chronic back pain, history of breast cancer, TIA, who comes seen with complaints of back pain with intensity 10/10.  According to patient's daughter present at bedside patient nearly had a syncopal episode today at home.  She initially started complaining of some associated numbness in bilateral hands however this had resolved by the time patient was being seen.  She complained of some abdominal pain mostly in the epigastric region.  Denies nausea vomiting chest pain or urinary complaints.  Patient was recently admitted on 01/05/2024 and was found to have some old compression fracture and at that time managed with pain medication and discharged home however according to patient's daughter patient has continued to be in pain and still not able to walk with her regular walker and therefore brought in for further management. ED course: Temperature 97.6 respiratory rate 30 pulse 74 blood pressure 146/84 saturating on 2 L; 97%.  Given above concerns hospitalist consulted for contacted to admit patient for further management     Assessment and Plan: Acute on chronic intractable back pain CT scan revealing findings of old compression fracture noted at T12 and L2 Patient currently on pain medication Continue MS Contin for base of pain control Continue as needed Dilaudid, tramadol based on pain scale I have discussed with neurosurgeon and patient does not want to pursue any surgical intervention and as such has declined to undergo MRI. Rehab facility bed will be available tomorrow for discharge   Hypokalemia-continue repletion and monitoring   GERD Continue Protonix   Hyperlipidemia Continue statin therapy     HTN  (hypertension) Continue home antihypertensives   DVT prophylaxis-continue Lovenox    Advance Care Planning:   Code Status: Prior DNR   Consults: Neurosurgery   Family Communication: Discussed with patient daughter at bedside     Subjective:  Patient seen and examined at bedside this morning Patient discharged yesterday however still awaiting passr report Denies nausea vomiting abdominal pain or chest pain   Physical Exam:   Appearance: She is normal weight.  HENT:     Head: Normocephalic and atraumatic.     Nose: Nose normal.     Mouth/Throat:     Mouth: Mucous membranes are moist.  Eyes:     Pupils: Pupils are equal, round, and reactive to light.  Cardiovascular:     Rate and Rhythm: Normal rate and regular rhythm.  Pulmonary:     Effort: Pulmonary effort is normal.  Abdominal:     General: Bowel sounds are normal.  Musculoskeletal:        General: Normal range of motion.  Skin:    General: Skin is warm and dry.  Neurological:     General: No focal deficit present.  Psychiatric:        Mood and Affect: Mood normal.      Data Reviewed:  Vitals:   01/24/24 1503 01/24/24 1939 01/25/24 0336 01/25/24 0832  BP: 135/76 124/63 125/70 118/66  Pulse: 82 85 76 91  Resp: 16 16 16 16   Temp: 98.5 F (36.9 C) 98.3 F (36.8 C) 98.2 F (36.8 C) 97.7 F (36.5 C)  TempSrc:  Oral Oral   SpO2: 96% 96% 98% 95%  Weight:  Height:          Latest Ref Rng & Units 01/24/2024    2:10 AM 01/23/2024    4:21 AM 01/22/2024    5:40 AM  CBC  WBC 4.0 - 10.5 K/uL 4.8  5.3  6.7   Hemoglobin 12.0 - 15.0 g/dL 78.2  95.6  21.3   Hematocrit 36.0 - 46.0 % 37.9  38.6  37.9   Platelets 150 - 400 K/uL 223  223  243        Latest Ref Rng & Units 01/24/2024    2:10 AM 01/23/2024    4:21 AM 01/22/2024    5:40 AM  BMP  Glucose 70 - 99 mg/dL 086  578  469   BUN 8 - 23 mg/dL 21  21  21    Creatinine 0.44 - 1.00 mg/dL 6.29  5.28  4.13   Sodium 135 - 145 mmol/L 135  137  136   Potassium 3.5  - 5.1 mmol/L 4.0  4.3  2.8   Chloride 98 - 111 mmol/L 95  98  96   CO2 22 - 32 mmol/L 32  31  32   Calcium 8.9 - 10.3 mg/dL 8.1  8.0  7.7      Author: Ezzard Holms, MD 01/25/2024 3:53 PM  For on call review [email or web address].

## 2024-01-25 NOTE — Plan of Care (Signed)

## 2024-01-25 NOTE — Plan of Care (Signed)

## 2024-01-26 DIAGNOSIS — R55 Syncope and collapse: Secondary | ICD-10-CM | POA: Diagnosis not present

## 2024-01-26 LAB — GLUCOSE, CAPILLARY: Glucose-Capillary: 130 mg/dL — ABNORMAL HIGH (ref 70–99)

## 2024-01-26 LAB — CREATININE, SERUM
Creatinine, Ser: 0.75 mg/dL (ref 0.44–1.00)
GFR, Estimated: >60 mL/min (ref >60)

## 2024-01-26 NOTE — Progress Notes (Signed)
 Assisted pt to [facility] and back to bed. Performed vital signs and re-initiated fall precaution protocol. After leaving pt room, Charge Nurse came and informed this nurse that the pt is c/o trouble breathing. Enter pt's room to assess pt, vital signs repeated with improvement from previous vital but pt speech was scrambled with a notable change in LOC. RAPID RESPONSE called at 0435. Team arrived and assessed pt. No new orders. Will continue to monitor

## 2024-01-26 NOTE — Plan of Care (Signed)

## 2024-01-26 NOTE — Progress Notes (Signed)
 Progress Note   Patient: Lindsey Barnes ZHY:[id number] DOB: 04-16-1933 DOA: 01/19/2024     7 DOS: the patient was seen and examined on 01/26/2024    Brief hospital course:   Lindsey Barnes is a 89 y.o. female with medical history significant of Hypertension, GERD, chronic back pain, history of breast cancer, TIA, who comes seen with complaints of back pain with intensity 10/10.  According to patient's daughter present at bedside patient nearly had a syncopal episode today at home.  She initially started complaining of some associated numbness in bilateral hands however this had resolved by the time patient was being seen.  She complained of some abdominal pain mostly in the epigastric region.  Denies nausea vomiting chest pain or urinary complaints.  Patient was recently admitted on 01/05/2024 and was found to have some old compression fracture and at that time managed with pain medication and discharged home however according to patient's daughter patient has continued to be in pain and still not able to walk with her regular walker and therefore brought in for further management. ED course: Temperature 97.6 respiratory rate 30 pulse 74 blood pressure 146/84 saturating on 2 L; 97%.  Given above concerns hospitalist consulted for contacted to admit patient for further management     Assessment and Plan: Acute on chronic intractable back pain CT scan revealing findings of old compression fracture noted at T12 and L2 Patient currently on pain medication Continue MS Contin for base of pain control Continue as needed Dilaudid, tramadol based on pain scale I have discussed with neurosurgeon and patient does not want to pursue any surgical intervention and as such has declined to undergo MRI. Rehab facility bed will be available tomorrow for discharge   Hypokalemia-continue repletion and monitoring   GERD Continue Protonix   Hyperlipidemia Continue statin therapy     HTN  (hypertension) Continue home antihypertensives   DVT prophylaxis-continue Lovenox    Advance Care Planning:   Code Status: Prior DNR   Consults: Neurosurgery   Family Communication: Discussed with patient daughter at bedside     Subjective:  Patient seen and examined at bedside this morning Transition of care coordinator continues to work on passr report Denies nausea vomiting abdominal pain or chest pain Patient had a vasovagal episode overnight however she is feeling much better now  Physical Exam:   Appearance: She is normal weight.  HENT:     Head: Normocephalic and atraumatic.     Nose: Nose normal.     Mouth/Throat:     Mouth: Mucous membranes are moist.  Eyes:     Pupils: Pupils are equal, round, and reactive to light.  Cardiovascular:     Rate and Rhythm: Normal rate and regular rhythm.  Pulmonary:     Effort: Pulmonary effort is normal.  Abdominal:     General: Bowel sounds are normal.  Musculoskeletal:        General: Normal range of motion.  Skin:    General: Skin is warm and dry.  Neurological:     General: No focal deficit present.  Psychiatric:        Mood and Affect: Mood normal.      Data Reviewed:    Vitals:   01/26/24 0435 01/26/24 0450 01/26/24 0622 01/26/24 0758  BP: 121/77 127/72 135/76 130/86  Pulse: 93 88 97 100  Resp:   18 17  Temp:   97.9 F (36.6 C)   TempSrc:   Oral   SpO2: 100% 100%  98% 94%  Weight:      Height:          Latest Ref Rng & Units 01/26/2024    4:42 AM 01/24/2024    2:10 AM 01/23/2024    4:21 AM  BMP  Glucose 70 - 99 mg/dL  604  540   BUN 8 - 23 mg/dL  21  21   Creatinine 9.81 - 1.00 mg/dL 1.91  4.78  2.95   Sodium 135 - 145 mmol/L  135  137   Potassium 3.5 - 5.1 mmol/L  4.0  4.3   Chloride 98 - 111 mmol/L  95  98   CO2 22 - 32 mmol/L  32  31   Calcium 8.9 - 10.3 mg/dL  8.1  8.0        Latest Ref Rng & Units 01/24/2024    2:10 AM 01/23/2024    4:21 AM 01/22/2024    5:40 AM  CBC  WBC 4.0 - 10.5 K/uL 4.8   5.3  6.7   Hemoglobin 12.0 - 15.0 g/dL 62.1  30.8  65.7   Hematocrit 36.0 - 46.0 % 37.9  38.6  37.9   Platelets 150 - 400 K/uL 223  223  243      Author: Ezzard Holms, MD 01/26/2024 3:08 PM  For on call review [email or web address].

## 2024-01-26 NOTE — Progress Notes (Addendum)
       CROSS COVER NOTE  NAME: Lindsey Barnes MRN: [id number] DOB : [birth year]  SIGNIFICANT EVENT: Rapid response  Concern as stated by nurse / staff   Patient was straining to have a bowel movement on the bedside commode and was unsuccessful.  As a nurse was helping her back to her bed she became confused and her speech appeared garbled     Pertinent history on chart review: Past medical history of HTN, TIA, breast cancer, chronic back pain admitted on 4/9 with intractable pain secondary to compression fracture managed with TLSO brace  Patient assessment On arrival to the room, rapid response team at bedside.  Patient in bed, awake alert and conversant, answering questions appropriately NIHSS of 0    01/26/2024    4:50 AM 01/26/2024    4:35 AM 01/26/2024    3:44 AM  Vitals with BMI  Systolic 127 121 409  Diastolic 72 77 75  Pulse 88 93 92   Physical Exam Vitals and nursing note reviewed.  Constitutional:      General: She is not in acute distress. HENT:     Head: Normocephalic and atraumatic.  Eyes:     General: No visual field deficit. Cardiovascular:     Rate and Rhythm: Normal rate and regular rhythm.     Heart sounds: Normal heart sounds.  Pulmonary:     Effort: Pulmonary effort is normal.     Breath sounds: Normal breath sounds.  Abdominal:     Palpations: Abdomen is soft.     Tenderness: There is no abdominal tenderness.  Neurological:     Mental Status: She is alert. Mental status is at baseline.     GCS: GCS eye subscore is 4. GCS verbal subscore is 5. GCS motor subscore is 6.     Cranial Nerves: No cranial nerve deficit, dysarthria or facial asymmetry.     Motor: No weakness.     Coordination: Heel to Shin Test normal.      Assessment and  Interventions   Assessment:  Suspect vasovagal episode-transient confusion in the setting of straining to have a bowel movement  Plan: Neurologic checks No further workup at this time Discussed plan of  care with bedside nurse, and ICU charge nurse attending to rapid response      CRITICAL CARE Performed by: Lanetta Pion   Total critical care time: 40 minutes  Critical care time was exclusive of separately billable procedures and treating other patients.  Critical care was necessary to treat or prevent imminent or life-threatening deterioration.  Critical care was time spent personally by me on the following activities: development of treatment plan with patient and/or surrogate as well as nursing, discussions with consultants, evaluation of patient's response to treatment, examination of patient, obtaining history from patient or surrogate, ordering and performing treatments and interventions, ordering and review of laboratory studies, ordering and review of radiographic studies, pulse oximetry and re-evaluation of patient's condition.

## 2024-01-26 NOTE — TOC Progression Note (Addendum)
 Transition of Care [facility]) - Progression Note    Patient Details  Name: Lindsey Barnes MRN: [id number] Date of Birth: 09/25/1933  Transition [facility] Care [facility]) CM/SW Contact  Crayton Docker, RN Phone Number: [phone] 01/26/2024, 10:24 AM  Clinical Narrative:      The above named patient is recommended to go to Short Term Rehab for strengthening and gait training for balance.  It is expected that the Short Term Rehab stay will be less than 30 days.  The patient is expected to return home after Rehab.    CM follow up in [place] [facility], PASSR is [id number] E expires 02/25/2024.CM call to patient's daughter, Benn Brash, phone: [phone] regarding approved PASSR is approved.  CM alert to Dr. Mariella Shore and RN report number provided to RN Libby Ree. CM call to [employer], phone: [phone] regarding BLS transport at 1800. CM spoke to [place].     CM secure message to Cain Castillo, Admissions, [facility]. Per Cain Castillo no admissions today, SNF can accept tomorrow morning. CM call to [place], CM spoke to [place] regarding transport to tomorrow, 01/27/2024 at 1030. CM follow up call to patient's daughter, Benn Brash, regarding discharge and SNF admission rescheduled to 01/27/2024. Patient's daughter verbalized understanding and agreement. CM alert to Dr. Mariella Shore regarding SNF request for updated discharge summary and discharge date.  Expected Discharge Plan and Services    SNF   Living arrangements for the past 2 months: Single Family Home Expected Discharge Date: 01/24/24                Social Determinants of Health (SDOH) Interventions SDOH Screenings   Food Insecurity: No Food Insecurity (01/19/2024)  Housing: Low Risk  (01/19/2024)  Transportation Needs: No Transportation Needs (01/19/2024)  Utilities: Not At Risk (01/19/2024)  Financial Resource Strain: Patient Declined (04/30/2023)   Received from [facility]  Social Connections: Socially Isolated (01/19/2024)  Tobacco Use: Low Risk   (01/19/2024)    Readmission Risk Interventions    01/21/2024   12:35 PM  Readmission Risk Prevention Plan  Post Dischage Appt Complete  Medication Screening Complete  Transportation Screening Complete

## 2024-01-26 NOTE — TOC Progression Note (Addendum)
 Transition of Care [facility]) - Progression Note    Patient Details  Name: Lindsey Barnes MRN: [id number] Date of Birth: September 22, 1933  [facility]) CM/SW Contact  Crayton Docker, RN Phone Number: 01/26/2024, 9:23 AM  Clinical Narrative:     CM follow up in [place] [facility], 30 day MD/DO certification letter required for rehab/SNF. CM alert to Dr. Mariella Shore regarding certification letter. CM will fax to [facility] upon completion.  Dr. Mariella Shore, signed 30 day rehab/SNF note. CM faxed to  [facility] at [phone]. CM will follow up.  Expected Discharge Plan: Skilled Nursing Facility Barriers to Discharge: Continued Medical Work up  Expected Discharge Plan and Services      SNF Living arrangements for the past 2 months: Single Family Home Expected Discharge Date: 01/24/24                    Social Determinants of Health (SDOH) Interventions SDOH Screenings   Food Insecurity: No Food Insecurity (01/19/2024)  Housing: Low Risk  (01/19/2024)  Transportation Needs: No Transportation Needs (01/19/2024)  Utilities: Not At Risk (01/19/2024)  Financial Resource Strain: Patient Declined (04/30/2023)   Received from [facility]  Social Connections: Socially Isolated (01/19/2024)  Tobacco Use: Low Risk  (01/19/2024)    Readmission Risk Interventions    01/21/2024   12:35 PM  Readmission Risk Prevention Plan  Post Dischage Appt Complete  Medication Screening Complete  Transportation Screening Complete

## 2024-01-26 NOTE — Significant Event (Signed)
 Rapid Response Event Note   Reason for Call :  SOB, AMS  Initial Focused Assessment:  Patient restless with increase work of breathing in the bed. Vital signs stable BP 141/75 HR 92 O2 99% on 2L. Per primary nurse patient was in the bathroom trying to have a bowel movement and when they were walking back to the bed patient became short of breath and words were jumbled. Patient able to answer questions in short sentences. Patient hard of hearing at baseline with one hearing aid in place.  Patient words are clear but winded when patient is struggling to understand what is being asked.  Patient describes pain in back 10/10 feels like lightning. Dr Vallarie Gauze at bedside rated NIH a 0.  Interventions:  -Anxiety and pain management. -Therapeutic communication  -Neuro checks  MD Notified: Dr Vallarie Gauze Call [id number] Arrival Time:0440 End [id number]  Georgana Kilts, RN

## 2024-01-27 ENCOUNTER — Other Ambulatory Visit: Payer: Self-pay | Admitting: Nurse Practitioner

## 2024-01-27 DIAGNOSIS — R109 Unspecified abdominal pain: Secondary | ICD-10-CM | POA: Diagnosis not present

## 2024-01-27 DIAGNOSIS — K1121 Acute sialoadenitis: Secondary | ICD-10-CM | POA: Diagnosis not present

## 2024-01-27 DIAGNOSIS — I69391 Dysphagia following cerebral infarction: Secondary | ICD-10-CM | POA: Diagnosis not present

## 2024-01-27 DIAGNOSIS — D329 Benign neoplasm of meninges, unspecified: Secondary | ICD-10-CM | POA: Diagnosis not present

## 2024-01-27 DIAGNOSIS — R221 Localized swelling, mass and lump, neck: Secondary | ICD-10-CM | POA: Diagnosis not present

## 2024-01-27 DIAGNOSIS — S3282XS Multiple fractures of pelvis without disruption of pelvic ring, sequela: Secondary | ICD-10-CM | POA: Diagnosis not present

## 2024-01-27 DIAGNOSIS — I10 Essential (primary) hypertension: Secondary | ICD-10-CM | POA: Diagnosis not present

## 2024-01-27 DIAGNOSIS — M6281 Muscle weakness (generalized): Secondary | ICD-10-CM | POA: Diagnosis not present

## 2024-01-27 DIAGNOSIS — R1312 Dysphagia, oropharyngeal phase: Secondary | ICD-10-CM | POA: Diagnosis not present

## 2024-01-27 DIAGNOSIS — K1120 Sialoadenitis, unspecified: Secondary | ICD-10-CM | POA: Diagnosis not present

## 2024-01-27 DIAGNOSIS — F32A Depression, unspecified: Secondary | ICD-10-CM | POA: Diagnosis not present

## 2024-01-27 DIAGNOSIS — I63512 Cerebral infarction due to unspecified occlusion or stenosis of left middle cerebral artery: Secondary | ICD-10-CM | POA: Diagnosis not present

## 2024-01-27 DIAGNOSIS — Z7189 Other specified counseling: Secondary | ICD-10-CM | POA: Diagnosis not present

## 2024-01-27 DIAGNOSIS — J918 Pleural effusion in other conditions classified elsewhere: Secondary | ICD-10-CM | POA: Diagnosis not present

## 2024-01-27 DIAGNOSIS — E785 Hyperlipidemia, unspecified: Secondary | ICD-10-CM | POA: Diagnosis not present

## 2024-01-27 DIAGNOSIS — I639 Cerebral infarction, unspecified: Secondary | ICD-10-CM | POA: Diagnosis not present

## 2024-01-27 DIAGNOSIS — R278 Other lack of coordination: Secondary | ICD-10-CM | POA: Diagnosis not present

## 2024-01-27 DIAGNOSIS — Z741 Need for assistance with personal care: Secondary | ICD-10-CM | POA: Diagnosis not present

## 2024-01-27 DIAGNOSIS — Z7401 Bed confinement status: Secondary | ICD-10-CM | POA: Diagnosis not present

## 2024-01-27 DIAGNOSIS — R4189 Other symptoms and signs involving cognitive functions and awareness: Secondary | ICD-10-CM | POA: Diagnosis not present

## 2024-01-27 DIAGNOSIS — S32020G Wedge compression fracture of second lumbar vertebra, subsequent encounter for fracture with delayed healing: Secondary | ICD-10-CM | POA: Diagnosis not present

## 2024-01-27 DIAGNOSIS — S32110D Nondisplaced Zone I fracture of sacrum, subsequent encounter for fracture with routine healing: Secondary | ICD-10-CM | POA: Diagnosis not present

## 2024-01-27 DIAGNOSIS — G4700 Insomnia, unspecified: Secondary | ICD-10-CM | POA: Diagnosis not present

## 2024-01-27 DIAGNOSIS — R1311 Dysphagia, oral phase: Secondary | ICD-10-CM | POA: Diagnosis not present

## 2024-01-27 DIAGNOSIS — Z9181 History of falling: Secondary | ICD-10-CM | POA: Diagnosis not present

## 2024-01-27 DIAGNOSIS — S3282XD Multiple fractures of pelvis without disruption of pelvic ring, subsequent encounter for fracture with routine healing: Secondary | ICD-10-CM | POA: Diagnosis not present

## 2024-01-27 DIAGNOSIS — K219 Gastro-esophageal reflux disease without esophagitis: Secondary | ICD-10-CM | POA: Diagnosis not present

## 2024-01-27 DIAGNOSIS — R2689 Other abnormalities of gait and mobility: Secondary | ICD-10-CM | POA: Diagnosis not present

## 2024-01-27 MED ORDER — DIAZEPAM 2 MG PO TABS: 2.0000 mg | ORAL_TABLET | Freq: Four times a day (QID) | ORAL | 0 refills | Status: DC | PRN

## 2024-01-27 NOTE — Progress Notes (Addendum)
 Progress Note   Patient: Lindsey Barnes WUJ:[id number] DOB: [birth year] DOA: 01/19/2024     8 DOS: the patient was seen and examined on 01/27/2024     Brief hospital course:   Cecilia Vancleve is a 89 y.o. female with medical history significant of Hypertension, GERD, chronic back pain, history of breast cancer, TIA, who comes seen with complaints of back pain with intensity 10/10.  According to patient's daughter present at bedside patient nearly had a syncopal episode today at home.  She initially started complaining of some associated numbness in bilateral hands however this had resolved by the time patient was being seen.  She complained of some abdominal pain mostly in the epigastric region.  Denies nausea vomiting chest pain or urinary complaints.  Patient was recently admitted on 01/05/2024 and was found to have some old compression fracture and at that time managed with pain medication and discharged home however according to patient's daughter patient has continued to be in pain and still not able to walk with her regular walker and therefore brought in for further management. ED course: Temperature 97.6 respiratory rate 30 pulse 74 blood pressure 146/84 saturating on 2 L; 97%.  Given above concerns hospitalist consulted for contacted to admit patient for further management     Assessment and Plan: Acute on chronic intractable back pain CT scan revealing findings of old compression fracture noted at T12 and L2 Patient currently on pain medication Continue MS Contin for base of pain control Continue as needed pain medication Rehab facility bed is now available for discharge today   Hypokalemia-continue repletion and monitoring   GERD Continue Protonix   Hyperlipidemia Continue statin therapy     HTN (hypertension) Continue home antihypertensives   DVT prophylaxis-continue Lovenox    Advance Care Planning:   Code Status: Prior DNR   Consults: Neurosurgery    Family Communication: Discussed with patient daughter at bedside     Subjective:  Patient seen and examined at bedside this morning Patient is about to be taken to the rehab today She had a peaceful night with no acute complaints   Physical Exam:   Appearance: She is normal weight.  HENT:     Head: Normocephalic and atraumatic.     Nose: Nose normal.     Mouth/Throat:     Mouth: Mucous membranes are moist.  Eyes:     Pupils: Pupils are equal, round, and reactive to light.  Cardiovascular:     Rate and Rhythm: Normal rate and regular rhythm.  Pulmonary:     Effort: Pulmonary effort is normal.  Abdominal:     General: Bowel sounds are normal.  Musculoskeletal:        General: Normal range of motion.  Skin:    General: Skin is warm and dry.  Neurological:     General: No focal deficit present.  Psychiatric:        Mood and Affect: Mood normal.      Data Reviewed:        Latest Ref Rng & Units 01/24/2024    2:10 AM 01/23/2024    4:21 AM 01/22/2024    5:40 AM  CBC  WBC 4.0 - 10.5 K/uL 4.8  5.3  6.7   Hemoglobin 12.0 - 15.0 g/dL 95.6  21.3  08.6   Hematocrit 36.0 - 46.0 % 37.9  38.6  37.9   Platelets 150 - 400 K/uL 223  223  243      Vitals:   01/26/24  1652 01/26/24 2119 01/27/24 0438 01/27/24 1005  BP: 131/76 130/84 137/81 131/75  Pulse: 91 86 81 85  Resp: 16 17 15 16   Temp: 97.7 F (36.5 C) 98.2 F (36.8 C) 97.6 F (36.4 C) 97.9 F (36.6 C)  TempSrc: Oral     SpO2: 98% 99% 94% 100%  Weight:      Height:        Author: Ezzard Holms, MD 01/27/2024 10:32 AM  For on call review [email or web address].

## 2024-01-27 NOTE — H&P (Signed)
 IV removed intact. Report given to Nurse Jonel Nephew at [facility] room 116.

## 2024-01-27 NOTE — TOC Transition Note (Signed)
 Transition of Care [facility]) - Discharge Note   Patient Details  Name: Lindsey Barnes MRN: [id number] Date of Birth: 1933/05/02  Transition of Care [facility]) CM/SW Contact:  Crayton Docker, RN 01/27/2024, 9:10 AM   Clinical Narrative:     Discharge orders noted. Discharge summary, discharge orders and SNF transfer report faxed to [facility]. CM follow up call placed to [facility], confirmed BLS transport for 1030 this morning. CM spoke to [place]. CM confirmed with patient's daughter, Benn Brash, [facility] for 1030 to [facility]. CM secure message to Cain Castillo, Admissions, [place] regarding discharge summary faxed and BLS scheduled pick up time of 1030.  Final next level of care: Skilled Nursing Facility Barriers to Discharge: No Barriers Identified   Patient Goals and CMS Choice Patient states their goals for this hospitalization and ongoing recovery are:: SNF for STR CMS Medicare.gov Compare Post Acute Care list provided to:: Patient Represenative (must comment) Choice offered to / list presented to : Adult Children      Discharge Placement        SNF         Discharge Plan and Services Additional resources added to the After Visit Summary for      SNF       Social Drivers of Health (SDOH) Interventions SDOH Screenings   Food Insecurity: No Food Insecurity (01/19/2024)  Housing: Low Risk  (01/19/2024)  Transportation Needs: No Transportation Needs (01/19/2024)  Utilities: Not At Risk (01/19/2024)  Financial Resource Strain: Patient Declined (04/30/2023)   Received from [facility]  Social Connections: Socially Isolated (01/19/2024)  Tobacco Use: Low Risk  (01/19/2024)     Readmission Risk Interventions    01/21/2024   12:35 PM  Readmission Risk Prevention Plan  Post Dischage Appt Complete  Medication Screening Complete  Transportation Screening Complete

## 2024-01-30 ENCOUNTER — Telehealth: Payer: Self-pay | Admitting: Family

## 2024-01-30 NOTE — Telephone Encounter (Signed)
 [facility] facility Nurse called to report patient aspirated during med pass.patient reported to Nurse that pill seemed to be stuck on the throat and can't breath well.Apple sauce and water was given,patient swallowed without any difficulties.she complains of esophageal pain.No cough or shortness of breath noted by Nurse.Vital signs stable B/p 121/78 and oxygen saturation 95%.Portable chest X-ray ordered.Please follow up.

## 2024-01-31 ENCOUNTER — Non-Acute Institutional Stay (SKILLED_NURSING_FACILITY): Payer: Self-pay | Admitting: Student

## 2024-01-31 DIAGNOSIS — Z7189 Other specified counseling: Secondary | ICD-10-CM

## 2024-01-31 DIAGNOSIS — R221 Localized swelling, mass and lump, neck: Secondary | ICD-10-CM

## 2024-01-31 DIAGNOSIS — D329 Benign neoplasm of meninges, unspecified: Secondary | ICD-10-CM

## 2024-01-31 DIAGNOSIS — I639 Cerebral infarction, unspecified: Secondary | ICD-10-CM | POA: Diagnosis not present

## 2024-01-31 DIAGNOSIS — R109 Unspecified abdominal pain: Secondary | ICD-10-CM | POA: Diagnosis not present

## 2024-01-31 LAB — BASIC METABOLIC PANEL WITH GFR
BUN: 25 — AB (ref 4–21)
CO2: 33 — AB (ref 13–22)
Chloride: 92 — AB (ref 99–108)
Creatinine: 0.7 (ref 0.5–1.1)
Glucose: 143
Potassium: 3.6 meq/L (ref 3.5–5.1)
Sodium: 133 — AB (ref 137–147)

## 2024-01-31 LAB — CBC AND DIFFERENTIAL
HCT: 39 (ref 36–46)
Hemoglobin: 12.8 (ref 12.0–16.0)
Neutrophils Absolute: 10231
Platelets: 202 10*3/uL (ref 150–400)
WBC: 11.8

## 2024-01-31 LAB — HEPATIC FUNCTION PANEL
ALT: 18 U/L (ref 7–35)
AST: 12 — AB (ref 13–35)
Alkaline Phosphatase: 183 — AB (ref 25–125)
Bilirubin, Total: 1

## 2024-01-31 LAB — COMPREHENSIVE METABOLIC PANEL WITH GFR
Albumin: 3.5 (ref 3.5–5.0)
Calcium: 8.4 — AB (ref 8.7–10.7)
Globulin: 2.1
eGFR: 83

## 2024-01-31 LAB — CBC: RBC: 4.19 (ref 3.87–5.11)

## 2024-02-01 ENCOUNTER — Encounter: Payer: Self-pay | Admitting: Nurse Practitioner

## 2024-02-01 ENCOUNTER — Encounter: Payer: Self-pay | Admitting: Student

## 2024-02-01 ENCOUNTER — Non-Acute Institutional Stay (SKILLED_NURSING_FACILITY): Payer: Self-pay | Admitting: Nurse Practitioner

## 2024-02-01 DIAGNOSIS — K1120 Sialoadenitis, unspecified: Secondary | ICD-10-CM | POA: Diagnosis not present

## 2024-02-01 DIAGNOSIS — D329 Benign neoplasm of meninges, unspecified: Secondary | ICD-10-CM | POA: Insufficient documentation

## 2024-02-01 NOTE — Progress Notes (Signed)
 Location:  Other [facility].  Nursing Home Room Number: [facility] [address] Place of Service:  SNF ([phone]) Gilbert Lab, NP  PCP: Rory Collard, MD  Patient Care Team: Rory Collard, MD as PCP - General [facility])  Extended Emergency Contact Information Primary Emergency Contact: [facility] Address: [address]          [place], [place] [address] Home Phone: [phone] Relation: Daughter  Goals of care: Advanced Directive information    01/19/2024    8:00 PM  Advanced Directives  Does Patient Have a Medical Advance Directive? Yes  Type of Advance Directive Out of facility DNR (pink MOST or yellow form)  Does patient want to make changes to medical advance directive? No - Patient declined  Copy of Healthcare Power of Attorney in Chart? No - copy requested     Chief Complaint  Patient presents with   Increase WBC    Increase WBC    HPI:  Pt is a 89 y.o. female seen today for an acute visit for Increase WBC.  Pt was seen yesterday due to redness and swelling of left neck.  She has a medical history of HTN, GERD, chronic back pain, history of breast cancer, TIA, who went to the hospital with after a fall and back pain noted to have compression fractures, she went home but unable to manage pain so went back to the hospital for pain management and then was discharge to [facility] for rehab.  She has also had recent CVA with swallowing difficulties, worse over the last 4 days.  ST has been consulted in facility.   US  of neck and blood work completed yesterday.  Cbc noted to have elevated wbc of 11.8.  Pt denies fever or chills.  She denies foul odor or odd taste in her mouth. She just reports difficulty swallowing.  She is very HOH.   Past Medical History:  Diagnosis Date   Anxiety    Breast cancer (HCC)    1990   Complication of anesthesia    Depression    Dyspnea    DOE   GERD (gastroesophageal reflux disease)    Headache(784.0)    HOH (hard of  hearing)    Hypertension    Lymphoma (HCC)    Pain    CHRONIC BACK   Pain    CHRONIC BACK   Pneumonia    PONV (postoperative nausea and vomiting)    Tremors of nervous system    HANDS OCCAS   Past Surgical History:  Procedure Laterality Date   APPENDECTOMY     BACK SURGERY     BREAST SURGERY     CATARACT EXTRACTION W/PHACO Left 01/26/2017   Procedure: CATARACT EXTRACTION PHACO AND INTRAOCULAR LENS PLACEMENT (IOC);  Surgeon: Clair Crews, MD;  Location: [facility] ORS;  Service: Ophthalmology;  Laterality: Left;  US  53.7 AP% 20.3 CDE 10.90 Fluid pack lot # [id number]   CATARACT EXTRACTION W/PHACO Right 02/16/2017   Procedure: CATARACT EXTRACTION PHACO AND INTRAOCULAR LENS PLACEMENT (IOC);  Surgeon: Clair Crews, MD;  Location: [facility] ORS;  Service: Ophthalmology;  Laterality: Right;  US  00:55 AP% 22.5 CDE 12.43 Fluid pack lot # [id number]   CESAREAN SECTION     CHOLECYSTECTOMY N/A 03/20/2018   Procedure: LAPAROSCOPIC CHOLECYSTECTOMY;  Surgeon: Eldred Grego, MD;  Location: [facility] ORS;  Service: General;  Laterality: N/A;   MASTECTOMY Right 1992   TUBAL LIGATION      Allergies  Allergen Reactions   Amoxicillin Swelling    Blisters.  Aspirin Other (See Comments)    Stomach ulcers.   Benadryl [Diphenhydramine Hcl] Other (See Comments)    Hyperactive.    Outpatient Encounter Medications as of 02/01/2024  Medication Sig   acetaminophen  (TYLENOL ) 325 MG tablet Take 2 tablets (650 mg total) by mouth every 6 (six) hours as needed for mild pain (pain score 1-3), fever or headache.   atorvastatin  (LIPITOR) 40 MG tablet Take 1 tablet (40 mg total) by mouth daily.   cholecalciferol (VITAMIN D) 1000 units tablet Take 2,000 Units by mouth daily.   clopidogrel  (PLAVIX ) 75 MG tablet Take 1 tablet (75 mg total) by mouth daily.   diazepam  (VALIUM ) 2 MG tablet Take 1 tablet (2 mg total) by mouth every 6 (six) hours as needed for anxiety.   docusate sodium  (COLACE) 100 MG capsule Take  100 mg by mouth daily as needed for mild constipation or moderate constipation.   fluticasone  (FLONASE ) 50 MCG/ACT nasal spray Place 2 sprays into both nostrils daily.   hydrochlorothiazide  (HYDRODIURIL ) 25 MG tablet Take 25 mg by mouth daily.   KLOR-CON  M20 20 MEQ tablet Take 20 mEq by mouth in the morning and at bedtime.   meclizine  (ANTIVERT ) 25 MG tablet Take 25 mg by mouth 3 (three) times daily as needed. For dizziness   OXYGEN 2lpm every shift   polyethylene glycol (MIRALAX  / GLYCOLAX ) 17 g packet Take 17 g by mouth daily.   propranolol (INDERAL) 20 MG tablet Take 20 mg by mouth 2 (two) times daily.   Sertraline  HCl 150 MG CAPS Take 150 mg by mouth 2 (two) times daily.   traZODone  (DESYREL ) 50 MG tablet Take 50 mg by mouth at bedtime.   vitamin B-12 (CYANOCOBALAMIN ) 500 MCG tablet Take 500 mcg by mouth daily.   scopolamine (TRANSDERM-SCOP) 1 MG/3DAYS Place 1 patch onto the skin every 3 (three) days. As needed (Patient not taking: Reported on 02/01/2024)   No facility-administered encounter medications on file as of 02/01/2024.    Review of Systems  Constitutional:  Positive for appetite change and fatigue. Negative for activity change and unexpected weight change.  HENT:  Positive for facial swelling (left side of face) and trouble swallowing. Negative for congestion, dental problem, drooling and hearing loss.   Eyes: Negative.   Respiratory:  Negative for cough and shortness of breath.   Cardiovascular:  Negative for chest pain, palpitations and leg swelling.  Gastrointestinal:  Negative for abdominal pain, constipation and diarrhea.  Genitourinary:  Negative for difficulty urinating and dysuria.  Musculoskeletal:  Positive for back pain. Negative for arthralgias and myalgias.  Skin:  Positive for color change (redness noted to left side of face).  Neurological:  Negative for dizziness.    Immunization History  Administered Date(s) Administered   Influenza, High Dose Seasonal PF  07/14/2017, 07/26/2023   PFIZER Comirnaty(Gray Top)Covid-19 Tri-Sucrose Vaccine 10/19/2019, 11/09/2019, 10/17/2020, 04/16/2021   PFIZER(Purple Top)SARS-COV-2 Vaccination 10/19/2019, 11/09/2019   Pneumococcal-Unspecified 04/10/2000   Rsv, Bivalent, Protein Subunit Rsvpref,pf Pattricia Bores) 08/02/2023   Tdap 03/29/2014, 07/09/2015   Zoster, Unspecified 04/16/2021   Pertinent  Health Maintenance Due  Topic Date Due   DEXA SCAN  Never done   INFLUENZA VACCINE  05/12/2024      01/17/2021    3:00 PM 01/17/2021   10:00 PM 01/18/2021   12:00 PM 01/18/2021    8:30 PM 01/19/2021    9:00 AM  Fall Risk  (RETIRED) Patient Fall Risk Level High fall risk High fall risk High fall risk High fall risk  High fall risk   Functional Status Survey:    Vitals:   02/01/24 1436  BP: (!) 114/58  Pulse: 65  Resp: 18  Temp: (!) 97.1 F (36.2 C)  SpO2: 97%  Weight: 183 lb (83 kg)  Height: 5\' 8"  (1.727 m)   Body mass index is 27.83 kg/m. Physical Exam Constitutional:      General: She is not in acute distress.    Appearance: She is well-developed. She is not diaphoretic.  HENT:     Head: Normocephalic and atraumatic.     Salivary Glands: Left salivary gland is diffusely enlarged and tender.     Comments: Redness and tenderness noted to left side of the face from left ear down jaw line.     Mouth/Throat:     Pharynx: No oropharyngeal exudate.  Eyes:     Conjunctiva/sclera: Conjunctivae normal.     Pupils: Pupils are equal, round, and reactive to light.  Cardiovascular:     Rate and Rhythm: Normal rate and regular rhythm.     Heart sounds: Normal heart sounds.  Pulmonary:     Effort: Pulmonary effort is normal.     Breath sounds: Normal breath sounds.  Abdominal:     General: Bowel sounds are normal.     Palpations: Abdomen is soft.  Musculoskeletal:     Cervical back: Normal range of motion and neck supple.     Right lower leg: No edema.     Left lower leg: No edema.  Lymphadenopathy:     Head:      Left side of head: Submandibular adenopathy present.  Skin:    General: Skin is warm and dry.  Neurological:     Mental Status: She is alert.  Psychiatric:        Mood and Affect: Mood normal.     Labs reviewed: Recent Labs    01/22/24 0540 01/23/24 0421 01/24/24 0210 01/26/24 0442 01/31/24 0000  NA 136 137 135  --  133*  K 2.8* 4.3 4.0  --  3.6  CL 96* 98 95*  --  92*  CO2 32 31 32  --  33*  GLUCOSE 130* 116* 114*  --   --   BUN 21 21 21   --  25*  CREATININE 0.73 0.71 0.66 0.75 0.7  CALCIUM  7.7* 8.0* 8.1*  --  8.4*  MG 2.3  --   --   --   --    Recent Labs    01/06/24 0436 01/19/24 0948 01/31/24 0000  AST 16 19 12*  ALT 14 13 18   ALKPHOS 47 131* 183*  BILITOT 1.0 1.0  --   PROT 5.7* 6.6  --   ALBUMIN 3.1* 3.5 3.5   Recent Labs    01/22/24 0540 01/23/24 0421 01/24/24 0210 01/31/24 0000  WBC 6.7 5.3 4.8 11.8  NEUTROABS 5.0 3.2 3.1 10,231.00  HGB 12.8 12.5 12.3 12.8  HCT 37.9 38.6 37.9 39  MCV 92.7 96.3 95.0  --   PLT 243 223 223 202   No results found for: "TSH" Lab Results  Component Value Date   HGBA1C 5.3 01/05/2024   Lab Results  Component Value Date   CHOL 146 01/06/2024   HDL 42 01/06/2024   LDLCALC 86 01/06/2024   TRIG 89 01/06/2024   CHOLHDL 3.5 01/06/2024    Significant Diagnostic Results in last 30 days:  MR LUMBAR SPINE WO CONTRAST Result Date: 01/19/2024 CLINICAL DATA:  Initial evaluation for low back pain, trauma,  multiple compression deformities. EXAM: MRI LUMBAR SPINE WITHOUT CONTRAST TECHNIQUE: Multiplanar, multisequence MR imaging of the lumbar spine was performed. No intravenous contrast was administered. COMPARISON:  Prior CT from 01/05/2024 as well as previous MRI from 01/01/2005. FINDINGS: Segmentation: Standard. Lowest well-formed disc space labeled the L5-S1 level. Alignment: Mild exaggeration of the normal lumbar lordosis with kyphotic angulation at the thoracolumbar junction. Underlying mild levoscoliosis. No significant  listhesis. Vertebrae: Chronic compression fracture involving the T12 vertebral body with severe central height loss and 4 mm bony retropulsion. Additional chronic compression fracture of L2 with sequelae of prior vertebral augmentation. Up to 40% height loss with 4 mm bony retropulsion at this fracture. Otherwise, vertebral body height maintained with no other acute or recent fracture. There is question of abnormal marrow edema within the partially visualized right sacral ala on sagittal STIR sequence (series 6, image 3). Finding raises the possibility for an acute nondisplaced sacral fracture, although this is incompletely assessed on this exam. Underlying bone marrow signal intensity within normal limits. No worrisome osseous lesions. No other abnormal marrow edema. Conus medullaris and cauda equina: Conus extends to the L1 level. Conus and cauda equina appear normal. Few scattered Tarlov cyst noted posterior to the sacrum. Paraspinal and other soft tissues: Unremarkable. Disc levels: T12-L1: 4 mm bony retropulsion related to the chronic T12 fracture. Underlying disc desiccation with mild disc bulge. Mild facet hypertrophy. No significant spinal stenosis. Foramina remain patent. L1-2: 4 mm bony retropulsion related to the chronic L2 fracture. Disc desiccation without significant disc bulge. Mild facet hypertrophy. No significant spinal stenosis. Foramina remain patent. L2-3: Disc desiccation with minimal disc bulge. Mild facet and ligament flavum hypertrophy. No significant spinal stenosis. Foramina remain patent. L3-4: Disc desiccation with mild disc bulge. Mild to moderate facet and ligament flavum hypertrophy. No significant spinal stenosis. Foramina remain patent. L4-5: Mild degenerative vertebral disc space narrowing with diffuse disc bulge and disc desiccation. Reactive endplate spurring. Mild bilateral facet hypertrophy. No significant spinal stenosis. Mild bilateral L4 foraminal narrowing. L5-S1: Disc  desiccation without significant disc bulge. Moderate bilateral facet hypertrophy. No spinal stenosis. Foramina remain patent. IMPRESSION: 1. Question abnormal marrow edema within the partially visualized right sacral ala, raising the possibility for an acute/recent nondisplaced sacral fracture. Correlation with physical exam recommended. Additionally, follow-up examination with dedicated MRI of the sacrum could be performed for further evaluation as warranted. 2. No other acute abnormality within the lumbar spine. 3. Chronic compression fractures of T12 and L2 with 4 mm bony retropulsion, and sequelae of prior vertebral augmentation at L2. No significant spinal stenosis or neural impingement at these levels. 4. Mild noncompressive disc bulging with facet hypertrophy at L3-4 and L4-5 with resultant mild bilateral L4 foraminal stenosis. Electronically Signed   By: Virgia Griffins M.D.   On: 01/19/2024 20:03   CT Angio Abd/Pel W and/or Wo Contrast Result Date: 01/19/2024 CLINICAL DATA:  Diffuse abdominal pain. Diffuse chest pain. Back pain. Shortness of breath. Clinical concerns include mesenteric ischemia. EXAM: CTA ABDOMEN AND PELVIS WITHOUT AND WITH CONTRAST TECHNIQUE: Multidetector CT imaging of the abdomen and pelvis was performed using the standard protocol during bolus administration of intravenous contrast. Multiplanar reconstructed images and MIPs were obtained and reviewed to evaluate the vascular anatomy. RADIATION DOSE REDUCTION: This exam was performed according to the departmental dose-optimization program which includes automated exposure control, adjustment of the mA and/or kV according to patient size and/or use of iterative reconstruction technique. CONTRAST:  OMNIPAQUE  IOHEXOL  350 MG/ML SOLN COMPARISON:  None  Available. FINDINGS: VASCULAR Aorta: Atheromatous calcifications without aneurysm or dissection. Celiac: Patent without evidence of aneurysm, dissection, vasculitis or significant  stenosis. No branch occlusions. SMA: Atheromatous calcifications at the origin without significant luminal narrowing. No branch occlusions. Renals: Calcified plaque at the origin of the left renal artery causing approximately 50% luminal narrowing. Anterior origin of the right renal artery without stenosis. IMA: Patent without evidence of aneurysm, dissection, vasculitis or significant stenosis. Inflow: Patent without evidence of aneurysm, dissection, vasculitis or significant stenosis. Proximal Outflow: Bilateral common femoral and visualized portions of the superficial and profunda femoral arteries are patent without evidence of aneurysm, dissection, vasculitis or significant stenosis. Veins: No obvious venous abnormality within the limitations of this arterial phase study. Review of the MIP images confirms the above findings. NON-VASCULAR Lower chest: Borderline enlarged heart. Mild right and minimal left dependent atelectasis. Small right pleural effusion. Hepatobiliary: Small liver cysts. These do not need imaging follow-up. Cholecystectomy clips. Pancreas: Moderate diffuse pancreatic atrophy. Spleen: Normal in size without focal abnormality. Adrenals/Urinary Tract: Normal-appearing adrenal glands. Left renal parapelvic cysts. These do not need imaging follow-up. Unremarkable right kidney, ureters and urinary bladder. Stomach/Bowel: Moderate-sized hiatal hernia. Surgically absent appendix. Unremarkable small bowel and colon. Lymphatic: No enlarged lymph nodes. Reproductive: Uterus and bilateral adnexa are unremarkable. Other: No abdominal wall hernia or abnormality. No abdominopelvic ascites. Musculoskeletal: Lumbar and lower thoracic spine degenerative changes. Old L2 vertebral compression deformity with kyphoplasty material. Associated bony retropulsion causing moderate spinal stenosis. Old T12 compression deformity with associated bony retropulsion causing mild spinal stenosis. Moderate foraminal stenosis on  the left at the T12-L1 level due to a combination of the bony retropulsion and facet degenerative changes. Multifactorial moderate spinal stenosis and moderate right and moderate-to-marked left foraminal stenosis at the L4-5 level. No acute fractures or subluxations. IMPRESSION: 1. No evidence of mesenteric ischemia. 2. Approximately 50% stenosis at the origin of the left renal artery. 3. Moderate-sized hiatal hernia. 4. Small right pleural effusion. 5. Borderline cardiomegaly. 6. Old T12 and L2 vertebral compression deformities with associated bony retropulsion causing mild spinal stenosis at T12 and moderate spinal stenosis at L2. 7. Moderate foraminal stenosis on the left at T12-L1. 8. Multifactorial moderate spinal stenosis and moderate right and moderate to marked left foraminal stenosis at L4-5. 9. Aortic atherosclerosis. Electronically Signed   By: Catherin Closs M.D.   On: 01/19/2024 13:37   DG Chest Portable 1 View Result Date: 01/19/2024 CLINICAL DATA:  Chest pain. EXAM: PORTABLE CHEST 1 VIEW COMPARISON:  January 05, 2024. FINDINGS: Stable cardiomediastinal silhouette. Both lungs are clear. The visualized skeletal structures are unremarkable. IMPRESSION: No active disease. Electronically Signed   By: Rosalene Colon M.D.   On: 01/19/2024 11:59   EEG adult Result Date: 01/07/2024 Eleni Griffin, MD     01/07/2024  2:34 PM Routine EEG Report Jeffifer Rabold is a 89 y.o. female with a history of syncope who is undergoing an EEG to evaluate for seizures. Report: This EEG was acquired with electrodes placed according to the International 10-20 electrode system (including Fp1, Fp2, F3, F4, C3, C4, P3, P4, O1, O2, T3, T4, T5, T6, A1, A2, Fz, Cz, Pz). The following electrodes were missing or displaced: none. The occipital dominant rhythm was 8.5 Hz. This activity is reactive to stimulation. Drowsiness was manifested by background fragmentation; deeper stages of sleep were identified by K complexes and  sleep spindles. There was no focal slowing. There were no interictal epileptiform discharges. There were no electrographic seizures  identified. Impression: This EEG was obtained while awake and asleep and is normal.   Clinical Correlation: Normal EEGs, however, do not rule out epilepsy. Greg Leaks, MD [facility] [phone] If 7pm- 7am, please page neurology on call as listed in AMION.   CT ANGIO HEAD NECK W WO CM Result Date: 01/06/2024 CLINICAL DATA:  acute neuro def EXAM: CT ANGIOGRAPHY HEAD AND NECK WITH AND WITHOUT CONTRAST TECHNIQUE: Multidetector CT imaging of the head and neck was performed using the standard protocol during bolus administration of intravenous contrast. Multiplanar CT image reconstructions and MIPs were obtained to evaluate the vascular anatomy. Carotid stenosis measurements (when applicable) are obtained utilizing NASCET criteria, using the distal internal carotid diameter as the denominator. RADIATION DOSE REDUCTION: This exam was performed according to the departmental dose-optimization program which includes automated exposure control, adjustment of the mA and/or kV according to patient size and/or use of iterative reconstruction technique. CONTRAST:  75mL OMNIPAQUE  IOHEXOL  350 MG/ML SOLN COMPARISON:  MRI head January 05, 2024. FINDINGS: CT HEAD FINDINGS Brain: Known acute infarct and known meningiomas better characterized on recent MRI. No evidence of acute/interval large vascular territory infarct, acute hemorrhage, or midline shift. Vascular: See below. Skull: No acute fracture. Sinuses/Orbits: No acute finding. Other: No mastoid effusions. Review of the MIP images confirms the above findings CTA NECK FINDINGS Aortic arch: Great vessel origins are patent without significant stenosis. Aortic atherosclerosis. Right carotid system: No evidence of dissection, stenosis (50% or greater), or occlusion. Left carotid system: No evidence of dissection, stenosis (50% or  greater), or occlusion. Vertebral arteries: Codominant. No evidence of dissection, stenosis (50% or greater), or occlusion. Skeleton: No acute abnormality on limited assessment. Other neck: No acute abnormality on limited assessment. Upper chest: Visualized lung apices are clear. Review of the MIP images confirms the above findings CTA HEAD FINDINGS Anterior circulation: Bilateral intracranial ICAs, MCAs, and ACAs are patent without proximal hemodynamically significant stenosis. Posterior circulation: Bilateral intradural vertebral arteries, basilar artery and bilateral posterior shoulders are patent without proximal hemodynamically significant stenosis. Venous sinuses: As permitted by contrast timing, patent. Review of the MIP images confirms the above findings IMPRESSION: 1. No emergent large vessel occlusion or proximal hemodynamically significant stenosis. 2. Known acute infarct and meningiomas better characterized on recent MRI. 3.  Aortic Atherosclerosis (ICD10-I70.0). Electronically Signed   By: Stevenson Elbe M.D.   On: 01/06/2024 23:31   ECHOCARDIOGRAM COMPLETE Result Date: 01/05/2024    ECHOCARDIOGRAM REPORT   Patient Name:   GLENA PHARRIS [facility] Date of Exam: 01/05/2024 Medical Rec #:  [id number]             Height:       67.0 in Accession #:    [id number]            Weight:       191.0 lb Date of Birth:  12-Jan-1933            BSA:          1.983 m Patient Age:    89 years              BP:           107/72 mmHg Patient Gender: F                     HR:           71 bpm. Exam Location:  [facility] Procedure: 2D Echo, Cardiac Doppler, Color Doppler and Strain Analysis (Both  Spectral and Color Flow Doppler were utilized during procedure). Indications:     Syncope  History:         Patient has prior history of Echocardiogram examinations, most                  recent 01/17/2021. Stroke and TIA, Signs/Symptoms:Syncope; Risk                  Factors:Hypertension.  Sonographer:     Clarke Crouch  Referring Phys:  [phone] Alayne Allis [place]ON Diagnosing Phys: Belva Boyden MD  Sonographer Comments: Global longitudinal strain was attempted. IMPRESSIONS  1. Left ventricular ejection fraction, by estimation, is 60 to 65%. The left ventricle has normal function. The left ventricle has no regional wall motion abnormalities. Left ventricular diastolic parameters are consistent with Grade I diastolic dysfunction (impaired relaxation).  2. Right ventricular systolic function is normal. The right ventricular size is normal. There is normal pulmonary artery systolic pressure. The estimated right ventricular systolic pressure is 29.0 mmHg.  3. The mitral valve is normal in structure. Moderate mitral valve regurgitation. No evidence of mitral stenosis.  4. The aortic valve is normal in structure. Aortic valve regurgitation is not visualized. No aortic stenosis is present.  5. The inferior vena cava is normal in size with greater than 50% respiratory variability, suggesting right atrial pressure of 3 mmHg. FINDINGS  Left Ventricle: Left ventricular ejection fraction, by estimation, is 60 to 65%. The left ventricle has normal function. The left ventricle has no regional wall motion abnormalities. Global longitudinal strain performed but not reported based on interpreter judgement due to suboptimal tracking. The left ventricular internal cavity size was normal in size. There is no left ventricular hypertrophy. Left ventricular diastolic parameters are consistent with Grade I diastolic dysfunction (impaired relaxation). Right Ventricle: The right ventricular size is normal. No increase in right ventricular wall thickness. Right ventricular systolic function is normal. There is normal pulmonary artery systolic pressure. The tricuspid regurgitant velocity is 2.55 m/s, and  with an assumed right atrial pressure of 3 mmHg, the estimated right ventricular systolic pressure is 29.0 mmHg. Left Atrium: Left atrial size was normal in size.  Right Atrium: Right atrial size was normal in size. Pericardium: There is no evidence of pericardial effusion. Mitral Valve: The mitral valve is normal in structure. Moderate mitral valve regurgitation. No evidence of mitral valve stenosis. MV peak gradient, 3.4 mmHg. The mean mitral valve gradient is 2.0 mmHg. Tricuspid Valve: The tricuspid valve is normal in structure. Tricuspid valve regurgitation is mild . No evidence of tricuspid stenosis. Aortic Valve: The aortic valve is normal in structure. Aortic valve regurgitation is not visualized. No aortic stenosis is present. Aortic valve mean gradient measures 7.0 mmHg. Aortic valve peak gradient measures 13.4 mmHg. Aortic valve area, by VTI measures 1.88 cm. Pulmonic Valve: The pulmonic valve was normal in structure. Pulmonic valve regurgitation is not visualized. No evidence of pulmonic stenosis. Aorta: The aortic root is normal in size and structure. Venous: The inferior vena cava is normal in size with greater than 50% respiratory variability, suggesting right atrial pressure of 3 mmHg. IAS/Shunts: No atrial level shunt detected by color flow Doppler. Additional Comments: 3D was performed not requiring image post processing on an independent workstation and was indeterminate.  LEFT VENTRICLE PLAX 2D LVIDd:         4.20 cm   Diastology LVIDs:         2.90 cm   LV e' medial:  4.68 cm/s LV PW:         1.50 cm   LV E/e' medial:  16.7 LV IVS:        1.20 cm   LV e' lateral:   6.20 cm/s LVOT diam:     2.00 cm   LV E/e' lateral: 12.6 LV SV:         66 LV SV Index:   33        2D Longitudinal Strain LVOT Area:     3.14 cm  2D Strain GLS Avg:     -13.1 %  RIGHT VENTRICLE RV Basal diam:  3.55 cm RV Mid diam:    3.00 cm RV S prime:     11.90 cm/s TAPSE (M-mode): 2.1 cm LEFT ATRIUM             Index        RIGHT ATRIUM          Index LA diam:        4.10 cm 2.07 cm/m   RA Area:     9.65 cm LA Vol (A2C):   48.5 ml 24.46 ml/m  RA Volume:   26.81 ml 13.52 ml/m LA Vol  (A4C):   60.6 ml 30.56 ml/m LA Biplane Vol: 58.8 ml 29.65 ml/m  AORTIC VALVE                     PULMONIC VALVE AV Area (Vmax):    2.03 cm      PV Vmax:       1.19 m/s AV Area (Vmean):   1.72 cm      PV Peak grad:  5.7 mmHg AV Area (VTI):     1.88 cm AV Vmax:           183.00 cm/s AV Vmean:          120.000 cm/s AV VTI:            0.353 m AV Peak Grad:      13.4 mmHg AV Mean Grad:      7.0 mmHg LVOT Vmax:         118.00 cm/s LVOT Vmean:        65.600 cm/s LVOT VTI:          0.211 m LVOT/AV VTI ratio: 0.60  AORTA Ao Root diam: 3.10 cm Ao Asc diam:  3.60 cm MITRAL VALVE               TRICUSPID VALVE MV Area (PHT): 2.47 cm    TR Peak grad:   26.0 mmHg MV Area VTI:   2.34 cm    TR Vmax:        255.00 cm/s MV Peak grad:  3.4 mmHg MV Mean grad:  2.0 mmHg    SHUNTS MV Vmax:       0.92 m/s    Systemic VTI:  0.21 m MV Vmean:      57.1 cm/s   Systemic Diam: 2.00 cm MV Decel Time: 307 msec MV E velocity: 78.00 cm/s MV A velocity: 98.50 cm/s MV E/A ratio:  0.79 Belva Boyden MD Electronically signed by Belva Boyden MD Signature Date/Time: 01/05/2024/5:43:46 PM    Final    MR BRAIN WO CONTRAST Result Date: 01/05/2024 CLINICAL DATA:  Mental status change, history of TIA, fall and syncopal event. EXAM: MRI HEAD WITHOUT CONTRAST TECHNIQUE: Multiplanar, multiecho pulse sequences of the brain and surrounding structures were obtained without intravenous contrast. COMPARISON:  Earlier  same day CT head, MRI brain 01/21/2022 FINDINGS: Brain: Punctate focus of restricted diffusion in the cortex of the posterior left frontal lobe. No evidence of intracranial hemorrhage. Redemonstration of a 1.9 x 1.3 x 0.9 cm extra-axial mass over the posterior right frontal lobe near the vertex, similar to prior when remeasured in a similar manner. Minimal mass effect on the adjacent parenchyma. No signal abnormality or edema within the adjacent parenchyma. Additional extra-axial mass along the anterior falx abutting the anterior parasagittal  right frontal lobe measuring 0.8 x 0.7 x 0.8 cm, similar to prior. Scattered and confluent FLAIR signal abnormality in the periventricular and subcortical white matter. 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 No midline shift. Normal appearance of midline structures. The basilar cisterns are patent. No extra-axial fluid collections. Ventricles: Normal size and configuration of the ventricles. Vascular: Skull base flow voids are visualized. Skull and upper cervical spine: No focal abnormality. Sinuses/Orbits: Orbits are symmetric. Paranasal sinuses are clear. Other: Mastoid air cells are clear. IMPRESSION: Punctate focus of acute infarct involving the cortex of the posterior left frontal lobe. Similar appearance of meningiomas over the right frontal lobe and anterior falx. No midline shift. Moderate chronic microvascular ischemic changes. 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 Electronically Signed   By: Denny Flack M.D.   On: 01/05/2024 10:51   CT Thoracic Spine Wo Contrast Addendum Date: 01/05/2024 ADDENDUM REPORT: 01/05/2024 00:57 ADDENDUM: Correlation with prior lumbar spine radiographs from 01/17/2021 show no change to the T12 compression fracture. No acute compression is indicated. Electronically Signed   By: Boyce Byes M.D.   On: 01/05/2024 00:57   Result Date: 01/05/2024 CLINICAL DATA:  Back trauma after a fall. EXAM: CT THORACIC SPINE WITHOUT CONTRAST TECHNIQUE: Multidetector CT images of the thoracic were obtained using the standard protocol without intravenous contrast. RADIATION DOSE REDUCTION: This exam was performed according to the departmental dose-optimization program which includes automated exposure control, adjustment of the mA and/or kV according to patient size and/or use of iterative reconstruction technique. COMPARISON:  CT thoracolumbar junction 01/13/2012 FINDINGS: Alignment: Normal alignment of the  thoracic spine. Vertebrae: Compression of the T12 vertebra demonstrates progression since previous study. Acute progression is not excluded. There is about 80% loss of height centrally. No other vertebral compression deformities. Vertebral hemangioma at T8. No destructive or expansile bone lesions. Paraspinal and other soft tissues: No abnormal paraspinal soft tissue mass or infiltration. Calcification of the aorta. Small esophageal hiatal hernia. Disc levels: Degenerative changes with disc space narrowing and endplate osteophyte formation throughout. IMPRESSION: 1. Normal alignment of the thoracic spine. Progression of compression fracture at T12 since previous study from 2013. Acute progression is not excluded. 2. Degenerative changes throughout the thoracic spine. 3. Aortic atherosclerosis.  Small esophageal hiatal hernia. Electronically Signed: By: Boyce Byes M.D. On: 01/05/2024 00:50   CT Cervical Spine Wo Contrast Result Date: 01/05/2024 CLINICAL DATA:  Neck trauma after a fall. EXAM: CT CERVICAL SPINE WITHOUT CONTRAST TECHNIQUE: Multidetector CT imaging of the cervical spine was performed without intravenous contrast. Multiplanar CT image reconstructions were also generated. RADIATION DOSE REDUCTION: This exam was performed according to the departmental dose-optimization program which includes automated exposure control, adjustment of the mA and/or kV according to patient size and/or use of iterative reconstruction technique. COMPARISON:  12/24/2013 FINDINGS: Alignment: Normal alignment of the cervical spine and facet joints. Skull base and vertebrae: Skull base appears intact. No vertebral compression deformities. No focal  bone lesion or bone destruction. Soft tissues and spinal canal: No prevertebral soft tissue swelling. No abnormal paraspinal soft tissue mass or infiltration. Disc levels: Degenerative changes throughout the cervical spine with disc space narrowing and endplate osteophyte  formation. Degenerative changes throughout the facet joints. Degenerative changes in the temporomandibular joints. Upper chest: Visualized lung apices are clear. Other: None. IMPRESSION: Normal alignment of the cervical vertebrae. No acute displaced fractures identified. Diffuse degenerative changes. Electronically Signed   By: Boyce Byes M.D.   On: 01/05/2024 00:56   CT Lumbar Spine Wo Contrast Result Date: 01/05/2024 CLINICAL DATA:  Back trauma due to a fall. EXAM: CT LUMBAR SPINE WITHOUT CONTRAST TECHNIQUE: Multidetector CT imaging of the lumbar spine was performed without intravenous contrast administration. Multiplanar CT image reconstructions were also generated. RADIATION DOSE REDUCTION: This exam was performed according to the departmental dose-optimization program which includes automated exposure control, adjustment of the mA and/or kV according to patient size and/or use of iterative reconstruction technique. COMPARISON:  Lumbar spine radiographs 01/17/2021. CT lumbar spine 01/13/2012 FINDINGS: Segmentation: 5 lumbar type vertebral bodies. Alignment: Normal alignment. Vertebrae: Chronic compression of the L2 vertebra post kyphoplasty. No significant progression. Mild retropulsion of fracture fragments at L2 is unchanged. No new compression deformities in the lumbar spine. Tarlov cysts in the sacrum. Paraspinal and other soft tissues: No abnormal paraspinal soft tissue mass or infiltration. Calcification of the aorta. Disc levels: Degenerative changes throughout with disc space narrowing and endplate osteophyte formation. Degenerative changes throughout the facet joints. IMPRESSION: 1. Normal alignment of the lumbar spine. Old compression of L2 post kyphoplasty is unchanged. No acute bony abnormalities. Electronically Signed   By: Boyce Byes M.D.   On: 01/05/2024 00:53   CT Head Wo Contrast Result Date: 01/05/2024 CLINICAL DATA:  Minor head trauma. Patient fell on Plavix . Vertigo and  dizziness this morning. EXAM: CT HEAD WITHOUT CONTRAST TECHNIQUE: Contiguous axial images were obtained from the base of the skull through the vertex without intravenous contrast. RADIATION DOSE REDUCTION: This exam was performed according to the departmental dose-optimization program which includes automated exposure control, adjustment of the mA and/or kV according to patient size and/or use of iterative reconstruction technique. COMPARISON:  MRI brain 01/21/2022.  CT head 01/17/2021 FINDINGS: Brain: Mild diffuse cerebral atrophy. Low-attenuation changes throughout the deep white matter most consistent with small vessel ischemic changes. No progression since prior study. No mass-effect or midline shift. No abnormal extra-axial fluid collections. Basal cisterns are not effaced. No acute intracranial hemorrhage. Vascular: No hyperdense vessel or unexpected calcification. Skull: Normal. Negative for fracture or focal lesion. Sinuses/Orbits: No acute finding. Other: None. IMPRESSION: No acute intracranial abnormalities. Chronic atrophy and small vessel ischemic changes similar to prior study. Electronically Signed   By: Boyce Byes M.D.   On: 01/05/2024 00:47   DG Ribs Bilateral W/Chest Result Date: 01/05/2024 CLINICAL DATA:  Rib pain. Patient fell. Vertigo with dizziness this morning. EXAM: BILATERAL RIBS AND CHEST - 4+ VIEW COMPARISON:  Chest radiograph 03/20/2018 FINDINGS: Heart size and pulmonary vascularity are normal. Lungs are clear. No pleural effusion or pneumothorax. Surgical clips over the right chest. Calcification of the aorta. Fracture of the right posterior fourth rib with callus formation suggesting a healing fracture. No acute displaced fractures demonstrated in the right or left ribs. Soft tissues are unremarkable. Degenerative changes in the spine and shoulders. IMPRESSION: 1. No evidence of active pulmonary disease. 2. Healing fracture in the right fourth rib. 3. No acute displaced rib  fractures identified  bilaterally. Electronically Signed   By: Boyce Byes M.D.   On: 01/05/2024 00:30    01/31/2024 PROCEDURE: US  Thyroid /Parathyroid, and/or Soft Tissue Neck  STATUS: Final  INTERPRETATION:  SIGNIFICANT FINDINGS US  Thyroid /Parathyroid, and/or Soft Tissue Neck: . Technique: A real-time thyroid  ultrasound was performed using grayscale and color Doppler imaging. Findings: Right Thyroid  Lobe: Measures 2.7 x 1.2 x 1.7 cm. Inhomogeneous echotexture. Tiny calcifications are seen. No hypervascularity noted. Isthmus: Measures 0.3 cm. No obvious lesions. Left Thyroid  Lobe: Measures 2.8 x 0.9 x 1.7 cm. Inhomogeneous echotexture. Tiny calcifications are seen. No hypervascularity noted. Increased vascularity present. A hypervascular mass measuring 3.0 x 2.6 x 2.4 cm is noted above the left thyroid  lobe and is the primary area of concern. Further evaluation with dedicated thyroid  imaging or biopsy may be indicated. Aaron Aas IMPRESSION: Bilateral thyroid  calcifications with heterogeneous echotexture as well as increased left lobe vascularity, findings could suggest thyroiditis. A hypervascular mass measuring 3.0 x 2.6 x 2.4 cm is noted above the left thyroid  lobe and is the primary area of concern. Further evaluation with dedicated thyroid  imaging or biopsy may be indicated. Assessment/Plan 1. Parotitis (Primary) Levofloxacin 750 mg orally every 24 hours plus metronidazole  500 mg orally every 8 hours  Encourage hydration Will follow up TSH, cbc with diff and BMP next lab day to monitor.    Jossilyn Benda K. Denney Fisherman [facility] [phone]

## 2024-02-01 NOTE — Progress Notes (Signed)
 Provider:   Location:  Other Nursing Home Room Number: [facility] 116A Place of Service:  SNF (31)  PCP: Rory Collard, MD Patient Care Team: Rory Collard, MD as PCP - General [facility] Medicine)  Extended Emergency Contact Information Primary Emergency Contact: Linnell Richardson Address: [address]          [place], [place] [address] Home Phone: [phone] Relation: Daughter  Code Status: DNR Goals of Care: Advanced Directive information    01/19/2024    8:00 PM  Advanced Directives  Does Patient Have a Medical Advance Directive? Yes  Type of Advance Directive Out of facility DNR (pink MOST or yellow form)  Does patient want to make changes to medical advance directive? No - Patient declined  Copy of Healthcare Power of Attorney in Chart? No - copy requested      Chief Complaint  Patient presents with   Admission   Medical Management of Chronic Issues    HPI: Patient is a 89 y.o. female seen today for admission to Discussed the use of AI scribe software for clinical note transcription with the patient, who gave verbal consent to proceed. History of Present Illness The patient presents with facial pain and swelling.  She woke up with pain in her face, neck, ears, and shoulders, accompanied by a large, red, swollen area underneath her left chin. She has been to the hospital multiple times recently due to these issues and was initially suspected to have had a stroke, but this was not confirmed. She has been hospitalized several times over the past few months.  She has a history of chronic compression fractures of T12 and L2 with four millimeter bony retropulsion and sequela, and noncompressive disease of L3 through L4. An MRI of the spine showed bone marrow swelling in the sacroiliac region. A CT scan of the abdomen showed no signs of mesenteric ischemia but revealed stenosis of the left renal artery, a moderate hiatal hernia, right pleural effusion, and borderline  cardiomegaly.  In March, a CT of her neck showed no emergent vessel occlusion or significant stenosis, but an MRI revealed an infarct in the cortex of the posterior left frontal lobe and meningiomas in the right frontal lobe.  She lives alone and uses a walker for mobility. She has a daughter-in-law nearby but feels she does not receive adequate support. She manages her medications herself, which include morphine , diazepam , and occasionally a scopolamine patch. She experiences some ear pain and chest discomfort, and her breathing is not much better despite being on oxygen. She has declined further hospital visits and surgeries, preferring to manage her condition at home.  Past Medical History:  Diagnosis Date   Anxiety    Breast cancer (HCC)    1990   Complication of anesthesia    Depression    Dyspnea    DOE   GERD (gastroesophageal reflux disease)    Headache(784.0)    HOH (hard of hearing)    Hypertension    Lymphoma (HCC)    Pain    CHRONIC BACK   Pain    CHRONIC BACK   Pneumonia    PONV (postoperative nausea and vomiting)    Tremors of nervous system    HANDS OCCAS   Past Surgical History:  Procedure Laterality Date   APPENDECTOMY     BACK SURGERY     BREAST SURGERY     CATARACT EXTRACTION W/PHACO Left 01/26/2017   Procedure: CATARACT EXTRACTION PHACO AND INTRAOCULAR LENS PLACEMENT (IOC);  Surgeon: Sammie Crigler  Porfilio, MD;  Location: [facility] ORS;  Service: Ophthalmology;  Laterality: Left;  US  53.7 AP% 20.3 CDE 10.90 Fluid pack lot # [id number]   CATARACT EXTRACTION W/PHACO Right 02/16/2017   Procedure: CATARACT EXTRACTION PHACO AND INTRAOCULAR LENS PLACEMENT (IOC);  Surgeon: Clair Crews, MD;  Location: [facility] ORS;  Service: Ophthalmology;  Laterality: Right;  US  00:55 AP% 22.5 CDE 12.43 Fluid pack lot # [id number]   CESAREAN SECTION     CHOLECYSTECTOMY N/A 03/20/2018   Procedure: LAPAROSCOPIC CHOLECYSTECTOMY;  Surgeon: Eldred Grego, MD;  Location: [facility] ORS;  Service:  General;  Laterality: N/A;   MASTECTOMY Right 1992   TUBAL LIGATION      reports that she has never smoked. She has never used smokeless tobacco. She reports current drug use. Drugs: Hydrocodone  and Benzodiazepines. She reports that she does not drink alcohol . Social History   Socioeconomic History   Marital status: Widowed    Spouse name: Not on file   Number of children: Not on file   Years of education: Not on file   Highest education level: Not on file  Occupational History   Not on file  Tobacco Use   Smoking status: Never   Smokeless tobacco: Never  Substance and Sexual Activity   Alcohol  use: No   Drug use: Yes    Types: Hydrocodone , Benzodiazepines   Sexual activity: Never  Other Topics Concern   Not on file  Social History Narrative   Not on file   Social Drivers of Health   Financial Resource Strain: Patient Declined (04/30/2023)   Received from [facility]   Overall Financial Resource Strain (CARDIA)    Difficulty of Paying Living Expenses: Patient declined  Food Insecurity: No Food Insecurity (01/19/2024)   Hunger Vital Sign    Worried About Running Out of Food in the Last Year: Never true    Ran Out of Food in the Last Year: Never true  Transportation Needs: No Transportation Needs (01/19/2024)   PRAPARE - Administrator, Civil Service (Medical): No    Lack of Transportation (Non-Medical): No  Physical Activity: Not on file  Stress: Not on file  Social Connections: Socially Isolated (01/19/2024)   Social Connection and Isolation Panel [NHANES]    Frequency of Communication with Friends and Family: Once a week    Frequency of Social Gatherings with Friends and Family: Once a week    Attends Religious Services: 1 to 4 times per year    Active Member of [employer] or Organizations: No    Attends Banker Meetings: Never    Marital Status: Widowed  Intimate Partner Violence: Not At Risk (01/19/2024)   Humiliation, Afraid, Rape,  and Kick questionnaire    Fear of Current or Ex-Partner: No    Emotionally Abused: No    Physically Abused: No    Sexually Abused: No    Functional Status Survey:    No family history on file.  Health Maintenance  Topic Date Due   Pneumonia Vaccine 62+ Years old (1 of 1 - PCV) 10/01/1985   Zoster Vaccines- Shingrix (1 of 2) 10/01/1985   DEXA SCAN  Never done   COVID-19 Vaccine (7 - 2024-25 season) 06/13/2023   Medicare Annual Wellness (AWV)  04/29/2024   INFLUENZA VACCINE  05/12/2024   DTaP/Tdap/Td (3 - Td or Tdap) 07/08/2025   HPV VACCINES  Aged Out   Meningococcal B Vaccine  Aged Out    Allergies  Allergen Reactions   Amoxicillin Swelling  Blisters.    Aspirin Other (See Comments)    Stomach ulcers.   Benadryl [Diphenhydramine Hcl] Other (See Comments)    Hyperactive.    Outpatient Encounter Medications as of 01/31/2024  Medication Sig   acetaminophen  (TYLENOL ) 325 MG tablet Take 2 tablets (650 mg total) by mouth every 6 (six) hours as needed for mild pain (pain score 1-3), fever or headache.   atorvastatin  (LIPITOR) 40 MG tablet Take 1 tablet (40 mg total) by mouth daily.   cholecalciferol (VITAMIN D) 1000 units tablet Take 2,000 Units by mouth daily.   clopidogrel  (PLAVIX ) 75 MG tablet Take 1 tablet (75 mg total) by mouth daily.   diazepam  (VALIUM ) 2 MG tablet Take 1 tablet (2 mg total) by mouth every 6 (six) hours as needed for anxiety.   docusate sodium  (COLACE) 100 MG capsule Take 100 mg by mouth daily as needed for mild constipation or moderate constipation.   fluticasone  (FLONASE ) 50 MCG/ACT nasal spray Place 2 sprays into both nostrils daily.   hydrochlorothiazide  (HYDRODIURIL ) 25 MG tablet Take 25 mg by mouth daily.   KLOR-CON  M20 20 MEQ tablet Take 20 mEq by mouth in the morning and at bedtime.   meclizine  (ANTIVERT ) 25 MG tablet Take 25 mg by mouth 3 (three) times daily as needed. For dizziness   polyethylene glycol (MIRALAX  / GLYCOLAX ) 17 g packet Take  17 g by mouth daily.   propranolol (INDERAL) 20 MG tablet Take 20 mg by mouth 2 (two) times daily.   scopolamine (TRANSDERM-SCOP) 1 MG/3DAYS Place 1 patch onto the skin every 3 (three) days. As needed (Patient not taking: Reported on 02/01/2024)   Sertraline  HCl 150 MG CAPS Take 150 mg by mouth 2 (two) times daily.   traZODone  (DESYREL ) 50 MG tablet Take 50 mg by mouth at bedtime.   vitamin B-12 (CYANOCOBALAMIN ) 500 MCG tablet Take 500 mcg by mouth daily.   No facility-administered encounter medications on file as of 01/31/2024.    Review of Systems  Vitals:   01/31/24 2010  BP: 139/77  Pulse: 85  Resp: 18  Temp: (!) 97.3 F (36.3 C)  SpO2: 97%  Weight: 183 lb (83 kg)   Body mass index is 27.83 kg/m. Physical Exam Physical Exam HEENT: Soft tissue swelling in the left posterior oropharynx. Ears not infected. Large, red, swollen area under left chin. CHEST: Lungs clear to auscultation bilaterally.  Labs reviewed: Basic Metabolic Panel: Recent Labs    01/22/24 0540 01/23/24 0421 01/24/24 0210 01/26/24 0442 01/31/24 0000  NA 136 137 135  --  133*  K 2.8* 4.3 4.0  --  3.6  CL 96* 98 95*  --  92*  CO2 32 31 32  --  33*  GLUCOSE 130* 116* 114*  --   --   BUN 21 21 21   --  25*  CREATININE 0.73 0.71 0.66 0.75 0.7  CALCIUM  7.7* 8.0* 8.1*  --  8.4*  MG 2.3  --   --   --   --    Liver Function Tests: Recent Labs    01/06/24 0436 01/19/24 0948 01/31/24 0000  AST 16 19 12*  ALT 14 13 18   ALKPHOS 47 131* 183*  BILITOT 1.0 1.0  --   PROT 5.7* 6.6  --   ALBUMIN 3.1* 3.5 3.5   No results for input(s): "LIPASE", "AMYLASE" in the last 8760 hours. No results for input(s): "AMMONIA" in the last 8760 hours. CBC: Recent Labs    01/22/24 0540 01/23/24  0421 01/24/24 0210 01/31/24 0000  WBC 6.7 5.3 4.8 11.8  NEUTROABS 5.0 3.2 3.1 10,231.00  HGB 12.8 12.5 12.3 12.8  HCT 37.9 38.6 37.9 39  MCV 92.7 96.3 95.0  --   PLT 243 223 223 202   Cardiac Enzymes: Recent Labs     01/05/24 0041  CKTOTAL 39   BNP: Invalid input(s): "POCBNP" Lab Results  Component Value Date   HGBA1C 5.3 01/05/2024   No results found for: "TSH" No results found for: "VITAMINB12" No results found for: "FOLATE" No results found for: "IRON", "TIBC", "FERRITIN" Results    Imaging and Procedures obtained prior to SNF admission: MR LUMBAR SPINE WO CONTRAST Result Date: 01/19/2024 CLINICAL DATA:  Initial evaluation for low back pain, trauma, multiple compression deformities. EXAM: MRI LUMBAR SPINE WITHOUT CONTRAST TECHNIQUE: Multiplanar, multisequence MR imaging of the lumbar spine was performed. No intravenous contrast was administered. COMPARISON:  Prior CT from 01/05/2024 as well as previous MRI from 01/01/2005. FINDINGS: Segmentation: Standard. Lowest well-formed disc space labeled the L5-S1 level. Alignment: Mild exaggeration of the normal lumbar lordosis with kyphotic angulation at the thoracolumbar junction. Underlying mild levoscoliosis. No significant listhesis. Vertebrae: Chronic compression fracture involving the T12 vertebral body with severe central height loss and 4 mm bony retropulsion. Additional chronic compression fracture of L2 with sequelae of prior vertebral augmentation. Up to 40% height loss with 4 mm bony retropulsion at this fracture. Otherwise, vertebral body height maintained with no other acute or recent fracture. There is question of abnormal marrow edema within the partially visualized right sacral ala on sagittal STIR sequence (series 6, image 3). Finding raises the possibility for an acute nondisplaced sacral fracture, although this is incompletely assessed on this exam. Underlying bone marrow signal intensity within normal limits. No worrisome osseous lesions. No other abnormal marrow edema. Conus medullaris and cauda equina: Conus extends to the L1 level. Conus and cauda equina appear normal. Few scattered Tarlov cyst noted posterior to the sacrum. Paraspinal and  other soft tissues: Unremarkable. Disc levels: T12-L1: 4 mm bony retropulsion related to the chronic T12 fracture. Underlying disc desiccation with mild disc bulge. Mild facet hypertrophy. No significant spinal stenosis. Foramina remain patent. L1-2: 4 mm bony retropulsion related to the chronic L2 fracture. Disc desiccation without significant disc bulge. Mild facet hypertrophy. No significant spinal stenosis. Foramina remain patent. L2-3: Disc desiccation with minimal disc bulge. Mild facet and ligament flavum hypertrophy. No significant spinal stenosis. Foramina remain patent. L3-4: Disc desiccation with mild disc bulge. Mild to moderate facet and ligament flavum hypertrophy. No significant spinal stenosis. Foramina remain patent. L4-5: Mild degenerative vertebral disc space narrowing with diffuse disc bulge and disc desiccation. Reactive endplate spurring. Mild bilateral facet hypertrophy. No significant spinal stenosis. Mild bilateral L4 foraminal narrowing. L5-S1: Disc desiccation without significant disc bulge. Moderate bilateral facet hypertrophy. No spinal stenosis. Foramina remain patent. IMPRESSION: 1. Question abnormal marrow edema within the partially visualized right sacral ala, raising the possibility for an acute/recent nondisplaced sacral fracture. Correlation with physical exam recommended. Additionally, follow-up examination with dedicated MRI of the sacrum could be performed for further evaluation as warranted. 2. No other acute abnormality within the lumbar spine. 3. Chronic compression fractures of T12 and L2 with 4 mm bony retropulsion, and sequelae of prior vertebral augmentation at L2. No significant spinal stenosis or neural impingement at these levels. 4. Mild noncompressive disc bulging with facet hypertrophy at L3-4 and L4-5 with resultant mild bilateral L4 foraminal stenosis. Electronically Signed   By: Amye Baller  Tresea Frost M.D.   On: 01/19/2024 20:03   CT Angio Abd/Pel W and/or Wo  Contrast Result Date: 01/19/2024 CLINICAL DATA:  Diffuse abdominal pain. Diffuse chest pain. Back pain. Shortness of breath. Clinical concerns include mesenteric ischemia. EXAM: CTA ABDOMEN AND PELVIS WITHOUT AND WITH CONTRAST TECHNIQUE: Multidetector CT imaging of the abdomen and pelvis was performed using the standard protocol during bolus administration of intravenous contrast. Multiplanar reconstructed images and MIPs were obtained and reviewed to evaluate the vascular anatomy. RADIATION DOSE REDUCTION: This exam was performed according to the departmental dose-optimization program which includes automated exposure control, adjustment of the mA and/or kV according to patient size and/or use of iterative reconstruction technique. CONTRAST:  OMNIPAQUE  IOHEXOL  350 MG/ML SOLN COMPARISON:  None Available. FINDINGS: VASCULAR Aorta: Atheromatous calcifications without aneurysm or dissection. Celiac: Patent without evidence of aneurysm, dissection, vasculitis or significant stenosis. No branch occlusions. SMA: Atheromatous calcifications at the origin without significant luminal narrowing. No branch occlusions. Renals: Calcified plaque at the origin of the left renal artery causing approximately 50% luminal narrowing. Anterior origin of the right renal artery without stenosis. IMA: Patent without evidence of aneurysm, dissection, vasculitis or significant stenosis. Inflow: Patent without evidence of aneurysm, dissection, vasculitis or significant stenosis. Proximal Outflow: Bilateral common femoral and visualized portions of the superficial and profunda femoral arteries are patent without evidence of aneurysm, dissection, vasculitis or significant stenosis. Veins: No obvious venous abnormality within the limitations of this arterial phase study. Review of the MIP images confirms the above findings. NON-VASCULAR Lower chest: Borderline enlarged heart. Mild right and minimal left dependent atelectasis. Small right  pleural effusion. Hepatobiliary: Small liver cysts. These do not need imaging follow-up. Cholecystectomy clips. Pancreas: Moderate diffuse pancreatic atrophy. Spleen: Normal in size without focal abnormality. Adrenals/Urinary Tract: Normal-appearing adrenal glands. Left renal parapelvic cysts. These do not need imaging follow-up. Unremarkable right kidney, ureters and urinary bladder. Stomach/Bowel: Moderate-sized hiatal hernia. Surgically absent appendix. Unremarkable small bowel and colon. Lymphatic: No enlarged lymph nodes. Reproductive: Uterus and bilateral adnexa are unremarkable. Other: No abdominal wall hernia or abnormality. No abdominopelvic ascites. Musculoskeletal: Lumbar and lower thoracic spine degenerative changes. Old L2 vertebral compression deformity with kyphoplasty material. Associated bony retropulsion causing moderate spinal stenosis. Old T12 compression deformity with associated bony retropulsion causing mild spinal stenosis. Moderate foraminal stenosis on the left at the T12-L1 level due to a combination of the bony retropulsion and facet degenerative changes. Multifactorial moderate spinal stenosis and moderate right and moderate-to-marked left foraminal stenosis at the L4-5 level. No acute fractures or subluxations. IMPRESSION: 1. No evidence of mesenteric ischemia. 2. Approximately 50% stenosis at the origin of the left renal artery. 3. Moderate-sized hiatal hernia. 4. Small right pleural effusion. 5. Borderline cardiomegaly. 6. Old T12 and L2 vertebral compression deformities with associated bony retropulsion causing mild spinal stenosis at T12 and moderate spinal stenosis at L2. 7. Moderate foraminal stenosis on the left at T12-L1. 8. Multifactorial moderate spinal stenosis and moderate right and moderate to marked left foraminal stenosis at L4-5. 9. Aortic atherosclerosis. Electronically Signed   By: Catherin Closs M.D.   On: 01/19/2024 13:37   DG Chest Portable 1 View Result Date:  01/19/2024 CLINICAL DATA:  Chest pain. EXAM: PORTABLE CHEST 1 VIEW COMPARISON:  January 05, 2024. FINDINGS: Stable cardiomediastinal silhouette. Both lungs are clear. The visualized skeletal structures are unremarkable. IMPRESSION: No active disease. Electronically Signed   By: Rosalene Colon M.D.   On: 01/19/2024 11:59    Results RADIOLOGY Lumbar spine  MRI: Bone marrow edema in the sacroiliac. Chronic compression fractures of T12 and L2 with 4 mm bony retropulsion and sequelae. Noncompressive disease of L3 through L4. CT abdomen: No signs of mesenteric ischemia. Left renal artery stenosis. Moderate hiatal hernia. Right pleural effusion. Borderline cardiomegaly. CT neck: No emergent vessel occlusion or proximal hemodynamically significant stenosis. Consider acute infarct and meningiomas better characterized on recent MRI. (12/2023) MRI brain: Infarct of the cortex of the posterior left frontal lobe. Similar appearance of meningiomas of the right frontal lobe in anterior falx. (01/05/2024)  Assessment/Plan Facial and neck swelling Acute large, red, swollen area underneath the left chin extending to the neck, ears, and shoulders. Concern for abscess or vascular issue given proximity to airway. She declines hospital transfer and surgery, preferring management at the current facility. Ultrasound planned to assess the nature of the swelling. If an abscess or vascular issue is identified, surgical intervention may be necessary, but she has declined surgery. - Order ultrasound of the neck to assess for abscess or vascular issue - Consider antibiotics if infection is suspected  Falls Recent fall with no fractures identified. Multiple hospitalizations. Uses a walker and lives alone. Medications, including morphine  and diazepam , may contribute to fall risk. She acknowledges the risk but prefers to manage medications independently. - Review and monitor medications that may increase fall risk - Dose reduction  of medications as patient tolerates and agrees.   Cerebral infarct MRI on January 05, 2024, showed infarct of the cortex of the posterior left frontal lobe. No emergent vessel occlusion or significant stenosis on CT of the neck. Meningioma seen on imaging unchanged and stable.   Chronic compression fractures - unknown DOI Chronic compression fractures of T12 and L2 with 4 mm bony retropulsion. Noncompressive disease of L3 through L4. Bone marrow swelling in the sacroiliac region noted on MRI.  Abdominal pain CT abdomen showed no signs of mesenteric ischemia. Left renal artery stenosis and moderate hiatal hernia noted. Right pleural effusion and borderline cardiomegaly present.  Advance Care Planning   Goals of Care She has expressed a desire not to return to the hospital or undergo surgery. She has been hospitalized multiple times in the last few months and does not desire invasive interventions. Prefers comfort-based care at the current facility. DNR status confirmed. She and her family agree on managing care at the current facility, acknowledging the limitations and risks of not pursuing hospital-based interventions. - Provide comfort-based medications as needed - Discuss and document goals of care with family     Family/ staff Communication: Daughter, patient, nursing  Labs/tests ordered: CBC, BMP, neck Ultrasound  I spent greater than 45  minutes for the care of this patient in face to face time, chart review, clinical documentation, patient education. I spent an additional 16 minutes discussing goals of care and advanced care planning.

## 2024-02-04 ENCOUNTER — Encounter: Payer: Self-pay | Admitting: Student

## 2024-02-06 ENCOUNTER — Encounter: Payer: Self-pay | Admitting: Student

## 2024-02-07 NOTE — Telephone Encounter (Signed)
 Message routed to Dr.Beamer. Are you taking over care for this patient now?

## 2024-02-07 NOTE — Telephone Encounter (Signed)
Message routed to Dr.Beamer. 

## 2024-02-09 ENCOUNTER — Non-Acute Institutional Stay (SKILLED_NURSING_FACILITY): Payer: Self-pay | Admitting: Student

## 2024-02-09 ENCOUNTER — Encounter: Payer: Self-pay | Admitting: Student

## 2024-02-09 DIAGNOSIS — K1120 Sialoadenitis, unspecified: Secondary | ICD-10-CM | POA: Diagnosis not present

## 2024-02-09 DIAGNOSIS — S3282XS Multiple fractures of pelvis without disruption of pelvic ring, sequela: Secondary | ICD-10-CM | POA: Diagnosis not present

## 2024-02-09 MED ORDER — TRAMADOL HCL 50 MG PO TABS: 50.0000 mg | ORAL_TABLET | Freq: Four times a day (QID) | ORAL | 0 refills | Status: AC | PRN

## 2024-02-09 NOTE — Progress Notes (Signed)
 Location:  Other [facility] [place].  Nursing Home Room Number: [facility] [address] Place of Service:  SNF [phone]) Provider:  Dr. Valrie Gehrig  PCP: Rory Collard, MD  Patient Care Team: Rory Collard, MD as PCP - General [facility] Medicine)  Extended Emergency Contact Information Primary Emergency Contact: Linnell Richardson Address: [address]          [place], [place] [address] Home Phone: [phone] Relation: Daughter  Code Status:  DNR Goals of care: Advanced Directive information    01/19/2024    8:00 PM  Advanced Directives  Does Patient Have a Medical Advance Directive? Yes  Type of Advance Directive Out of facility DNR (pink MOST or yellow form)  Does patient want to make changes to medical advance directive? No - Patient declined  Copy of Healthcare Power of Attorney in Chart? No - copy requested     Chief Complaint  Patient presents with   Pain Management    Pain Management     HPI:  Pt is a 89 y.o. female seen today for an acute visit for Pain Management  History of Present Illness The patient presents with back and neck pain following a recent fall.  She experiences constant and severe pain in her back and neck, which began after a recent fall. The pain significantly affects her ability to sleep and move comfortably. Pain medication has not been effective in alleviating her symptoms.  The neck pain is exacerbated by eating and swallowing, making it difficult for her to consume food. The pain is located in her neck and radiates to her lower back.  The back pain is described as being higher up, across her back, and is constant, causing her to frequently change positions in an attempt to find relief. No radiation of pain down her legs.  She has tried various treatments including heating and cold patches, but these have not provided relief and sometimes worsen the pain.  Her medication history includes the use of hydrocodone  for a previous back injury, and she mentions  having taken morphine  in the past. Currently, she is not on any effective pain management regimen.  She has a history of a recent stroke and a previous back injury for which she was prescribed hydrocodone  in the past.  Past Medical History:  Diagnosis Date   Anxiety    Breast cancer (HCC)    1990   Complication of anesthesia    Depression    Dyspnea    DOE   GERD (gastroesophageal reflux disease)    Headache(784.0)    HOH (hard of hearing)    Hypertension    Lymphoma (HCC)    Pain    CHRONIC BACK   Pain    CHRONIC BACK   Pneumonia    PONV (postoperative nausea and vomiting)    Tremors of nervous system    HANDS OCCAS   Past Surgical History:  Procedure Laterality Date   APPENDECTOMY     BACK SURGERY     BREAST SURGERY     CATARACT EXTRACTION W/PHACO Left 01/26/2017   Procedure: CATARACT EXTRACTION PHACO AND INTRAOCULAR LENS PLACEMENT (IOC);  Surgeon: Clair Crews, MD;  Location: [facility] ORS;  Service: Ophthalmology;  Laterality: Left;  US  53.7 AP% 20.3 CDE 10.90 Fluid pack lot # [id number]   CATARACT EXTRACTION W/PHACO Right 02/16/2017   Procedure: CATARACT EXTRACTION PHACO AND INTRAOCULAR LENS PLACEMENT (IOC);  Surgeon: Clair Crews, MD;  Location: [facility] ORS;  Service: Ophthalmology;  Laterality: Right;  US  00:55 AP% 22.5  CDE 12.43 Fluid pack lot # [id number]   CESAREAN SECTION     CHOLECYSTECTOMY N/A 03/20/2018   Procedure: LAPAROSCOPIC CHOLECYSTECTOMY;  Surgeon: Eldred Grego, MD;  Location: [facility] ORS;  Service: General;  Laterality: N/A;   MASTECTOMY Right 1992   TUBAL LIGATION      Allergies  Allergen Reactions   Amoxicillin Swelling    Blisters.    Aspirin Other (See Comments)    Stomach ulcers.   Benadryl [Diphenhydramine Hcl] Other (See Comments)    Hyperactive.    Outpatient Encounter Medications as of 02/09/2024  Medication Sig   acetaminophen  (TYLENOL ) 325 MG tablet Take 2 tablets (650 mg total) by mouth every 6 (six) hours as needed for mild  pain (pain score 1-3), fever or headache.   atorvastatin  (LIPITOR) 40 MG tablet Take 1 tablet (40 mg total) by mouth daily.   cholecalciferol (VITAMIN D) 1000 units tablet Take 2,000 Units by mouth daily.   clopidogrel  (PLAVIX ) 75 MG tablet Take 1 tablet (75 mg total) by mouth daily.   diazepam  (VALIUM ) 2 MG tablet Take 1 tablet (2 mg total) by mouth every 6 (six) hours as needed for anxiety.   docusate sodium  (COLACE) 100 MG capsule Take 100 mg by mouth daily as needed for mild constipation or moderate constipation.   fluticasone  (FLONASE ) 50 MCG/ACT nasal spray Place 2 sprays into both nostrils daily.   hydrochlorothiazide  (HYDRODIURIL ) 25 MG tablet Take 25 mg by mouth daily.   KLOR-CON  M20 20 MEQ tablet Take 20 mEq by mouth in the morning and at bedtime.   levofloxacin (LEVAQUIN) 500 MG tablet Take 500 mg by mouth daily.   meclizine  (ANTIVERT ) 25 MG tablet Take 25 mg by mouth 3 (three) times daily as needed. For dizziness   metroNIDAZOLE  (FLAGYL ) 500 MG tablet Take 500 mg by mouth 3 (three) times daily.   OXYGEN 2lpm every shift   polyethylene glycol (MIRALAX  / GLYCOLAX ) 17 g packet Take 17 g by mouth daily.   propranolol (INDERAL) 20 MG tablet Take 20 mg by mouth 2 (two) times daily.   Sertraline  HCl 150 MG CAPS Take 150 mg by mouth 2 (two) times daily.   traZODone  (DESYREL ) 50 MG tablet Take 50 mg by mouth at bedtime.   vitamin B-12 (CYANOCOBALAMIN ) 500 MCG tablet Take 500 mcg by mouth daily.   scopolamine (TRANSDERM-SCOP) 1 MG/3DAYS Place 1 patch onto the skin every 3 (three) days. As needed (Patient not taking: Reported on 02/01/2024)   No facility-administered encounter medications on file as of 02/09/2024.    Review of Systems  Immunization History  Administered Date(s) Administered   Influenza, High Dose Seasonal PF 07/14/2017, 07/26/2023   PFIZER Comirnaty(Gray Top)Covid-19 Tri-Sucrose Vaccine 10/19/2019, 11/09/2019, 10/17/2020, 04/16/2021   PFIZER(Purple Top)SARS-COV-2  Vaccination 10/19/2019, 11/09/2019   Pneumococcal-Unspecified 04/10/2000   Rsv, Bivalent, Protein Subunit Rsvpref,pf Pattricia Bores) 08/02/2023   Tdap 03/29/2014, 07/09/2015   Zoster, Unspecified 04/16/2021   Pertinent  Health Maintenance Due  Topic Date Due   DEXA SCAN  Never done   INFLUENZA VACCINE  05/12/2024      01/17/2021    3:00 PM 01/17/2021   10:00 PM 01/18/2021   12:00 PM 01/18/2021    8:30 PM 01/19/2021    9:00 AM  Fall Risk  (RETIRED) Patient Fall Risk Level High fall risk High fall risk High fall risk High fall risk High fall risk   Functional Status Survey:    Vitals:   02/09/24 0850  BP: 137/84  Pulse: 91  Resp: Aaron Aas)  22  Temp: 98.8 F (37.1 C)  SpO2: 95%  Weight: 183 lb (83 kg)  Height: 5\' 8"  (1.727 m)   Body mass index is 27.83 kg/m. Physical Exam Constitutional:      Appearance: Normal appearance.  Cardiovascular:     Rate and Rhythm: Normal rate.  Musculoskeletal:     Comments: Left lower back TTP no spinal point tenderness.   Neurological:     Mental Status: She is alert.     Labs reviewed: Recent Labs    01/22/24 0540 01/23/24 0421 01/24/24 0210 01/26/24 0442 01/31/24 0000  NA 136 137 135  --  133*  K 2.8* 4.3 4.0  --  3.6  CL 96* 98 95*  --  92*  CO2 32 31 32  --  33*  GLUCOSE 130* 116* 114*  --   --   BUN 21 21 21   --  25*  CREATININE 0.73 0.71 0.66 0.75 0.7  CALCIUM  7.7* 8.0* 8.1*  --  8.4*  MG 2.3  --   --   --   --    Recent Labs    01/06/24 0436 01/19/24 0948 01/31/24 0000  AST 16 19 12*  ALT 14 13 18   ALKPHOS 47 131* 183*  BILITOT 1.0 1.0  --   PROT 5.7* 6.6  --   ALBUMIN 3.1* 3.5 3.5   Recent Labs    01/22/24 0540 01/23/24 0421 01/24/24 0210 01/31/24 0000  WBC 6.7 5.3 4.8 11.8  NEUTROABS 5.0 3.2 3.1 10,231.00  HGB 12.8 12.5 12.3 12.8  HCT 37.9 38.6 37.9 39  MCV 92.7 96.3 95.0  --   PLT 243 223 223 202   No results found for: "TSH" Lab Results  Component Value Date   HGBA1C 5.3 01/05/2024   Lab Results   Component Value Date   CHOL 146 01/06/2024   HDL 42 01/06/2024   LDLCALC 86 01/06/2024   TRIG 89 01/06/2024   CHOLHDL 3.5 01/06/2024    Significant Diagnostic Results in last 30 days:  MR LUMBAR SPINE WO CONTRAST Result Date: 01/19/2024 CLINICAL DATA:  Initial evaluation for low back pain, trauma, multiple compression deformities. EXAM: MRI LUMBAR SPINE WITHOUT CONTRAST TECHNIQUE: Multiplanar, multisequence MR imaging of the lumbar spine was performed. No intravenous contrast was administered. COMPARISON:  Prior CT from 01/05/2024 as well as previous MRI from 01/01/2005. FINDINGS: Segmentation: Standard. Lowest well-formed disc space labeled the L5-S1 level. Alignment: Mild exaggeration of the normal lumbar lordosis with kyphotic angulation at the thoracolumbar junction. Underlying mild levoscoliosis. No significant listhesis. Vertebrae: Chronic compression fracture involving the T12 vertebral body with severe central height loss and 4 mm bony retropulsion. Additional chronic compression fracture of L2 with sequelae of prior vertebral augmentation. Up to 40% height loss with 4 mm bony retropulsion at this fracture. Otherwise, vertebral body height maintained with no other acute or recent fracture. There is question of abnormal marrow edema within the partially visualized right sacral ala on sagittal STIR sequence (series 6, image 3). Finding raises the possibility for an acute nondisplaced sacral fracture, although this is incompletely assessed on this exam. Underlying bone marrow signal intensity within normal limits. No worrisome osseous lesions. No other abnormal marrow edema. Conus medullaris and cauda equina: Conus extends to the L1 level. Conus and cauda equina appear normal. Few scattered Tarlov cyst noted posterior to the sacrum. Paraspinal and other soft tissues: Unremarkable. Disc levels: T12-L1: 4 mm bony retropulsion related to the chronic T12 fracture. Underlying disc desiccation with  mild  disc bulge. Mild facet hypertrophy. No significant spinal stenosis. Foramina remain patent. L1-2: 4 mm bony retropulsion related to the chronic L2 fracture. Disc desiccation without significant disc bulge. Mild facet hypertrophy. No significant spinal stenosis. Foramina remain patent. L2-3: Disc desiccation with minimal disc bulge. Mild facet and ligament flavum hypertrophy. No significant spinal stenosis. Foramina remain patent. L3-4: Disc desiccation with mild disc bulge. Mild to moderate facet and ligament flavum hypertrophy. No significant spinal stenosis. Foramina remain patent. L4-5: Mild degenerative vertebral disc space narrowing with diffuse disc bulge and disc desiccation. Reactive endplate spurring. Mild bilateral facet hypertrophy. No significant spinal stenosis. Mild bilateral L4 foraminal narrowing. L5-S1: Disc desiccation without significant disc bulge. Moderate bilateral facet hypertrophy. No spinal stenosis. Foramina remain patent. IMPRESSION: 1. Question abnormal marrow edema within the partially visualized right sacral ala, raising the possibility for an acute/recent nondisplaced sacral fracture. Correlation with physical exam recommended. Additionally, follow-up examination with dedicated MRI of the sacrum could be performed for further evaluation as warranted. 2. No other acute abnormality within the lumbar spine. 3. Chronic compression fractures of T12 and L2 with 4 mm bony retropulsion, and sequelae of prior vertebral augmentation at L2. No significant spinal stenosis or neural impingement at these levels. 4. Mild noncompressive disc bulging with facet hypertrophy at L3-4 and L4-5 with resultant mild bilateral L4 foraminal stenosis. Electronically Signed   By: Virgia Griffins M.D.   On: 01/19/2024 20:03   CT Angio Abd/Pel W and/or Wo Contrast Result Date: 01/19/2024 CLINICAL DATA:  Diffuse abdominal pain. Diffuse chest pain. Back pain. Shortness of breath. Clinical concerns include  mesenteric ischemia. EXAM: CTA ABDOMEN AND PELVIS WITHOUT AND WITH CONTRAST TECHNIQUE: Multidetector CT imaging of the abdomen and pelvis was performed using the standard protocol during bolus administration of intravenous contrast. Multiplanar reconstructed images and MIPs were obtained and reviewed to evaluate the vascular anatomy. RADIATION DOSE REDUCTION: This exam was performed according to the departmental dose-optimization program which includes automated exposure control, adjustment of the mA and/or kV according to patient size and/or use of iterative reconstruction technique. CONTRAST:  OMNIPAQUE  IOHEXOL  350 MG/ML SOLN COMPARISON:  None Available. FINDINGS: VASCULAR Aorta: Atheromatous calcifications without aneurysm or dissection. Celiac: Patent without evidence of aneurysm, dissection, vasculitis or significant stenosis. No branch occlusions. SMA: Atheromatous calcifications at the origin without significant luminal narrowing. No branch occlusions. Renals: Calcified plaque at the origin of the left renal artery causing approximately 50% luminal narrowing. Anterior origin of the right renal artery without stenosis. IMA: Patent without evidence of aneurysm, dissection, vasculitis or significant stenosis. Inflow: Patent without evidence of aneurysm, dissection, vasculitis or significant stenosis. Proximal Outflow: Bilateral common femoral and visualized portions of the superficial and profunda femoral arteries are patent without evidence of aneurysm, dissection, vasculitis or significant stenosis. Veins: No obvious venous abnormality within the limitations of this arterial phase study. Review of the MIP images confirms the above findings. NON-VASCULAR Lower chest: Borderline enlarged heart. Mild right and minimal left dependent atelectasis. Small right pleural effusion. Hepatobiliary: Small liver cysts. These do not need imaging follow-up. Cholecystectomy clips. Pancreas: Moderate diffuse pancreatic  atrophy. Spleen: Normal in size without focal abnormality. Adrenals/Urinary Tract: Normal-appearing adrenal glands. Left renal parapelvic cysts. These do not need imaging follow-up. Unremarkable right kidney, ureters and urinary bladder. Stomach/Bowel: Moderate-sized hiatal hernia. Surgically absent appendix. Unremarkable small bowel and colon. Lymphatic: No enlarged lymph nodes. Reproductive: Uterus and bilateral adnexa are unremarkable. Other: No abdominal wall hernia or abnormality. No abdominopelvic ascites. Musculoskeletal: Lumbar  and lower thoracic spine degenerative changes. Old L2 vertebral compression deformity with kyphoplasty material. Associated bony retropulsion causing moderate spinal stenosis. Old T12 compression deformity with associated bony retropulsion causing mild spinal stenosis. Moderate foraminal stenosis on the left at the T12-L1 level due to a combination of the bony retropulsion and facet degenerative changes. Multifactorial moderate spinal stenosis and moderate right and moderate-to-marked left foraminal stenosis at the L4-5 level. No acute fractures or subluxations. IMPRESSION: 1. No evidence of mesenteric ischemia. 2. Approximately 50% stenosis at the origin of the left renal artery. 3. Moderate-sized hiatal hernia. 4. Small right pleural effusion. 5. Borderline cardiomegaly. 6. Old T12 and L2 vertebral compression deformities with associated bony retropulsion causing mild spinal stenosis at T12 and moderate spinal stenosis at L2. 7. Moderate foraminal stenosis on the left at T12-L1. 8. Multifactorial moderate spinal stenosis and moderate right and moderate to marked left foraminal stenosis at L4-5. 9. Aortic atherosclerosis. Electronically Signed   By: Catherin Closs M.D.   On: 01/19/2024 13:37   DG Chest Portable 1 View Result Date: 01/19/2024 CLINICAL DATA:  Chest pain. EXAM: PORTABLE CHEST 1 VIEW COMPARISON:  January 05, 2024. FINDINGS: Stable cardiomediastinal silhouette. Both lungs  are clear. The visualized skeletal structures are unremarkable. IMPRESSION: No active disease. Electronically Signed   By: Rosalene Colon M.D.   On: 01/19/2024 11:59  Results RADIOLOGY Lumbar spine MRI: Chronic compression fractures of T12 and L2, noncompressive bulging disc with facet hypertrophy at L3 and L4  Assessment/Plan Chronic back pain with compression fractures Chronic back pain with compression fractures at T12 and L2, accompanied by noncompressive bulging disc with facet hypertrophy at L3 and L4. Pain is persistent, worsens with movement, and is likely musculoskeletal, possibly exacerbated by the new bed. No radiation to legs. Previous use of hydrocodone  and morphine  for pain management. - Restart tramadol  50 mg every 6 hours as needed for pain.  Bruised hip bone marrow Bruised hip bone marrow identified on imaging, likely contributing to pain symptoms.  Parotitis Neck pain exacerbated by swallowing, improving. Reports difficulty swallowing and increased pain with eating. - SLP to continue working with patient  Family/ staff Communication: nuring  Labs/tests ordered:  none

## 2024-02-17 DIAGNOSIS — E039 Hypothyroidism, unspecified: Secondary | ICD-10-CM | POA: Diagnosis not present

## 2024-02-17 DIAGNOSIS — I10 Essential (primary) hypertension: Secondary | ICD-10-CM | POA: Diagnosis not present

## 2024-02-17 LAB — COMPREHENSIVE METABOLIC PANEL WITH GFR
Calcium: 8.6 — AB (ref 8.7–10.7)
eGFR: 85

## 2024-02-17 LAB — CBC AND DIFFERENTIAL
HCT: 41 (ref 36–46)
Hemoglobin: 13.1 (ref 12.0–16.0)
Neutrophils Absolute: 6642
Platelets: 195 10*3/uL (ref 150–400)
WBC: 8.2

## 2024-02-17 LAB — BASIC METABOLIC PANEL WITH GFR
BUN: 15 (ref 4–21)
CO2: 28 — AB (ref 13–22)
Chloride: 97 — AB (ref 99–108)
Creatinine: 0.6 (ref 0.5–1.1)
Glucose: 76
Potassium: 4.2 meq/L (ref 3.5–5.1)
Sodium: 137 (ref 137–147)

## 2024-02-17 LAB — TSH: TSH: 1.6 (ref 0.41–5.90)

## 2024-02-17 LAB — CBC: RBC: 4.36 (ref 3.87–5.11)

## 2024-02-22 ENCOUNTER — Other Ambulatory Visit: Payer: Self-pay | Admitting: Nurse Practitioner

## 2024-02-22 NOTE — Telephone Encounter (Signed)
 Patient is a SNF patient at [facility].  Message sent to Valrie Gehrig, MD

## 2024-02-28 ENCOUNTER — Encounter: Payer: Self-pay | Admitting: Nurse Practitioner

## 2024-02-29 ENCOUNTER — Encounter: Payer: Self-pay | Admitting: Nurse Practitioner

## 2024-02-29 ENCOUNTER — Non-Acute Institutional Stay (SKILLED_NURSING_FACILITY): Payer: Self-pay | Admitting: Nurse Practitioner

## 2024-02-29 DIAGNOSIS — M545 Low back pain, unspecified: Secondary | ICD-10-CM

## 2024-02-29 DIAGNOSIS — R1310 Dysphagia, unspecified: Secondary | ICD-10-CM

## 2024-02-29 DIAGNOSIS — I10 Essential (primary) hypertension: Secondary | ICD-10-CM

## 2024-02-29 DIAGNOSIS — I6930 Unspecified sequelae of cerebral infarction: Secondary | ICD-10-CM | POA: Diagnosis not present

## 2024-02-29 DIAGNOSIS — F419 Anxiety disorder, unspecified: Secondary | ICD-10-CM | POA: Diagnosis not present

## 2024-02-29 DIAGNOSIS — G8929 Other chronic pain: Secondary | ICD-10-CM | POA: Diagnosis not present

## 2024-02-29 MED ORDER — ESCITALOPRAM OXALATE 20 MG PO TABS: 20.0000 mg | ORAL_TABLET | Freq: Every day | ORAL | Status: DC

## 2024-02-29 MED ORDER — DIAZEPAM 2 MG PO TABS: ORAL_TABLET | ORAL | 0 refills | Status: DC

## 2024-02-29 NOTE — Progress Notes (Signed)
 Location:  Other [facility].  Nursing Home Room Number: [facility] 116A Place of Service:  SNF ([phone]) Gilbert Lab, NP  PCP: Rory Collard, MD  Patient Care Team: Rory Collard, MD as PCP - General [facility] Medicine)  Extended Emergency Contact Information Primary Emergency Contact: [facility] Address: [address]          [place], [place] [address] Home Phone: [phone] Relation: Daughter  Goals of care: Advanced Directive information    02/29/2024    9:42 AM  Advanced Directives  Does Patient Have a Medical Advance Directive? Yes  Type of Advance Directive Out of facility DNR (pink MOST or yellow form)  Does patient want to make changes to medical advance directive? No - Patient declined     Chief Complaint  Patient presents with   Medical Management of Chronic Issues    Medical Management of Chronic Issues.     HPI:  Pt is a 89 y.o. female seen today for medical management of chronic disease.  She has a hx with a history of stroke, presents with new onset headaches and increased anxiety.  She experiences new headaches primarily at night, described as a throbbing sensation around her ears and sometimes extending to her neck. The headaches are not consistently triggered by lying down, but she notes increased sensitivity to light, particularly from a harsh light near her bed, which exacerbates her symptoms. Covering the light with a towel has provided some relief. She associates the onset of these headaches with her recent stroke, which also resulted in double vision and soreness in one eye.  She has been experiencing increased anxiety and has been taking diazepam  (Valium ) every six hours, a significant increase from her previous usage, which was limited to occasional doses before doctor visits. She feels nervous and shaky, and wants to be more active and not confined to sitting. Sertraline , taken twice daily, ineffective in managing anxiety symptoms.  She  experiences chronic low back pain, which is described as being across her lower back but not radiating down her legs. She finds some relief with Tylenol .  She reports difficulty swallowing, which has led to dietary modifications including soft foods. She is receiving therapy to address this issue.  She has difficulty sleeping, often staying awake through the night, and reports limited mobility due to fall risk, requiring assistance from aides to leave her room. She is currently staying in a care facility and is uncertain about the duration of her stay.   No issues with bowel movements and no shortness of breath, although she is currently on oxygen in the facility.   Past Medical History:  Diagnosis Date   Anxiety    Breast cancer (HCC)    1990   Complication of anesthesia    Depression    Dyspnea    DOE   GERD (gastroesophageal reflux disease)    Headache(784.0)    HOH (hard of hearing)    Hypertension    Lymphoma (HCC)    Pain    CHRONIC BACK   Pain    CHRONIC BACK   Pneumonia    PONV (postoperative nausea and vomiting)    Tremors of nervous system    HANDS OCCAS   Past Surgical History:  Procedure Laterality Date   APPENDECTOMY     BACK SURGERY     BREAST SURGERY     CATARACT EXTRACTION W/PHACO Left 01/26/2017   Procedure: CATARACT EXTRACTION PHACO AND INTRAOCULAR LENS PLACEMENT (IOC);  Surgeon: Clair Crews, MD;  Location: [facility] ORS;  Service: Ophthalmology;  Laterality: Left;  US  53.7 AP% 20.3 CDE 10.90 Fluid pack lot # [id number]   CATARACT EXTRACTION W/PHACO Right 02/16/2017   Procedure: CATARACT EXTRACTION PHACO AND INTRAOCULAR LENS PLACEMENT (IOC);  Surgeon: Clair Crews, MD;  Location: [facility] ORS;  Service: Ophthalmology;  Laterality: Right;  US  00:55 AP% 22.5 CDE 12.43 Fluid pack lot # [id number]   CESAREAN SECTION     CHOLECYSTECTOMY N/A 03/20/2018   Procedure: LAPAROSCOPIC CHOLECYSTECTOMY;  Surgeon: Eldred Grego, MD;  Location: [facility] ORS;  Service:  General;  Laterality: N/A;   MASTECTOMY Right 1992   TUBAL LIGATION      Allergies  Allergen Reactions   Amoxicillin Swelling    Blisters.    Aspirin Other (See Comments)    Stomach ulcers.   Benadryl [Diphenhydramine Hcl] Other (See Comments)    Hyperactive.    Outpatient Encounter Medications as of 02/29/2024  Medication Sig   acetaminophen  (TYLENOL ) 325 MG tablet Take 2 tablets (650 mg total) by mouth every 6 (six) hours as needed for mild pain (pain score 1-3), fever or headache.   atorvastatin  (LIPITOR) 40 MG tablet Take 1 tablet (40 mg total) by mouth daily.   cholecalciferol (VITAMIN D) 1000 units tablet Take 2,000 Units by mouth daily.   clopidogrel  (PLAVIX ) 75 MG tablet Take 1 tablet (75 mg total) by mouth daily.   diazepam  (VALIUM ) 2 MG tablet TAKE 1 TABLET BY MOUTH EVERY 6 HOURS AS NEEDED FOR ANXIETY   docusate sodium  (COLACE) 100 MG capsule Take 100 mg by mouth daily as needed for mild constipation or moderate constipation.   fluticasone  (FLONASE ) 50 MCG/ACT nasal spray Place 2 sprays into both nostrils daily.   hydrochlorothiazide  (HYDRODIURIL ) 25 MG tablet Take 25 mg by mouth daily.   KLOR-CON  M20 20 MEQ tablet Take 20 mEq by mouth in the morning and at bedtime.   meclizine  (ANTIVERT ) 25 MG tablet Take 25 mg by mouth 3 (three) times daily as needed. For dizziness   OXYGEN 2lpm every shift   polyethylene glycol (MIRALAX  / GLYCOLAX ) 17 g packet Take 17 g by mouth daily.   propranolol (INDERAL) 20 MG tablet Take 20 mg by mouth 2 (two) times daily.   Sertraline  HCl 150 MG CAPS Take 150 mg by mouth 2 (two) times daily.   vitamin B-12 (CYANOCOBALAMIN ) 500 MCG tablet Take 500 mcg by mouth daily.   levofloxacin (LEVAQUIN) 500 MG tablet Take 500 mg by mouth daily. (Patient not taking: Reported on 02/29/2024)   metroNIDAZOLE  (FLAGYL ) 500 MG tablet Take 500 mg by mouth 3 (three) times daily. (Patient not taking: Reported on 02/29/2024)   scopolamine (TRANSDERM-SCOP) 1 MG/3DAYS  Place 1 patch onto the skin every 3 (three) days. As needed (Patient not taking: Reported on 02/01/2024)   traZODone  (DESYREL ) 50 MG tablet Take 50 mg by mouth at bedtime. (Patient not taking: Reported on 02/29/2024)   No facility-administered encounter medications on file as of 02/29/2024.    Review of Systems  Constitutional:  Negative for activity change, appetite change, fatigue and unexpected weight change.  HENT:  Negative for congestion and hearing loss.   Eyes: Negative.   Respiratory:  Negative for cough and shortness of breath.   Cardiovascular:  Negative for chest pain, palpitations and leg swelling.  Gastrointestinal:  Negative for abdominal pain, constipation and diarrhea.  Genitourinary:  Negative for difficulty urinating and dysuria.  Musculoskeletal:  Positive for arthralgias and neck pain. Negative for myalgias.  Skin:  Negative for  color change and wound.  Neurological:  Positive for headaches. Negative for dizziness and weakness.  Psychiatric/Behavioral:  Negative for agitation, behavioral problems and confusion. The patient is nervous/anxious.      Immunization History  Administered Date(s) Administered   Influenza, High Dose Seasonal PF 07/14/2017, 07/26/2023   PFIZER Comirnaty(Gray Top)Covid-19 Tri-Sucrose Vaccine 10/19/2019, 11/09/2019, 10/17/2020, 04/16/2021   PFIZER(Purple Top)SARS-COV-2 Vaccination 10/19/2019, 11/09/2019   Pneumococcal-Unspecified 04/10/2000   Rsv, Bivalent, Protein Subunit Rsvpref,pf Pattricia Bores) 08/02/2023   Tdap 03/29/2014, 07/09/2015   Zoster, Unspecified 04/16/2021   Pertinent  Health Maintenance Due  Topic Date Due   DEXA SCAN  Never done   INFLUENZA VACCINE  05/12/2024      01/17/2021    3:00 PM 01/17/2021   10:00 PM 01/18/2021   12:00 PM 01/18/2021    8:30 PM 01/19/2021    9:00 AM  Fall Risk  (RETIRED) Patient Fall Risk Level High fall risk High fall risk High fall risk High fall risk High fall risk   Functional Status Survey:     Vitals:   02/29/24 0935  BP: 115/70  Pulse: 76  Resp: 18  Temp: (!) 97.3 F (36.3 C)  SpO2: 96%  Weight: 173 lb 6.4 oz (78.7 kg)  Height: 5\' 8"  (1.727 m)   Body mass index is 26.37 kg/m.  Wt Readings from Last 3 Encounters:  02/29/24 173 lb 6.4 oz (78.7 kg)  02/09/24 183 lb (83 kg)  02/01/24 183 lb (83 kg)    Physical Exam Constitutional:      General: She is not in acute distress.    Appearance: She is well-developed. She is not diaphoretic.  HENT:     Head: Normocephalic and atraumatic.     Mouth/Throat:     Pharynx: No oropharyngeal exudate.  Eyes:     Conjunctiva/sclera: Conjunctivae normal.     Pupils: Pupils are equal, round, and reactive to light.  Cardiovascular:     Rate and Rhythm: Normal rate and regular rhythm.     Heart sounds: Normal heart sounds.  Pulmonary:     Effort: Pulmonary effort is normal.     Breath sounds: Normal breath sounds.  Abdominal:     General: Bowel sounds are normal.     Palpations: Abdomen is soft.  Musculoskeletal:     Cervical back: Normal range of motion and neck supple.     Right lower leg: No edema.     Left lower leg: No edema.  Skin:    General: Skin is warm and dry.  Neurological:     Mental Status: She is alert.  Psychiatric:        Mood and Affect: Mood normal.     Labs reviewed: Recent Labs    01/22/24 0540 01/23/24 0421 01/24/24 0210 01/26/24 0442 01/31/24 0000 02/17/24 0000  NA 136 137 135  --  133* 137  K 2.8* 4.3 4.0  --  3.6 4.2  CL 96* 98 95*  --  92* 97*  CO2 32 31 32  --  33* 28*  GLUCOSE 130* 116* 114*  --   --   --   BUN 21 21 21   --  25* 15  CREATININE 0.73 0.71 0.66 0.75 0.7 0.6  CALCIUM  7.7* 8.0* 8.1*  --  8.4* 8.6*  MG 2.3  --   --   --   --   --    Recent Labs    01/06/24 0436 01/19/24 0948 01/31/24 0000  AST 16 19 12*  ALT 14 13 18   ALKPHOS 47 131* 183*  BILITOT 1.0 1.0  --   PROT 5.7* 6.6  --   ALBUMIN 3.1* 3.5 3.5   Recent Labs    01/22/24 0540 01/23/24 0421  01/24/24 0210 01/31/24 0000 02/17/24 0000  WBC 6.7 5.3 4.8 11.8 8.2  NEUTROABS 5.0 3.2 3.1 10,231.00 6,642.00  HGB 12.8 12.5 12.3 12.8 13.1  HCT 37.9 38.6 37.9 39 41  MCV 92.7 96.3 95.0  --   --   PLT 243 223 223 202 195   Lab Results  Component Value Date   TSH 1.60 02/17/2024   Lab Results  Component Value Date   HGBA1C 5.3 01/05/2024   Lab Results  Component Value Date   CHOL 146 01/06/2024   HDL 42 01/06/2024   LDLCALC 86 01/06/2024   TRIG 89 01/06/2024   CHOLHDL 3.5 01/06/2024    Significant Diagnostic Results in last 30 days:  No results found.  Assessment/Plan  1. Chronic bilateral low back pain without sciatica (Primary) Continues on therapy with tylenol   as needed  2. Late effect of cerebrovascular accident (CVA) No new deficits but continues to have photophobia and memory loss. Continues on plavix  and statin  3. Anxiety Discussed weaning from valium  due to side effects and not indicated for long term use or in the geriatric population  - she is agreeable Also discussed SW consult for lifestyle modifications to help with anxiety - diazepam  (VALIUM ) 2 MG tablet; Q 12 hours PRN for 1 week then q day PRN for 1 day x 1 week  Dispense: 30 tablet; Refill: 0 - escitalopram (LEXAPRO) 20 MG tablet; Take 1 tablet (20 mg total) by mouth daily.  4. Primary hypertension -Blood pressure well controlled, goal bp <140/90 Continue current medications and dietary modifications follow metabolic panel  5. Dysphagia, unspecified type Continue dietary modifications and therapy    Carles Florea K. Denney Fisherman [facility] [phone]

## 2024-02-29 NOTE — Telephone Encounter (Signed)
SNF resident.  

## 2024-03-03 ENCOUNTER — Non-Acute Institutional Stay: Payer: Self-pay | Admitting: Student

## 2024-03-03 ENCOUNTER — Encounter: Payer: Self-pay | Admitting: Student

## 2024-03-03 NOTE — Progress Notes (Unsigned)
 This encounter was created in error - please disregard.

## 2024-03-11 ENCOUNTER — Encounter: Payer: Self-pay | Admitting: Nurse Practitioner

## 2024-03-13 ENCOUNTER — Non-Acute Institutional Stay (SKILLED_NURSING_FACILITY): Payer: Self-pay | Admitting: Student

## 2024-03-13 ENCOUNTER — Encounter: Payer: Self-pay | Admitting: Student

## 2024-03-13 DIAGNOSIS — G8929 Other chronic pain: Secondary | ICD-10-CM

## 2024-03-13 DIAGNOSIS — R519 Headache, unspecified: Secondary | ICD-10-CM

## 2024-03-13 DIAGNOSIS — M545 Low back pain, unspecified: Secondary | ICD-10-CM

## 2024-03-13 DIAGNOSIS — R8290 Unspecified abnormal findings in urine: Secondary | ICD-10-CM | POA: Diagnosis not present

## 2024-03-13 DIAGNOSIS — R350 Frequency of micturition: Secondary | ICD-10-CM | POA: Diagnosis not present

## 2024-03-13 DIAGNOSIS — Z9181 History of falling: Secondary | ICD-10-CM

## 2024-03-13 DIAGNOSIS — H539 Unspecified visual disturbance: Secondary | ICD-10-CM

## 2024-03-13 LAB — CBC AND DIFFERENTIAL
HCT: 37 (ref 36–46)
Hemoglobin: 11.8 — AB (ref 12.0–16.0)
Platelets: 162 10*3/uL (ref 150–400)
WBC: 3.9

## 2024-03-13 LAB — CBC: RBC: 3.91 (ref 3.87–5.11)

## 2024-03-13 NOTE — Progress Notes (Signed)
 Location:  Other [facility].  Nursing Home Room Number: [facility] 116A Place of Service:  SNF [phone]) Provider:  Dr. Valrie Gehrig  PCP: Rory Collard, MD  Patient Care Team: Rory Collard, MD as PCP - General [facility] Medicine)  Extended Emergency Contact Information Primary Emergency Contact: Linnell Richardson Address: [address]          [place], [place] [address] Home Phone: [phone] Relation: Daughter  Code Status:  DNR Goals of care: Advanced Directive information    02/29/2024    9:42 AM  Advanced Directives  Does Patient Have a Medical Advance Directive? Yes  Type of Advance Directive Out of facility DNR (pink MOST or yellow form)  Does patient want to make changes to medical advance directive? No - Patient declined     Chief Complaint  Patient presents with   Dysuria    Dysuria    HPI:  Pt is a 89 y.o. female seen today for an acute visit for Dysuria History of Present Illness The patient presents with persistent neck pain and urinary symptoms.  She experiences persistent neck pain, described as a numb feeling at times, with pain radiating from the neck to the ear.  She has urinary symptoms, including chills when urinating for the past two weeks, but no burning sensation. She experiences frequent urination, with episodes occurring every 25 minutes at night, leading to occasional urinary incontinence. She recalls a recent incident of urgency resulting in wetting her undergarments.  She has recently developed headaches, which are new for her. The pain is sometimes located across the forehead and other times around the ear. No history of migraines, but her daughter suffers from them.  She reports changes in vision over the past month, particularly difficulty reading small print, which she used to do with her grandson. She describes a burning sensation in one eye when attempting to read and is currently using her old glasses as she does not have newer  ones.  She recalls a fall in the bathroom, which led to hospitalization for back pain. She remembers feeling dizzy and everything spinning around her before the fall. No dizziness during physical therapy sessions.    Past Medical History:  Diagnosis Date   Anxiety    Breast cancer (HCC)    1990   Complication of anesthesia    Depression    Dyspnea    DOE   GERD (gastroesophageal reflux disease)    Headache(784.0)    HOH (hard of hearing)    Hypertension    Lymphoma (HCC)    Pain    CHRONIC BACK   Pain    CHRONIC BACK   Pneumonia    PONV (postoperative nausea and vomiting)    Tremors of nervous system    HANDS OCCAS   Past Surgical History:  Procedure Laterality Date   APPENDECTOMY     BACK SURGERY     BREAST SURGERY     CATARACT EXTRACTION W/PHACO Left 01/26/2017   Procedure: CATARACT EXTRACTION PHACO AND INTRAOCULAR LENS PLACEMENT (IOC);  Surgeon: Clair Crews, MD;  Location: [facility] ORS;  Service: Ophthalmology;  Laterality: Left;  US  53.7 AP% 20.3 CDE 10.90 Fluid pack lot # [id number]   CATARACT EXTRACTION W/PHACO Right 02/16/2017   Procedure: CATARACT EXTRACTION PHACO AND INTRAOCULAR LENS PLACEMENT (IOC);  Surgeon: Clair Crews, MD;  Location: [facility] ORS;  Service: Ophthalmology;  Laterality: Right;  US  00:55 AP% 22.5 CDE 12.43 Fluid pack lot # [id number]   CESAREAN SECTION  CHOLECYSTECTOMY N/A 03/20/2018   Procedure: LAPAROSCOPIC CHOLECYSTECTOMY;  Surgeon: Eldred Grego, MD;  Location: [facility] ORS;  Service: General;  Laterality: N/A;   MASTECTOMY Right 1992   TUBAL LIGATION      Allergies  Allergen Reactions   Amoxicillin Swelling    Blisters.    Aspirin Other (See Comments)    Stomach ulcers.   Benadryl [Diphenhydramine Hcl] Other (See Comments)    Hyperactive.    Outpatient Encounter Medications as of 03/13/2024  Medication Sig   acetaminophen  (TYLENOL ) 325 MG tablet Take 2 tablets (650 mg total) by mouth every 6 (six) hours as needed for mild  pain (pain score 1-3), fever or headache.   atorvastatin  (LIPITOR) 40 MG tablet Take 1 tablet (40 mg total) by mouth daily.   cholecalciferol (VITAMIN D) 1000 units tablet Take 2,000 Units by mouth daily.   clopidogrel  (PLAVIX ) 75 MG tablet Take 1 tablet (75 mg total) by mouth daily.   diazepam  (VALIUM ) 2 MG tablet Q 12 hours PRN for 1 week then q day PRN for 1 day x 1 week (Patient taking differently: Give one tablet by mouth every 24 hours as needed.)   docusate sodium  (COLACE) 100 MG capsule Take 100 mg by mouth daily as needed for mild constipation or moderate constipation.   escitalopram  (LEXAPRO ) 20 MG tablet Take 1 tablet (20 mg total) by mouth daily.   fluticasone  (FLONASE ) 50 MCG/ACT nasal spray Place 2 sprays into both nostrils daily.   hydrochlorothiazide  (HYDRODIURIL ) 25 MG tablet Take 25 mg by mouth daily.   KLOR-CON  M20 20 MEQ tablet Take 20 mEq by mouth in the morning and at bedtime.   meclizine  (ANTIVERT ) 25 MG tablet Take 25 mg by mouth 3 (three) times daily as needed. For dizziness   OXYGEN 2lpm every shift   polyethylene glycol (MIRALAX  / GLYCOLAX ) 17 g packet Take 17 g by mouth daily.   propranolol (INDERAL) 20 MG tablet Take 20 mg by mouth 2 (two) times daily.   vitamin B-12 (CYANOCOBALAMIN ) 500 MCG tablet Take 500 mcg by mouth daily.   sertraline  (ZOLOFT ) 100 MG tablet Take 150 mg by mouth at bedtime. (Patient not taking: Reported on 03/13/2024)   No facility-administered encounter medications on file as of 03/13/2024.    Review of Systems  Immunization History  Administered Date(s) Administered   Influenza, High Dose Seasonal PF 07/14/2017, 07/26/2023   PFIZER Comirnaty(Gray Top)Covid-19 Tri-Sucrose Vaccine 10/19/2019, 11/09/2019, 10/17/2020, 04/16/2021   PFIZER(Purple Top)SARS-COV-2 Vaccination 10/19/2019, 11/09/2019   Pneumococcal-Unspecified 04/10/2000   Rsv, Bivalent, Protein Subunit Rsvpref,pf Pattricia Bores) 08/02/2023   Tdap 03/29/2014, 07/09/2015   Zoster,  Unspecified 04/16/2021   Pertinent  Health Maintenance Due  Topic Date Due   DEXA SCAN  Never done   INFLUENZA VACCINE  05/12/2024      01/17/2021    3:00 PM 01/17/2021   10:00 PM 01/18/2021   12:00 PM 01/18/2021    8:30 PM 01/19/2021    9:00 AM  Fall Risk  (RETIRED) Patient Fall Risk Level High fall risk High fall risk High fall risk High fall risk High fall risk   Functional Status Survey:    Vitals:   03/13/24 0945  BP: 127/78  Pulse: 75  Resp: 18  Temp: (!) 97.5 F (36.4 C)  SpO2: 95%  Weight: 175 lb (79.4 kg)  Height: 5\' 8"  (1.727 m)   Body mass index is 26.61 kg/m. Physical Exam Constitutional:      Appearance: Normal appearance.  Cardiovascular:  Rate and Rhythm: Normal rate and regular rhythm.     Pulses: Normal pulses.     Heart sounds: Normal heart sounds.  Pulmonary:     Effort: Pulmonary effort is normal.  Abdominal:     General: Abdomen is flat. Bowel sounds are normal.     Palpations: Abdomen is soft.  Musculoskeletal:        General: No swelling or tenderness.  Skin:    General: Skin is warm and dry.  Neurological:     Mental Status: She is alert. Mental status is at baseline.  Psychiatric:        Mood and Affect: Mood normal.     Labs reviewed: Recent Labs    01/22/24 0540 01/23/24 0421 01/24/24 0210 01/26/24 0442 01/31/24 0000 02/17/24 0000  NA 136 137 135  --  133* 137  K 2.8* 4.3 4.0  --  3.6 4.2  CL 96* 98 95*  --  92* 97*  CO2 32 31 32  --  33* 28*  GLUCOSE 130* 116* 114*  --   --   --   BUN 21 21 21   --  25* 15  CREATININE 0.73 0.71 0.66 0.75 0.7 0.6  CALCIUM  7.7* 8.0* 8.1*  --  8.4* 8.6*  MG 2.3  --   --   --   --   --    Recent Labs    01/06/24 0436 01/19/24 0948 01/31/24 0000  AST 16 19 12*  ALT 14 13 18   ALKPHOS 47 131* 183*  BILITOT 1.0 1.0  --   PROT 5.7* 6.6  --   ALBUMIN 3.1* 3.5 3.5   Recent Labs    01/22/24 0540 01/23/24 0421 01/24/24 0210 01/31/24 0000 02/17/24 0000  WBC 6.7 5.3 4.8 11.8 8.2   NEUTROABS 5.0 3.2 3.1 10,231.00 6,642.00  HGB 12.8 12.5 12.3 12.8 13.1  HCT 37.9 38.6 37.9 39 41  MCV 92.7 96.3 95.0  --   --   PLT 243 223 223 202 195   Lab Results  Component Value Date   TSH 1.60 02/17/2024   Lab Results  Component Value Date   HGBA1C 5.3 01/05/2024   Lab Results  Component Value Date   CHOL 146 01/06/2024   HDL 42 01/06/2024   LDLCALC 86 01/06/2024   TRIG 89 01/06/2024   CHOLHDL 3.5 01/06/2024    Significant Diagnostic Results in last 30 days:  No results found.  Assessment/Plan Urinary frequency and urgency Reports urinary frequency with episodes of urgency and incontinence for approximately two weeks. No dysuria. Experiences chills during urination. Nocturia with urination every 25 minutes on some nights. - Encourage increased fluid intake, specifically cranberry juice, aiming for at least three cups per day.  Fall with back pain Fall in the bathroom leading to hospitalization. Currently residing in a healthcare facility for rehabilitation and potential long-term care.  Headache New onset headaches with pain across the forehead and around the ear. No prior history of headaches or migraines. Mild headache present during the visit.  Vision changes Recent onset of difficulty reading small print and sensation of eye burning for approximately one month. No new glasses since symptoms began. Uses old glasses without improvement.  Family/ staff Communication: nursing  Labs/tests ordered:  UA, CBC

## 2024-03-13 NOTE — Telephone Encounter (Signed)
 Message routed to Ulyses Gandy, NP due to Gilbert Lab, NP being out of office. Patient would need to contact their PCP for this medication correct?

## 2024-03-18 ENCOUNTER — Encounter: Payer: Self-pay | Admitting: Student

## 2024-04-02 ENCOUNTER — Emergency Department

## 2024-04-02 ENCOUNTER — Other Ambulatory Visit: Payer: Self-pay

## 2024-04-02 ENCOUNTER — Emergency Department
Admission: EM | Admit: 2024-04-02 | Discharge: 2024-04-02 | Disposition: A | Discharge status: Home / Self Care | Attending: Emergency Medicine | Admitting: Emergency Medicine

## 2024-04-02 DIAGNOSIS — I10 Essential (primary) hypertension: Secondary | ICD-10-CM | POA: Insufficient documentation

## 2024-04-02 DIAGNOSIS — G4489 Other headache syndrome: Secondary | ICD-10-CM | POA: Diagnosis not present

## 2024-04-02 DIAGNOSIS — N390 Urinary tract infection, site not specified: Secondary | ICD-10-CM | POA: Insufficient documentation

## 2024-04-02 DIAGNOSIS — R29818 Other symptoms and signs involving the nervous system: Secondary | ICD-10-CM | POA: Diagnosis not present

## 2024-04-02 DIAGNOSIS — R4182 Altered mental status, unspecified: Secondary | ICD-10-CM | POA: Diagnosis present

## 2024-04-02 DIAGNOSIS — R9089 Other abnormal findings on diagnostic imaging of central nervous system: Secondary | ICD-10-CM | POA: Diagnosis not present

## 2024-04-02 DIAGNOSIS — G459 Transient cerebral ischemic attack, unspecified: Secondary | ICD-10-CM | POA: Diagnosis not present

## 2024-04-02 DIAGNOSIS — I6782 Cerebral ischemia: Secondary | ICD-10-CM | POA: Diagnosis not present

## 2024-04-02 DIAGNOSIS — R410 Disorientation, unspecified: Secondary | ICD-10-CM | POA: Diagnosis not present

## 2024-04-02 LAB — CBC
HCT: 43.2 % (ref 36.0–46.0)
Hemoglobin: 13.7 g/dL (ref 12.0–15.0)
MCH: 30.2 pg (ref 26.0–34.0)
MCHC: 31.7 g/dL (ref 30.0–36.0)
MCV: 95.2 fL (ref 80.0–100.0)
Platelets: 169 10*3/uL (ref 150–400)
RBC: 4.54 MIL/uL (ref 3.87–5.11)
RDW: 14.5 % (ref 11.5–15.5)
WBC: 6.8 10*3/uL (ref 4.0–10.5)
nRBC: 0 % (ref 0.0–0.2)

## 2024-04-02 LAB — URINALYSIS, ROUTINE W REFLEX MICROSCOPIC
Bilirubin Urine: NEGATIVE
Glucose, UA: NEGATIVE mg/dL
Hgb urine dipstick: NEGATIVE
Ketones, ur: NEGATIVE mg/dL
Nitrite: NEGATIVE
Protein, ur: NEGATIVE mg/dL
Specific Gravity, Urine: 1.023 (ref 1.005–1.030)
pH: 5 (ref 5.0–8.0)

## 2024-04-02 LAB — COMPREHENSIVE METABOLIC PANEL WITH GFR
ALT: 17 U/L (ref 0–44)
AST: 22 U/L (ref 15–41)
Albumin: 3.5 g/dL (ref 3.5–5.0)
Alkaline Phosphatase: 69 U/L (ref 38–126)
Anion gap: 8 (ref 5–15)
BUN: 25 mg/dL — ABNORMAL HIGH (ref 8–23)
CO2: 27 mmol/L (ref 22–32)
Calcium: 8.8 mg/dL — ABNORMAL LOW (ref 8.9–10.3)
Chloride: 103 mmol/L (ref 98–111)
Creatinine, Ser: 0.68 mg/dL (ref 0.44–1.00)
GFR, Estimated: >60 mL/min (ref >60)
Glucose, Bld: 112 mg/dL — ABNORMAL HIGH (ref 70–99)
Potassium: 4.3 mmol/L (ref 3.5–5.1)
Sodium: 138 mmol/L (ref 135–145)
Total Bilirubin: 1 mg/dL (ref 0.0–1.2)
Total Protein: 6.4 g/dL — ABNORMAL LOW (ref 6.5–8.1)

## 2024-04-02 LAB — TROPONIN I (HIGH SENSITIVITY): Troponin I (High Sensitivity): 5 ng/L (ref <18)

## 2024-04-02 MED ORDER — FOSFOMYCIN TROMETHAMINE 3 G PO PACK
3.0000 g | PACK | Freq: Once | ORAL | Status: AC
  Administered 2024-04-02: 3 g via ORAL
  Filled 2024-04-02: qty 3

## 2024-04-02 NOTE — ED Provider Notes (Signed)
 [facility] Provider Note    Event Date/Time   First MD Initiated Contact with Patient 04/02/24 1505     (approximate)  History   Chief Complaint: Altered Mental Status  HPI  Lindsey Barnes is a 89 y.o. female with a past medical history of anxiety, hypertension, CVA x 2, presents to the emergency department for trouble speaking.  According to the family members here with the patient she was complaining of a headache last night although denies any headache today.  Family members noted since this morning that her words have been jumbled.  Patient continues to have some difficulty speaking during my evaluation.  Speaks most words appropriately however approximately 10% of the patient's words are inappropriate or nonsensical.  Family member states in March when the patient had her first stroke she did have similar type symptoms but for the most part the symptoms have improved until last night.  Patient is in no distress.  She is immediately asking to go home, but is agreeable to stay for the workup.  Physical Exam   Triage Vital Signs: ED Triage Vitals [04/02/24 1402]  Encounter Vitals Group     BP (!) 144/82     Girls Systolic BP Percentile      Girls Diastolic BP Percentile      Boys Systolic BP Percentile      Boys Diastolic BP Percentile      Pulse Rate 72     Resp 18     Temp 97.9 F (36.6 C)     Temp Source Oral     SpO2 97 %     Weight      Height      Head Circumference      Peak Flow      Pain Score      Pain Loc      Pain Education      Exclude from Growth Chart     Most recent vital signs: Vitals:   04/02/24 1402  BP: (!) 144/82  Pulse: 72  Resp: 18  Temp: 97.9 F (36.6 C)  SpO2: 97%    General: Awake, no distress.  CV:  Good peripheral perfusion.  Regular rate and rhythm  Resp:  Normal effort.  Equal breath sounds bilaterally.  Abd:  No distention.  Soft, nontender.  No rebound or guarding. Other:  Equal grip strength  bilaterally.  No pronator drift.  Patient has 5/5 motor bilateral lower extremities.  Cranial nerves intact.  Does on occasion miss speak says the wrong word or an unintelligible word during exam.   ED Results / Procedures / Treatments   EKG  EKG viewed and interpreted by myself shows a normal sinus rhythm at 72 bpm with a borderline widened QRS, left axis deviation.  Nonspecific ST changes without ST elevation  RADIOLOGY  MRI read as negative for acute infarct.   MEDICATIONS ORDERED IN ED: Medications - No data to display   IMPRESSION / MDM / ASSESSMENT AND PLAN / ED COURSE  I reviewed the triage vital signs and the nursing notes.  Patient's presentation is most consistent with acute presentation with potential threat to life or bodily function.  Patient presents to the emergency department with a headache last night denies any headache today but the patient is having some word finding difficulty/aphasia.  Overall the patient appears well, no distress.  Reassuring neurological exam besides occasional speech difficulty.  Patient CBC is normal chemistry is reassuring, urinalysis shows no  significant findings we will send a urine culture as a precaution.  Troponin is pending.  Will proceed with an MRI to evaluate for a new CVA.  Is also possible that the patient could be experiencing exacerbation of her prior stroke symptoms.  Patient's workup today shows a normal CBC, reassuring chemistry negative troponin.  Patient's urinalysis does show signs of a mild urinary tract infection with 11-20 white cells and rare bacteria we will send a urine culture and treat with Keflex.  Patient's MRI is negative for acute infarct although there is some motion degradation.  I spoke with Dr. Matthews of neurology given no acute finding on the MRI patient's symptoms could be a recurrence of her prior symptoms from her stroke in March due to a urinary tract infection.  With no new stroke do not see any reason to  admit to the hospital patient appears well overall and she is ready to go home.  Will arrange transport back to her nursing facility.  Discharge on Keflex.  Patient and family agreeable to this plan.  FINAL CLINICAL IMPRESSION(S) / ED DIAGNOSES   Urinary tract infection   Note:  This document was prepared using Dragon voice recognition software and may include unintentional dictation errors.   Dorothyann Drivers, MD 04/02/24 [phone]

## 2024-04-02 NOTE — ED Notes (Signed)
 This tech and Noelle EDT assisted pt to the bathroom. Pt had a BM and urinated in toilet. Pt was assisted back into bed. Call bell in reach.

## 2024-04-02 NOTE — ED Triage Notes (Signed)
 BIBEMS, coming from [facility]. Last night @midnight  had a HA. LKW: 8am started having 'word salad'. GCS 15 baseline, currently GCS 14. Pt is not reporting any aphasia. NIHSS 0. VSS. BGL: 164. DNR. PMH: HTN, CVA

## 2024-04-02 NOTE — Discharge Instructions (Addendum)
 You have been seen in the emergency department for an evaluation.  Your MRI does not appear to show any new stroke or abnormality.  You have been given a one-time dose of antibiotics in the emergency department.  No further antibiotics are needed.  Please follow-up with your doctor in the next 2 to 3 days for recheck/reevaluation.  Return to the emergency department for any worsening symptoms or any other symptom concerning to yourself.

## 2024-04-03 ENCOUNTER — Non-Acute Institutional Stay (SKILLED_NURSING_FACILITY): Payer: Self-pay | Admitting: Student

## 2024-04-03 DIAGNOSIS — I6930 Unspecified sequelae of cerebral infarction: Secondary | ICD-10-CM | POA: Diagnosis not present

## 2024-04-03 DIAGNOSIS — K219 Gastro-esophageal reflux disease without esophagitis: Secondary | ICD-10-CM

## 2024-04-03 DIAGNOSIS — G8929 Other chronic pain: Secondary | ICD-10-CM

## 2024-04-03 DIAGNOSIS — E7800 Pure hypercholesterolemia, unspecified: Secondary | ICD-10-CM

## 2024-04-03 DIAGNOSIS — M545 Low back pain, unspecified: Secondary | ICD-10-CM

## 2024-04-04 LAB — URINE CULTURE: Culture: 30000 — AB

## 2024-04-11 ENCOUNTER — Encounter: Payer: Self-pay | Admitting: Nurse Practitioner

## 2024-04-11 ENCOUNTER — Non-Acute Institutional Stay (SKILLED_NURSING_FACILITY): Payer: Self-pay | Admitting: Nurse Practitioner

## 2024-04-11 DIAGNOSIS — M545 Low back pain, unspecified: Secondary | ICD-10-CM | POA: Diagnosis not present

## 2024-04-11 DIAGNOSIS — G8929 Other chronic pain: Secondary | ICD-10-CM | POA: Diagnosis not present

## 2024-04-11 DIAGNOSIS — I10 Essential (primary) hypertension: Secondary | ICD-10-CM | POA: Diagnosis not present

## 2024-04-11 DIAGNOSIS — L600 Ingrowing nail: Secondary | ICD-10-CM

## 2024-04-11 DIAGNOSIS — I6930 Unspecified sequelae of cerebral infarction: Secondary | ICD-10-CM | POA: Diagnosis not present

## 2024-04-11 DIAGNOSIS — F419 Anxiety disorder, unspecified: Secondary | ICD-10-CM | POA: Diagnosis not present

## 2024-04-11 NOTE — Progress Notes (Unsigned)
 Location:  Other [facility].  Nursing Home Room Number: Learta Beagle [id number] Place of Service:  SNF (31) Harlene An, NP  PCP: Glover Lenis, MD  Patient Care Team: Glover Lenis, MD as PCP - General [facility])  Extended Emergency Contact Information Primary Emergency Contact: [facility] Address: [address]          [place], [place] [address] Home Phone: [phone] Relation: Daughter  Goals of care: Advanced Directive information    04/02/2024    3:24 PM  Advanced Directives  Does Patient Have a Medical Advance Directive? Yes  Type of Advance Directive Out of facility DNR (pink MOST or yellow form)     Chief Complaint  Patient presents with   Medical Management of Chronic Issues    Medical Management of Chronic Issues.     HPI:  Pt is a 89 y.o. female seen today for medical management of chronic disease.  Pt with hx of anxiety, GERD, hyperlipidemia, htn, constipation, cva She is now long term at [facility].  Recently noted her right big toe was having more pain and discomfort with redness.  Progressively has gotten worse. Nursing noted purulent drainage coming from side of toenail.  No fevers or chills.   Past Medical History:  Diagnosis Date   Anxiety    Breast cancer (HCC)    1990   Complication of anesthesia    Depression    Dyspnea    DOE   GERD (gastroesophageal reflux disease)    Headache(784.0)    HOH (hard of hearing)    Hypertension    Lymphoma (HCC)    Pain    CHRONIC BACK   Pain    CHRONIC BACK   Pneumonia    PONV (postoperative nausea and vomiting)    Tremors of nervous system    HANDS OCCAS   Past Surgical History:  Procedure Laterality Date   APPENDECTOMY     BACK SURGERY     BREAST SURGERY     CATARACT EXTRACTION W/PHACO Left 01/26/2017   Procedure: CATARACT EXTRACTION PHACO AND INTRAOCULAR LENS PLACEMENT (IOC);  Surgeon: Elsie Carmine, MD;  Location: [facility] ORS;  Service: Ophthalmology;  Laterality: Left;  US   53.7 AP% 20.3 CDE 10.90 Fluid pack lot # [id number]   CATARACT EXTRACTION W/PHACO Right 02/16/2017   Procedure: CATARACT EXTRACTION PHACO AND INTRAOCULAR LENS PLACEMENT (IOC);  Surgeon: Carmine Elsie, MD;  Location: [facility] ORS;  Service: Ophthalmology;  Laterality: Right;  US  00:55 AP% 22.5 CDE 12.43 Fluid pack lot # [id number]   CESAREAN SECTION     CHOLECYSTECTOMY N/A 03/20/2018   Procedure: LAPAROSCOPIC CHOLECYSTECTOMY;  Surgeon: Rodolph Romano, MD;  Location: [facility] ORS;  Service: General;  Laterality: N/A;   MASTECTOMY Right 1992   TUBAL LIGATION      Allergies  Allergen Reactions   Amoxicillin Swelling    Blisters.    Aspirin Other (See Comments)    Stomach ulcers.   Benadryl [Diphenhydramine Hcl] Other (See Comments)    Hyperactive.    Outpatient Encounter Medications as of 04/11/2024  Medication Sig   acetaminophen  (TYLENOL ) 325 MG tablet Take 2 tablets (650 mg total) by mouth every 6 (six) hours as needed for mild pain (pain score 1-3), fever or headache.   atorvastatin  (LIPITOR) 40 MG tablet Take 1 tablet (40 mg total) by mouth daily.   Baclofen 5 MG TABS Take 5 mg by mouth every 8 (eight) hours as needed.   cholecalciferol (VITAMIN D) 1000 units tablet Take 2,000 Units  by mouth daily.   clopidogrel  (PLAVIX ) 75 MG tablet Take 1 tablet (75 mg total) by mouth daily.   docusate sodium  (COLACE) 100 MG capsule Take 100 mg by mouth daily as needed for mild constipation or moderate constipation.   doxycycline (ADOXA) 100 MG tablet Take 100 mg by mouth 2 (two) times daily.   escitalopram  (LEXAPRO ) 20 MG tablet Take 1 tablet (20 mg total) by mouth daily.   fluticasone  (FLONASE ) 50 MCG/ACT nasal spray Place 2 sprays into both nostrils daily.   hydrochlorothiazide  (HYDRODIURIL ) 25 MG tablet Take 25 mg by mouth daily.   hydroxypropyl methylcellulose / hypromellose (ISOPTO TEARS / GONIOVISC) 2.5 % ophthalmic solution Place 1 drop into both eyes every 6 (six) hours as needed for dry  eyes.   KLOR-CON  M20 20 MEQ tablet Take 20 mEq by mouth in the morning and at bedtime.   Magnesium Sulfate (EPSOM SALT) POWD by Does not apply route. Apply to bilateral feet foot soaks topically three times daily for bilateral foot soaks for right great toe pickness/soreness warm water/epsom salt.   meclizine  (ANTIVERT ) 25 MG tablet Take 25 mg by mouth 3 (three) times daily as needed. For dizziness   melatonin 1 MG TABS tablet Take 1 mg by mouth at bedtime.   OXYGEN 2lpm every shift   polyethylene glycol (MIRALAX  / GLYCOLAX ) 17 g packet Take 17 g by mouth daily.   propranolol (INDERAL) 20 MG tablet Take 20 mg by mouth 2 (two) times daily.   vitamin B-12 (CYANOCOBALAMIN ) 500 MCG tablet Take 500 mcg by mouth daily.   [DISCONTINUED] diazepam  (VALIUM ) 2 MG tablet Q 12 hours PRN for 1 week then q day PRN for 1 day x 1 week (Patient not taking: Reported on 04/11/2024)   [DISCONTINUED] sertraline  (ZOLOFT ) 100 MG tablet Take 150 mg by mouth at bedtime. (Patient not taking: Reported on 04/11/2024)   No facility-administered encounter medications on file as of 04/11/2024.    Review of Systems  Constitutional:  Negative for activity change, appetite change, fatigue and unexpected weight change.  HENT:  Negative for congestion and hearing loss.   Eyes: Negative.   Respiratory:  Negative for cough and shortness of breath.   Cardiovascular:  Negative for chest pain, palpitations and leg swelling.  Gastrointestinal:  Negative for abdominal pain, constipation and diarrhea.  Genitourinary:  Negative for difficulty urinating and dysuria.  Musculoskeletal:  Negative for arthralgias and myalgias.  Skin:  Negative for color change and wound.  Neurological:  Negative for dizziness and weakness.  Psychiatric/Behavioral:  Negative for agitation, behavioral problems and confusion.      Immunization History  Administered Date(s) Administered   Influenza, High Dose Seasonal PF 07/14/2017, 07/26/2023   PFIZER  Comirnaty(Gray Top)Covid-19 Tri-Sucrose Vaccine 10/19/2019, 11/09/2019, 10/17/2020, 04/16/2021   PFIZER(Purple Top)SARS-COV-2 Vaccination 10/19/2019, 11/09/2019   Pneumococcal-Unspecified 04/10/2000   Rsv, Bivalent, Protein Subunit Rsvpref,pf Marlow) 08/02/2023   Tdap 03/29/2014, 07/09/2015   Zoster, Unspecified 04/16/2021   Pertinent  Health Maintenance Due  Topic Date Due   DEXA SCAN  Never done   INFLUENZA VACCINE  05/12/2024      01/17/2021    3:00 PM 01/17/2021   10:00 PM 01/18/2021   12:00 PM 01/18/2021    8:30 PM 01/19/2021    9:00 AM  Fall Risk  (RETIRED) Patient Fall Risk Level High fall risk  High fall risk  High fall risk  High fall risk  High fall risk      Data saved with a previous flowsheet  row definition   Functional Status Survey:    Vitals:   04/11/24 1220  BP: (!) 116/59  Pulse: 65  Resp: 16  Temp: 97.8 F (36.6 C)  SpO2: 95%  Weight: 177 lb 9.6 oz (80.6 kg)  Height: 5' 8 (1.727 m)   Body mass index is 27 kg/m. Physical Exam Constitutional:      General: She is not in acute distress.    Appearance: She is well-developed. She is not diaphoretic.  HENT:     Head: Normocephalic and atraumatic.     Mouth/Throat:     Pharynx: No oropharyngeal exudate.  Eyes:     Conjunctiva/sclera: Conjunctivae normal.     Pupils: Pupils are equal, round, and reactive to light.  Cardiovascular:     Rate and Rhythm: Normal rate and regular rhythm.     Heart sounds: Normal heart sounds.  Pulmonary:     Effort: Pulmonary effort is normal.     Breath sounds: Normal breath sounds.  Abdominal:     General: Bowel sounds are normal.     Palpations: Abdomen is soft.  Musculoskeletal:        General: No tenderness.     Cervical back: Normal range of motion and neck supple.  Feet:     Right foot:     Toenail Condition: Right toenails are ingrown.  Skin:    General: Skin is warm and dry.  Neurological:     Mental Status: She is alert and oriented to person, place, and  time.     Labs reviewed: Recent Labs    01/22/24 0540 01/23/24 0421 01/24/24 0210 01/26/24 0442 01/31/24 0000 02/17/24 0000 04/02/24 1424  NA 136 137 135  --  133* 137 138  K 2.8* 4.3 4.0  --  3.6 4.2 4.3  CL 96* 98 95*  --  92* 97* 103  CO2 32 31 32  --  33* 28* 27  GLUCOSE 130* 116* 114*  --   --   --  112*  BUN 21 21 21   --  25* 15 25*  CREATININE 0.73 0.71 0.66   < > 0.7 0.6 0.68  CALCIUM  7.7* 8.0* 8.1*  --  8.4* 8.6* 8.8*  MG 2.3  --   --   --   --   --   --    < > = values in this interval not displayed.   Recent Labs    01/06/24 0436 01/19/24 0948 01/31/24 0000 04/02/24 1424  AST 16 19 12* 22  ALT 14 13 18 17   ALKPHOS 47 131* 183* 69  BILITOT 1.0 1.0  --  1.0  PROT 5.7* 6.6  --  6.4*  ALBUMIN 3.1* 3.5 3.5 3.5   Recent Labs    01/23/24 0421 01/24/24 0210 01/24/24 0210 01/31/24 0000 02/17/24 0000 03/13/24 0000 04/02/24 1536  WBC 5.3 4.8   < > 11.8 8.2 3.9 6.8  NEUTROABS 3.2 3.1  --  10,231.00 6,642.00  --   --   HGB 12.5 12.3  --  12.8 13.1 11.8* 13.7  HCT 38.6 37.9  --  39 41 37 43.2  MCV 96.3 95.0  --   --   --   --  95.2  PLT 223 223  --  202 195 162 169   < > = values in this interval not displayed.   Lab Results  Component Value Date   TSH 1.60 02/17/2024   Lab Results  Component Value Date   HGBA1C  5.3 01/05/2024   Lab Results  Component Value Date   CHOL 146 01/06/2024   HDL 42 01/06/2024   LDLCALC 86 01/06/2024   TRIG 89 01/06/2024   CHOLHDL 3.5 01/06/2024    Significant Diagnostic Results in last 30 days:  MR BRAIN WO CONTRAST Result Date: 04/02/2024 CLINICAL DATA:  Neuro deficit, concern for stroke. Speech changes, Word salad. EXAM: MRI HEAD WITHOUT CONTRAST TECHNIQUE: Multiplanar, multiecho pulse sequences of the brain and surrounding structures were obtained without intravenous contrast. COMPARISON:  MRI head 01/05/2024. FINDINGS: Brain: Examination is somewhat limited due to motion artifact. No evidence of acute infarct.  T2/FLAIR hyperintensity in the periventricular and subcortical white matter again noted. No edema, mass effect, or midline shift. Extra-axial diffusion signal abnormality corresponding to known extra-axial mass over the right frontal lobe. Additional mass along the anterior falx appreciated on diffusion-weighted images. Mild parenchymal volume loss. Vascular: Normal flow voids. Skull and upper cervical spine: Sinuses/Orbits: Bilateral lens replacement. Paranasal sinuses relatively clear. Other: No mastoid effusion. IMPRESSION: Examination limited by motion. Within these limitations there is no acute intracranial abnormality. Similar chronic microvascular ischemic changes. Meningiomas over the right frontal lobe and anterior falx are somewhat obscured due to motion artifact but are similar in appearance on diffusion-weighted images. Electronically Signed   By: Donnice Mania M.D.   On: 04/02/2024 18:19    Assessment/Plan Anxiety Currently on lexapro  and doing well on current regimen.   HTN (hypertension) Blood pressure well controlled, goal bp <140/90 Continue current medications and dietary modifications follow metabolic panel   Ingrown toenail Warm epsom soaks TID until resolves Start doxycycline 100 mg by mouth daily for 7 days  Late effect of cerebrovascular accident (CVA) Stable, no worsening deficit. Continues on plavix .   Low back pain Stable at this time, continues tylenol  PRN     Jessica K. Caro BODILY [facility] [phone]

## 2024-04-12 DIAGNOSIS — L600 Ingrowing nail: Secondary | ICD-10-CM | POA: Insufficient documentation

## 2024-04-12 DIAGNOSIS — I6930 Unspecified sequelae of cerebral infarction: Secondary | ICD-10-CM | POA: Insufficient documentation

## 2024-04-12 DIAGNOSIS — F419 Anxiety disorder, unspecified: Secondary | ICD-10-CM | POA: Insufficient documentation

## 2024-04-12 NOTE — Assessment & Plan Note (Signed)
 Stable at this time, continues tylenol  PRN

## 2024-04-12 NOTE — Assessment & Plan Note (Signed)
 Warm epsom soaks TID until resolves Start doxycycline 100 mg by mouth daily for 7 days

## 2024-04-12 NOTE — Assessment & Plan Note (Signed)
 Blood pressure well controlled, goal bp <140/90 Continue current medications and dietary modifications follow metabolic panel

## 2024-04-12 NOTE — Assessment & Plan Note (Signed)
 Currently on lexapro  and doing well on current regimen.

## 2024-04-12 NOTE — Assessment & Plan Note (Signed)
 Stable, no worsening deficit. Continues on plavix .

## 2024-04-14 ENCOUNTER — Encounter: Payer: Self-pay | Admitting: Student

## 2024-04-14 NOTE — Progress Notes (Signed)
 Late Entry documentation   [facility]  Provider: ABDUL  Code Status: DNR Goals of Care:     04/02/2024    3:24 PM  Advanced Directives  Does Patient Have a Medical Advance Directive? Yes  Type of Advance Directive Out of facility DNR (pink MOST or yellow form)     No chief complaint on file.   HPI: Patient is a 89 y.o. female seen today for a routine visit.  Discussed the use of AI scribe software for clinical note transcription with the patient, who gave verbal consent to proceed.  History of Present Illness   History of Present Illness The patient is an 89 year old who presents with concerns of a recent stroke.  She was informed of having a stroke but does not recall specific symptoms such as confusion or speech changes at the time. Today, she feels tired but reports no other acute symptoms.  She resides in a nursing home and is oriented to her location, name, and birthdate, though she initially misstated the current year as 2025. She recalls her past, including playing basketball in her youth.  Yesterday morning, she experienced difficulty speaking, describing it as 'couldn't get them out,' which resolved after some time. She refers to these episodes as 'spells.'  She wants to avoid being 'a cripple' and acknowledges her daughter's involvement in her care decisions, though she is unsure of her daughter's current whereabouts.  No issues with bowel movements and no other acute symptoms aside from feeling tired.  Past Medical History - History of stroke - History of transient ischemic attacks (TIAs) - History of high cholesterol  Medications - Cholesterol medicine - Aspirin  Social History - Originally from [facility], [place]  - Played basketball  Assessment and Plan  Past Medical History:  Diagnosis Date   Anxiety    Breast cancer (HCC)    1990   Complication of anesthesia    Depression    Dyspnea    DOE   GERD  (gastroesophageal reflux disease)    Headache(784.0)    HOH (hard of hearing)    Hypertension    Lymphoma (HCC)    Pain    CHRONIC BACK   Pain    CHRONIC BACK   Pneumonia    PONV (postoperative nausea and vomiting)    Tremors of nervous system    HANDS OCCAS    Past Surgical History:  Procedure Laterality Date   APPENDECTOMY     BACK SURGERY     BREAST SURGERY     CATARACT EXTRACTION W/PHACO Left 01/26/2017   Procedure: CATARACT EXTRACTION PHACO AND INTRAOCULAR LENS PLACEMENT (IOC);  Surgeon: Elsie Carmine, MD;  Location: [facility];  Service: Ophthalmology;  Laterality: Left;  US  53.7 AP% 20.3 CDE 10.90 Fluid pack lot # [id number]   CATARACT EXTRACTION W/PHACO Right 02/16/2017   Procedure: CATARACT EXTRACTION PHACO AND INTRAOCULAR LENS PLACEMENT (IOC);  Surgeon: Carmine Elsie, MD;  Location: [facility];  Service: Ophthalmology;  Laterality: Right;  US  00:55 AP% 22.5 CDE 12.43 Fluid pack lot # [id number]   CESAREAN SECTION     CHOLECYSTECTOMY N/A 03/20/2018   Procedure: LAPAROSCOPIC CHOLECYSTECTOMY;  Surgeon: Rodolph Romano, MD;  Location: [facility];  Service: General;  Laterality: N/A;   MASTECTOMY Right 1992   TUBAL LIGATION      Allergies  Allergen Reactions   Amoxicillin Swelling    Blisters.    Aspirin Other (See Comments)    Stomach ulcers.  Benadryl [Diphenhydramine Hcl] Other (See Comments)    Hyperactive.    Outpatient Encounter Medications as of 04/03/2024  Medication Sig   acetaminophen  (TYLENOL ) 325 MG tablet Take 2 tablets (650 mg total) by mouth every 6 (six) hours as needed for mild pain (pain score 1-3), fever or headache.   atorvastatin  (LIPITOR) 40 MG tablet Take 1 tablet (40 mg total) by mouth daily.   cholecalciferol (VITAMIN D) 1000 units tablet Take 2,000 Units by mouth daily.   clopidogrel  (PLAVIX ) 75 MG tablet Take 1 tablet (75 mg total) by mouth daily.   docusate sodium  (COLACE) 100 MG capsule Take 100 mg by mouth daily as needed for mild  constipation or moderate constipation.   escitalopram  (LEXAPRO ) 20 MG tablet Take 1 tablet (20 mg total) by mouth daily.   fluticasone  (FLONASE ) 50 MCG/ACT nasal spray Place 2 sprays into both nostrils daily.   hydrochlorothiazide  (HYDRODIURIL ) 25 MG tablet Take 25 mg by mouth daily.   KLOR-CON  M20 20 MEQ tablet Take 20 mEq by mouth in the morning and at bedtime.   meclizine  (ANTIVERT ) 25 MG tablet Take 25 mg by mouth 3 (three) times daily as needed. For dizziness   OXYGEN 2lpm every shift   polyethylene glycol (MIRALAX  / GLYCOLAX ) 17 g packet Take 17 g by mouth daily.   propranolol (INDERAL) 20 MG tablet Take 20 mg by mouth 2 (two) times daily.   vitamin B-12 (CYANOCOBALAMIN ) 500 MCG tablet Take 500 mcg by mouth daily.   [DISCONTINUED] diazepam  (VALIUM ) 2 MG tablet Q 12 hours PRN for 1 week then q day PRN for 1 day x 1 week (Patient not taking: Reported on 04/11/2024)   [DISCONTINUED] sertraline  (ZOLOFT ) 100 MG tablet Take 150 mg by mouth at bedtime. (Patient not taking: Reported on 04/11/2024)   No facility-administered encounter medications on file as of 04/03/2024.    Review of Systems:  Review of Systems  Health Maintenance  Topic Date Due   Pneumococcal Vaccine: 50+ Years (1 of 1 - PCV) 10/01/1985   Zoster Vaccines- Shingrix (1 of 2) 10/01/1985   DEXA SCAN  Never done   COVID-19 Vaccine (7 - 2024-25 season) 06/13/2023   Medicare Annual Wellness (AWV)  04/29/2024   INFLUENZA VACCINE  05/12/2024   DTaP/Tdap/Td (3 - Td or Tdap) 07/08/2025   Hepatitis B Vaccines  Aged Out   HPV VACCINES  Aged Out   Meningococcal B Vaccine  Aged Out    Physical Exam: There were no vitals filed for this visit. There is no height or weight on file to calculate BMI. Physical Exam Constitutional:      Appearance: Normal appearance.  Cardiovascular:     Rate and Rhythm: Normal rate and regular rhythm.     Pulses: Normal pulses.     Heart sounds: Normal heart sounds.  Pulmonary:     Effort:  Pulmonary effort is normal.  Abdominal:     General: Abdomen is flat. Bowel sounds are normal.     Palpations: Abdomen is soft.  Musculoskeletal:        General: No swelling or tenderness.  Skin:    General: Skin is warm and dry.  Neurological:     Mental Status: She is alert and oriented to person, place, and time.  Psychiatric:        Mood and Affect: Mood normal.    Physical Exam    Labs reviewed: Basic Metabolic Panel: Recent Labs    01/22/24 0540 01/23/24 0421 01/24/24 0210 01/26/24 9557  01/31/24 0000 02/17/24 0000 04/02/24 1424  NA 136 137 135  --  133* 137 138  K 2.8* 4.3 4.0  --  3.6 4.2 4.3  CL 96* 98 95*  --  92* 97* 103  CO2 32 31 32  --  33* 28* 27  GLUCOSE 130* 116* 114*  --   --   --  112*  BUN 21 21 21   --  25* 15 25*  CREATININE 0.73 0.71 0.66   < > 0.7 0.6 0.68  CALCIUM  7.7* 8.0* 8.1*  --  8.4* 8.6* 8.8*  MG 2.3  --   --   --   --   --   --   TSH  --   --   --   --   --  1.60  --    < > = values in this interval not displayed.   Liver Function Tests: Recent Labs    01/06/24 0436 01/19/24 0948 01/31/24 0000 04/02/24 1424  AST 16 19 12* 22  ALT 14 13 18 17   ALKPHOS 47 131* 183* 69  BILITOT 1.0 1.0  --  1.0  PROT 5.7* 6.6  --  6.4*  ALBUMIN 3.1* 3.5 3.5 3.5   No results for input(s): LIPASE, AMYLASE in the last 8760 hours. No results for input(s): AMMONIA in the last 8760 hours. CBC: Recent Labs    01/23/24 0421 01/24/24 0210 01/24/24 0210 01/31/24 0000 02/17/24 0000 03/13/24 0000 04/02/24 1536  WBC 5.3 4.8   < > 11.8 8.2 3.9 6.8  NEUTROABS 3.2 3.1  --  10,231.00 6,642.00  --   --   HGB 12.5 12.3  --  12.8 13.1 11.8* 13.7  HCT 38.6 37.9  --  39 41 37 43.2  MCV 96.3 95.0  --   --   --   --  95.2  PLT 223 223  --  202 195 162 169   < > = values in this interval not displayed.   Lipid Panel: Recent Labs    01/06/24 0436  CHOL 146  HDL 42  LDLCALC 86  TRIG 89  CHOLHDL 3.5   Lab Results  Component Value Date    HGBA1C 5.3 01/05/2024    Procedures since last visit: MR BRAIN WO CONTRAST Result Date: 04/02/2024 CLINICAL DATA:  Neuro deficit, concern for stroke. Speech changes, Word salad. EXAM: MRI HEAD WITHOUT CONTRAST TECHNIQUE: Multiplanar, multiecho pulse sequences of the brain and surrounding structures were obtained without intravenous contrast. COMPARISON:  MRI head 01/05/2024. FINDINGS: Brain: Examination is somewhat limited due to motion artifact. No evidence of acute infarct. T2/FLAIR hyperintensity in the periventricular and subcortical white matter again noted. No edema, mass effect, or midline shift. Extra-axial diffusion signal abnormality corresponding to known extra-axial mass over the right frontal lobe. Additional mass along the anterior falx appreciated on diffusion-weighted images. Mild parenchymal volume loss. Vascular: Normal flow voids. Skull and upper cervical spine: Sinuses/Orbits: Bilateral lens replacement. Paranasal sinuses relatively clear. Other: No mastoid effusion. IMPRESSION: Examination limited by motion. Within these limitations there is no acute intracranial abnormality. Similar chronic microvascular ischemic changes. Meningiomas over the right frontal lobe and anterior falx are somewhat obscured due to motion artifact but are similar in appearance on diffusion-weighted images. Electronically Signed   By: Donnice Mania M.D.   On: 04/02/2024 18:19   Results    Assessment/Plan Stroke Recent episode suggestive of a stroke, characterized by difficulty speaking and transient confusion, with no residual symptoms. The episode may  have been a transient ischemic attack (TIA) due to reduced cerebral blood flow from atherosclerosis. Managing risk factors is crucial to prevent future episodes. Aspirin and cholesterol medication are important for prevention, though not 100% effective in preventing stroke or myocardial infarction. - Review medications to ensure appropriate management of  stroke risk factors, including cholesterol medication and aspirin. - Discuss with her daughter to ensure alignment on care preferences and decisions. - Communicate with nursing staff regarding her condition and care plan.  Transient Ischemic Attack (TIA) Episode of TIA suspected due to symptoms of speech difficulty and confusion, likely related to atherosclerotic changes in cerebral vasculature. Managing risk factors is crucial to prevent future episodes. - Ensure continuation of cholesterol medication and aspirin to manage risk factors. - Monitor for any recurrent symptoms and avoid unnecessary hospital visits unless clinically indicated.  Hypercholesterolemia Chronic condition contributing to atherosclerosis and increased risk of cerebrovascular events. Management is crucial to prevent further TIAs or strokes. - Ensure adherence to cholesterol-lowering medication as part of stroke prevention strategy.  Goals of Care She desires to avoid being incapacitated and prefers to avoid hospital visits unless necessary, aligning with her daughter's wishes to keep her well. The plan is to ensure that her care preferences are communicated and respected. - Discuss with her daughter to ensure alignment on care preferences and decisions. - Communicate with nursing staff regarding her condition and care plan.    Labs/tests ordered:  * No order type specified * Next appt:  Visit date not found

## 2024-05-16 DIAGNOSIS — M810 Age-related osteoporosis without current pathological fracture: Secondary | ICD-10-CM | POA: Diagnosis not present

## 2024-05-23 ENCOUNTER — Encounter: Payer: Self-pay | Admitting: Nurse Practitioner

## 2024-05-23 ENCOUNTER — Non-Acute Institutional Stay (SKILLED_NURSING_FACILITY): Payer: Self-pay | Admitting: Nurse Practitioner

## 2024-05-23 DIAGNOSIS — Z0000 Encounter for general adult medical examination without abnormal findings: Secondary | ICD-10-CM | POA: Diagnosis not present

## 2024-05-23 NOTE — Patient Instructions (Signed)
  Lindsey Barnes , Thank you for taking time to come for your Medicare Wellness Visit. I appreciate your ongoing commitment to your health goals. Please review the following plan we discussed and let me know if I can assist you in the future.    Please have your daughter bring a copy of your vaccine records so we can update your chart.    This is a list of the screening recommended for you and due dates:  Health Maintenance  Topic Date Due   Zoster (Shingles) Vaccine (1 of 2) 10/01/1952   Pneumococcal Vaccine for age over 74 (1 of 1 - PCV) 10/01/1985   COVID-19 Vaccine (7 - 2024-25 season) 06/13/2023   Flu Shot  05/12/2024   Medicare Annual Wellness Visit  05/23/2025   DTaP/Tdap/Td vaccine (3 - Td or Tdap) 07/08/2025   DEXA scan (bone density measurement)  Completed   Hepatitis B Vaccine  Aged Out   HPV Vaccine  Aged Out   Meningitis B Vaccine  Aged Out

## 2024-05-23 NOTE — Progress Notes (Signed)
 Subjective:   Lindsey Barnes is a 89 y.o. female who presents for Medicare Annual (Subsequent) preventive examination.  Visit Complete: In person [facility]   Cardiac Risk Factors include: advanced age (>8men, >42 women);hypertension;sedentary lifestyle     Objective:    Today's Vitals   05/23/24 1505  BP: 116/73  Pulse: 71  Resp: 17  Temp: (!) 97.4 F (36.3 C)  SpO2: 95%  Weight: 184 lb (83.5 kg)  Height: 5' 8 (1.727 m)   Body mass index is 27.98 kg/m.     05/23/2024    3:11 PM 04/02/2024    3:24 PM 02/29/2024    9:42 AM 01/19/2024    8:00 PM 01/19/2024    9:41 AM 01/05/2024    9:00 AM 01/04/2024   11:39 PM  Advanced Directives  Does Patient Have a Medical Advance Directive? Yes Yes Yes Yes Yes No Unable to assess, patient is non-responsive or altered mental status  Type of Advance Directive Out of facility DNR (pink MOST or yellow form) Out of facility DNR (pink MOST or yellow form) Out of facility DNR (pink MOST or yellow form) Out of facility DNR (pink MOST or yellow form) Healthcare Power of Attorney    Does patient want to make changes to medical advance directive? No - Patient declined  No - Patient declined No - Patient declined     Copy of Healthcare Power of Attorney in Chart?    No - copy requested No - copy requested    Would patient like information on creating a medical advance directive?      No - Patient declined     Current Medications (verified) Outpatient Encounter Medications as of 05/23/2024  Medication Sig   acetaminophen  (TYLENOL ) 325 MG tablet Take 2 tablets (650 mg total) by mouth every 6 (six) hours as needed for mild pain (pain score 1-3), fever or headache.   atorvastatin  (LIPITOR) 40 MG tablet Take 1 tablet (40 mg total) by mouth daily.   Baclofen 5 MG TABS Take 5 mg by mouth every 8 (eight) hours as needed.   cholecalciferol (VITAMIN D) 1000 units tablet Take 2,000 Units by mouth daily.   clopidogrel  (PLAVIX ) 75 MG tablet Take 1  tablet (75 mg total) by mouth daily.   denosumab  (PROLIA ) 60 MG/ML SOSY injection Inject 60 mg into the skin every 6 (six) months.   docusate sodium  (COLACE) 100 MG capsule Take 100 mg by mouth daily as needed for mild constipation or moderate constipation.   escitalopram  (LEXAPRO ) 20 MG tablet Take 1 tablet (20 mg total) by mouth daily.   fluticasone  (FLONASE ) 50 MCG/ACT nasal spray Place 2 sprays into both nostrils daily.   hydrochlorothiazide  (HYDRODIURIL ) 25 MG tablet Take 25 mg by mouth daily.   hydroxypropyl methylcellulose / hypromellose (ISOPTO TEARS / GONIOVISC) 2.5 % ophthalmic solution Place 1 drop into both eyes every 6 (six) hours as needed for dry eyes.   KLOR-CON  M20 20 MEQ tablet Take 20 mEq by mouth in the morning and at bedtime.   meclizine  (ANTIVERT ) 25 MG tablet Take 25 mg by mouth 3 (three) times daily as needed. For dizziness   melatonin 1 MG TABS tablet Take 1 mg by mouth at bedtime.   polyethylene glycol (MIRALAX  / GLYCOLAX ) 17 g packet Take 17 g by mouth daily.   propranolol (INDERAL) 20 MG tablet Take 20 mg by mouth 2 (two) times daily.   vitamin B-12 (CYANOCOBALAMIN ) 500 MCG tablet Take 500 mcg by  mouth daily.   doxycycline (ADOXA) 100 MG tablet Take 100 mg by mouth 2 (two) times daily. (Patient not taking: Reported on 05/23/2024)   Magnesium Sulfate (EPSOM SALT) POWD by Does not apply route. Apply to bilateral feet foot soaks topically three times daily for bilateral foot soaks for right great toe pickness/soreness warm water/epsom salt. (Patient not taking: Reported on 05/23/2024)   OXYGEN 2lpm every shift (Patient not taking: Reported on 05/23/2024)   No facility-administered encounter medications on file as of 05/23/2024.    Allergies (verified) Amoxicillin, Aspirin, and Benadryl [diphenhydramine hcl]   History: Past Medical History:  Diagnosis Date   Anxiety    Breast cancer (HCC)    1990   Complication of anesthesia    Depression    Dyspnea    DOE   GERD  (gastroesophageal reflux disease)    Headache(784.0)    HOH (hard of hearing)    Hypertension    Lymphoma (HCC)    Pain    CHRONIC BACK   Pain    CHRONIC BACK   Pneumonia    PONV (postoperative nausea and vomiting)    Tremors of nervous system    HANDS OCCAS   Past Surgical History:  Procedure Laterality Date   APPENDECTOMY     BACK SURGERY     BREAST SURGERY     CATARACT EXTRACTION W/PHACO Left 01/26/2017   Procedure: CATARACT EXTRACTION PHACO AND INTRAOCULAR LENS PLACEMENT (IOC);  Surgeon: Elsie Carmine, MD;  Location: [facility] ORS;  Service: Ophthalmology;  Laterality: Left;  US  53.7 AP% 20.3 CDE 10.90 Fluid pack lot # [id number]   CATARACT EXTRACTION W/PHACO Right 02/16/2017   Procedure: CATARACT EXTRACTION PHACO AND INTRAOCULAR LENS PLACEMENT (IOC);  Surgeon: Carmine Elsie, MD;  Location: [facility] OR[facility];  Service: Ophthalmology;  Laterality: Right;  US  00:55 AP% 22.5 CDE 12.43 Fluid pack lot # [id number]   CESAREAN SECTION     CHOLECYSTECTOMY N/A 03/20/2018   Procedure: LAPAROSCOPIC CHOLECYSTECTOMY;  Surgeon: Rodolph Romano, MD;  Location: [facility] ORS;  Service: General;  Laterality: N/A;   MASTECTOMY Right 1992   TUBAL LIGATION     No family history on file. Social History   Socioeconomic History   Marital status: Widowed    Spouse name: Not on file   Number of children: Not on file   Years of education: Not on file   Highest education level: Not on file  Occupational History   Not on file  Tobacco Use   Smoking status: Never   Smokeless tobacco: Never  Substance and Sexual Activity   Alcohol  use: No   Drug use: Yes    Types: Hydrocodone , Benzodiazepines   Sexual activity: Never  Other Topics Concern   Not on file  Social History Narrative   Not on file   Social Drivers of Health   Financial Resource Strain: Patient Declined (04/30/2023)   Received from [facility]   Overall Financial Resource Strain (CARDIA)    Difficulty of Paying  Living Expenses: Patient declined  Food Insecurity: No Food Insecurity (01/19/2024)   Hunger Vital Sign    Worried About Running Out of Food in the Last Year: Never true    Ran Out of Food in the Last Year: Never true  Transportation Needs: No Transportation Needs (01/19/2024)   PRAPARE - Administrator, Civil Service (Medical): No    Lack of Transportation (Non-Medical): No  Physical Activity: Not on file  Stress: Not on file  Social Connections: Socially  Isolated (01/19/2024)   Social Connection and Isolation Panel    Frequency of Communication with Friends and Family: Once a week    Frequency of Social Gatherings with Friends and Family: Once a week    Attends Religious Services: 1 to 4 times per year    Active Member of [employer] or Organizations: No    Attends Banker Meetings: Never    Marital Status: Widowed    Tobacco Counseling Counseling given: Not Answered   Clinical Intake:  Pre-visit preparation completed: Yes  Pain : No/denies pain     BMI - recorded: 27 Nutritional Status: BMI 25 -29 Overweight Nutritional Risks: None  How often do you need to have someone help you when you read instructions, pamphlets, or other written materials from your doctor or pharmacy?: 5 - Always (daughter does this for her)         Activities of Daily Living    05/23/2024    3:14 PM 01/19/2024    8:00 PM  In your present state of health, do you have any difficulty performing the following activities:  Hearing? 1 1  Comment hearing aides   Vision? 1 0  Difficulty concentrating or making decisions? 1 0  Walking or climbing stairs? 1   Comment unable to climb stairs   Dressing or bathing? 1   Doing errands, shopping? 1 0  Preparing Food and eating ? Y   Comment dooes not prepare food   Using the Toilet? N   In the past six months, have you accidently leaked urine? Y   Do you have problems with loss of bowel control? N   Managing your Medications? Y    Managing your Finances? Y   Housekeeping or managing your Housekeeping? Y     Patient Care Team: Abdul Fine, MD as PCP - General (Family Medicine)  Indicate any recent Medical Services you may have received from other than [facility] providers in the past year (date may be approximate).     Assessment:   This is a routine wellness examination for [place].  Hearing/Vision screen No results found.   Goals Addressed   None    Depression Screen    05/23/2024    3:19 PM  PHQ 2/9 Scores  PHQ - 2 Score 1    Fall Risk    05/23/2024    3:18 PM  Fall Risk   Falls in the past year? 1  Number falls in past yr: 0  Injury with Fall? 1  Risk for fall due to : History of fall(s);Impaired balance/gait;Impaired mobility  Follow up Falls evaluation completed    MEDICARE RISK AT HOME: Medicare Risk at Home Any stairs in or around the home?: No Home free of loose throw rugs in walkways, pet beds, electrical cords, etc?: Yes Adequate lighting in your home to reduce risk of falls?: Yes Life alert?: No Use of a cane, walker or w/c?: Yes Grab bars in the bathroom?: Yes Shower chair or bench in shower?: Yes Elevated toilet seat or a handicapped toilet?: Yes  TIMED UP AND GO:  Was the test performed?  No    Cognitive Function:        Immunizations Immunization History  Administered Date(s) Administered    sv, Bivalent, Protein Subunit Rsvpref,pf [place]) 08/02/2023   Influenza, High Dose Seasonal PF 07/14/2017, 07/26/2023   PFIZER Comirnaty(Gray Top)Covid-19 Tri-Sucrose Vaccine 10/19/2019, 11/09/2019, 10/17/2020, 04/16/2021   PFIZER(Purple Top)SARS-COV-2 Vaccination 10/19/2019, 11/09/2019   Pneumococcal-Unspecified 04/10/2000   Tdap 03/29/2014,  07/09/2015   Zoster, Unspecified 04/16/2021    TDAP status: Up to date  Flu Vaccine status: Due, Education has been provided regarding the importance of this vaccine. Advised may receive this vaccine at local pharmacy or Health  Dept. Aware to provide a copy of the vaccination record if obtained from local pharmacy or Health Dept. Verbalized acceptance and understanding.  Pneumococcal vaccine status: Up to date  Covid-19 vaccine status: Information provided on how to obtain vaccines.   Qualifies for Shingles Vaccine? Yes   Zostavax completed No   Shingrix Completed?: Yes  Screening Tests Health Maintenance  Topic Date Due   Zoster Vaccines- Shingrix (1 of 2) 10/01/1952   Pneumococcal Vaccine: 50+ Years (1 of 1 - PCV) 10/01/1985   COVID-19 Vaccine (7 - 2024-25 season) 06/13/2023   INFLUENZA VACCINE  05/12/2024   Medicare Annual Wellness (AWV)  05/23/2025   DTaP/Tdap/Td (3 - Td or Tdap) 07/08/2025   DEXA SCAN  Completed   Hepatitis B Vaccines  Aged Out   HPV VACCINES  Aged Out   Meningococcal B Vaccine  Aged Out    Health Maintenance  Health Maintenance Due  Topic Date Due   Zoster Vaccines- Shingrix (1 of 2) 10/01/1952   Pneumococcal Vaccine: 50+ Years (1 of 1 - PCV) 10/01/1985   COVID-19 Vaccine (7 - 2024-25 season) 06/13/2023   INFLUENZA VACCINE  05/12/2024    Colorectal cancer screening: No longer required.   Mammogram status: No longer required due to aged out.  Bone Density status: Completed 05/25/2023. Results reflect: Bone density results: OSTEOPOROSIS. Repeat every 2 years.  Lung Cancer Screening: (Low Dose CT Chest recommended if Age 37-80 years, 20 pack-year currently smoking OR have quit w/in 15years.) does not qualify.   Lung Cancer Screening Referral: na  Additional Screening:  Hepatitis C Screening: does not qualify; Completed na  Vision Screening: Recommended annual ophthalmology exams for early detection of glaucoma and other disorders of the eye. Is the patient up to date with their annual eye exam?  Yes  Who is the provider or what is the name of the office in which the patient attends annual eye exams? [facility]  If pt is not established with a provider, would they like  to be referred to a provider to establish care? No .   Dental Screening: Recommended annual dental exams for proper oral hygiene   Community Resource Referral / Chronic Care Management: CRR required this visit?  No   CCM required this visit?  No     Plan:     I have personally reviewed and noted the following in the patient's chart:   Medical and social history Use of alcohol , tobacco or illicit drugs  Current medications and supplements including opioid prescriptions. Patient is not currently taking opioid prescriptions. Functional ability and status Nutritional status Physical activity Advanced directives List of other physicians Hospitalizations, surgeries, and ER visits in previous 12 months Vitals Screenings to include cognitive, depression, and falls Referrals and appointments  In addition, I have reviewed and discussed with patient certain preventive protocols, quality metrics, and best practice recommendations. A written personalized care plan for preventive services as well as general preventive health recommendations were provided to patient.     Harlene MARLA An, NP   05/23/2024

## 2024-06-02 DIAGNOSIS — M2042 Other hammer toe(s) (acquired), left foot: Secondary | ICD-10-CM | POA: Diagnosis not present

## 2024-06-02 DIAGNOSIS — B351 Tinea unguium: Secondary | ICD-10-CM | POA: Diagnosis not present

## 2024-06-02 DIAGNOSIS — I7091 Generalized atherosclerosis: Secondary | ICD-10-CM | POA: Diagnosis not present

## 2024-06-02 DIAGNOSIS — M2041 Other hammer toe(s) (acquired), right foot: Secondary | ICD-10-CM | POA: Diagnosis not present

## 2024-07-04 ENCOUNTER — Non-Acute Institutional Stay (SKILLED_NURSING_FACILITY): Payer: Self-pay | Admitting: Nurse Practitioner

## 2024-07-04 ENCOUNTER — Encounter: Payer: Self-pay | Admitting: Nurse Practitioner

## 2024-07-04 DIAGNOSIS — K219 Gastro-esophageal reflux disease without esophagitis: Secondary | ICD-10-CM

## 2024-07-04 DIAGNOSIS — F32A Depression, unspecified: Secondary | ICD-10-CM

## 2024-07-04 DIAGNOSIS — G8929 Other chronic pain: Secondary | ICD-10-CM | POA: Diagnosis not present

## 2024-07-04 DIAGNOSIS — I10 Essential (primary) hypertension: Secondary | ICD-10-CM | POA: Diagnosis not present

## 2024-07-04 DIAGNOSIS — R350 Frequency of micturition: Secondary | ICD-10-CM | POA: Diagnosis not present

## 2024-07-04 DIAGNOSIS — K5900 Constipation, unspecified: Secondary | ICD-10-CM | POA: Diagnosis not present

## 2024-07-04 DIAGNOSIS — E663 Overweight: Secondary | ICD-10-CM | POA: Diagnosis not present

## 2024-07-04 DIAGNOSIS — M545 Low back pain, unspecified: Secondary | ICD-10-CM

## 2024-07-04 DIAGNOSIS — M810 Age-related osteoporosis without current pathological fracture: Secondary | ICD-10-CM | POA: Diagnosis not present

## 2024-07-04 NOTE — Progress Notes (Signed)
 Location:  [facility].  Nursing Home Room Number: [facility] [id number] Place of Service:  SNF [phone]) Harlene An, NP  PCP: Abdul Fine, MD  Patient Care Team: Abdul Fine, MD as PCP - General [facility] Medicine)  Extended Emergency Contact Information Primary Emergency Contact: [facility] Address: [address]          [place], [place] [address] Home Phone: [phone] Relation: Daughter  Goals of care: Advanced Directive information    05/23/2024    3:11 PM  Advanced Directives  Does Patient Have a Medical Advance Directive? Yes  Type of Advance Directive Out of facility DNR (pink MOST or yellow form)  Does patient want to make changes to medical advance directive? No - Patient declined     Chief Complaint  Patient presents with   Medical Management of Chronic Issues    Medical Management of Chronic Issues.     HPI: Lindsey 89 year old Barnes is seen for chronic disease management. She was sitting in a recliner in her room, having eaten lunch and reporting normal appetite and eating patterns. She denies any swallowing difficulties and says bowel movements are regular--about two soft BMs per day--but she dislikes taking Miralax . She ambulates with a walker and does not eat in the main dining hall due to hand tremors.  She urinates well without incontinence, though she reports waking 4-5 times nightly to urinate. Her fluid intake is low (about 3-4 cups daily), due to a dislike of water's taste.  She has a chronic mole below the left eye, which has been growing and bleeding intermittently over the past year. She saw dermatology for possible removal in the past but did not proceed; the lesion does not bother or hurt her, and she does not wish to follow up with dermatology further. She had seen her eye doctor last year.  She recently gained 10 lbs (since July) and would like to start a diet. She denies shortness of breath, but mentions occasional dizziness and past  vertigo (managed with medication). She exercises by using Lindsey elliptical 10 min/day and walking long hallways three times each day.   There is no current pain, but she does have lower back pain at night and has a history of multiple back surgeries.  She has a daughter who lives close by.  Past Medical History:  Diagnosis Date   Anxiety    Breast cancer (HCC)    1990   Complication of anesthesia    Depression    Dyspnea    DOE   GERD (gastroesophageal reflux disease)    Headache(784.0)    HOH (hard of hearing)    Hypertension    Lymphoma (HCC)    Pain    CHRONIC BACK   Pain    CHRONIC BACK   Pneumonia    PONV (postoperative nausea and vomiting)    Tremors of nervous system    HANDS OCCAS   Past Surgical History:  Procedure Laterality Date   APPENDECTOMY     BACK SURGERY     BREAST SURGERY     CATARACT EXTRACTION W/PHACO Left 01/26/2017   Procedure: CATARACT EXTRACTION PHACO AND INTRAOCULAR LENS PLACEMENT (IOC);  Surgeon: Elsie Carmine, MD;  Location: [facility] ORS;  Service: Ophthalmology;  Laterality: Left;  US  53.7 AP% 20.3 CDE 10.90 Fluid pack lot # [id number]   CATARACT EXTRACTION W/PHACO Right 02/16/2017   Procedure: CATARACT EXTRACTION PHACO AND INTRAOCULAR LENS PLACEMENT (IOC);  Surgeon: Carmine Elsie, MD;  Location: [facility] ORS;  Service: Ophthalmology;  Laterality: Right;  US  00:55 AP% 22.5 CDE 12.43 Fluid pack lot # [id number]   CESAREAN SECTION     CHOLECYSTECTOMY N/A 03/20/2018   Procedure: LAPAROSCOPIC CHOLECYSTECTOMY;  Surgeon: Rodolph Romano, MD;  Location: [facility] ORS;  Service: General;  Laterality: N/A;   MASTECTOMY Right 1992   TUBAL LIGATION      Allergies  Allergen Reactions   Amoxicillin Swelling    Blisters.    Aspirin Other (See Comments)    Stomach ulcers.   Benadryl [Diphenhydramine Hcl] Other (See Comments)    Hyperactive.    Outpatient Encounter Medications as of 07/04/2024  Medication Sig   acetaminophen  (TYLENOL ) 325 MG tablet Take  2 tablets (650 mg total) by mouth every 6 (six) hours as needed for mild pain (pain score 1-3), fever or headache.   acetaminophen  (TYLENOL ) 325 MG tablet Take 650 mg by mouth at bedtime.   atorvastatin  (LIPITOR) 40 MG tablet Take 1 tablet (40 mg total) by mouth daily.   Baclofen 5 MG TABS Take 5 mg by mouth every 8 (eight) hours as needed.   calcium  carbonate (OSCAL) 1500 (600 Ca) MG TABS tablet Take 600 mg by mouth 2 (two) times daily with a meal.   cholecalciferol (VITAMIN D) 1000 units tablet Take 2,000 Units by mouth daily.   clopidogrel  (PLAVIX ) 75 MG tablet Take 1 tablet (75 mg total) by mouth daily.   denosumab  (PROLIA ) 60 MG/ML SOSY injection Inject 60 mg into the skin every 6 (six) months.   docusate sodium  (COLACE) 100 MG capsule Take 100 mg by mouth daily as needed for mild constipation or moderate constipation.   escitalopram  (LEXAPRO ) 20 MG tablet Take 1 tablet (20 mg total) by mouth daily.   fluticasone  (FLONASE ) 50 MCG/ACT nasal spray Place 2 sprays into both nostrils daily.   hydrochlorothiazide  (HYDRODIURIL ) 25 MG tablet Take 25 mg by mouth daily.   hydroxypropyl methylcellulose / hypromellose (ISOPTO TEARS / GONIOVISC) 2.5 % ophthalmic solution Place 1 drop into both eyes every 6 (six) hours as needed for dry eyes.   KLOR-CON  M20 20 MEQ tablet Take 20 mEq by mouth in the morning and at bedtime.   meclizine  (ANTIVERT ) 25 MG tablet Take 25 mg by mouth 3 (three) times daily as needed. For dizziness   melatonin 1 MG TABS tablet Take 1 mg by mouth at bedtime.   polyethylene glycol (MIRALAX  / GLYCOLAX ) 17 g packet Take 17 g by mouth daily.   propranolol (INDERAL) 20 MG tablet Take 20 mg by mouth 2 (two) times daily.   vitamin B-12 (CYANOCOBALAMIN ) 500 MCG tablet Take 500 mcg by mouth daily.   doxycycline (ADOXA) 100 MG tablet Take 100 mg by mouth 2 (two) times daily. (Patient not taking: Reported on 07/04/2024)   Magnesium Sulfate (EPSOM SALT) POWD by Does not apply route. Apply to  bilateral feet foot soaks topically three times daily for bilateral foot soaks for right great toe pickness/soreness warm water/epsom salt. (Patient not taking: Reported on 07/04/2024)   OXYGEN 2lpm every shift (Patient not taking: Reported on 07/04/2024)   No facility-administered encounter medications on file as of 07/04/2024.   Review of Systems  Constitutional:  Negative for activity change, appetite change and unexpected weight change.  HENT: Negative.    Respiratory: Negative.    Cardiovascular: Negative.   Gastrointestinal: Negative.   Genitourinary:  Positive for frequency and urgency.  Musculoskeletal:  Positive for back pain.  Skin: Negative.   Neurological:  Positive for dizziness and tremors.  Psychiatric/Behavioral:  Negative.     Immunization History  Administered Date(s) Administered    sv, Bivalent, Protein Subunit Rsvpref,pf (Abrysvo) 08/02/2023   INFLUENZA, HIGH DOSE SEASONAL PF 07/14/2017, 07/26/2023   PFIZER Comirnaty(Gray Top)Covid-19 Tri-Sucrose Vaccine 10/19/2019, 11/09/2019, 10/17/2020, 04/16/2021   PFIZER(Purple Top)SARS-COV-2 Vaccination 10/19/2019, 11/09/2019   Pneumococcal-Unspecified 04/10/2000   Tdap 03/29/2014, 07/09/2015   Zoster, Unspecified 04/16/2021   Pertinent  Health Maintenance Due  Topic Date Due   Influenza Vaccine  05/12/2024   DEXA SCAN  Completed      01/17/2021   10:00 PM 01/18/2021   12:00 PM 01/18/2021    8:30 PM 01/19/2021    9:00 AM 05/23/2024    3:18 PM  Fall Risk  Falls in the past year?     1  Was there Lindsey injury with Fall?     1  Fall Risk Category Calculator     2  (RETIRED) Patient Fall Risk Level High fall risk  High fall risk  High fall risk  High fall risk    Patient at Risk for Falls Due to     History of fall(s);Impaired balance/gait;Impaired mobility  Fall risk Follow up     Falls evaluation completed     Data saved with a previous flowsheet row definition   Functional Status Survey:    Vitals:   07/04/24 0854  BP:  126/73  Pulse: 69  Resp: 16  Temp: 97.6 F (36.4 C)  SpO2: 97%  Weight: 187 lb 3.2 oz (84.9 kg)  Height: 5' 8 (1.727 m)   Body mass index is 28.46 kg/m. Physical Exam Vitals reviewed.  Constitutional:      Appearance: Normal appearance.  HENT:     Head: Normocephalic and atraumatic.     Nose: Nose normal.  Eyes:     Conjunctiva/sclera: Conjunctivae normal.  Cardiovascular:     Rate and Rhythm: Normal rate.     Pulses: Normal pulses.  Pulmonary:     Effort: Pulmonary effort is normal.  Abdominal:     Palpations: Abdomen is soft.  Musculoskeletal:        General: Normal range of motion.  Skin:    General: Skin is warm and dry.         Comments: Large mole under left eye (left upper cheek), non tender   Neurological:     Mental Status: She is alert and oriented to person, place, and time. Mental status is at baseline.  Psychiatric:        Mood and Affect: Mood normal.        Behavior: Behavior normal.    Labs reviewed: Recent Labs    01/22/24 0540 01/23/24 0421 01/24/24 0210 01/26/24 0442 01/31/24 0000 02/17/24 0000 04/02/24 1424  NA 136 137 135  --  133* 137 138  K 2.8* 4.3 4.0  --  3.6 4.2 4.3  CL 96* 98 95*  --  92* 97* 103  CO2 32 31 32  --  33* 28* 27  GLUCOSE 130* 116* 114*  --   --   --  112*  BUN 21 21 21   --  25* 15 25*  CREATININE 0.73 0.71 0.66   < > 0.7 0.6 0.68  CALCIUM  7.7* 8.0* 8.1*  --  8.4* 8.6* 8.8*  MG 2.3  --   --   --   --   --   --    < > = values in this interval not displayed.   Recent Labs    01/06/24  9563 01/19/24 0948 01/31/24 0000 04/02/24 1424  AST 16 19 12* 22  ALT 14 13 18 17   ALKPHOS 47 131* 183* 69  BILITOT 1.0 1.0  --  1.0  PROT 5.7* 6.6  --  6.4*  ALBUMIN 3.1* 3.5 3.5 3.5   Recent Labs    01/23/24 0421 01/24/24 0210 01/24/24 0210 01/31/24 0000 02/17/24 0000 03/13/24 0000 04/02/24 1536  WBC 5.3 4.8   < > 11.8 8.2 3.9 6.8  NEUTROABS 3.2 3.1  --  10,231.00 6,642.00  --   --   HGB 12.5 12.3  --  12.8  13.1 11.8* 13.7  HCT 38.6 37.9  --  39 41 37 43.2  MCV 96.3 95.0  --   --   --   --  95.2  PLT 223 223  --  202 195 162 169   < > = values in this interval not displayed.   Lab Results  Component Value Date   TSH 1.60 02/17/2024   Lab Results  Component Value Date   HGBA1C 5.3 01/05/2024   Lab Results  Component Value Date   CHOL 146 01/06/2024   HDL 42 01/06/2024   LDLCALC 86 01/06/2024   TRIG 89 01/06/2024   CHOLHDL 3.5 01/06/2024    Significant Diagnostic Results in last 30 days:  No results found.  Assessment/Plan  1. Primary hypertension (Primary) - Blood pressure remains stable. - CMP last checked June 2025, will follow.  - Continue hydrochlorothiazide  and propranolol as prescribed.  2. Gastroesophageal reflux disease without esophagitis - Chronic; no acute complaints. - No current medication needed. -continue dietary modifications  3. Overweight (BMI 25.0-29.9) - Gained 10 lbs from July to September 2025 since she has been in facility.  -eating well at this time. Will monitor weight. - Reinforced importance of regular physical activity and healthy eating.  4. Senile osteoporosis - Continue Prolia  injections through endo -maintain vigilance to prevent falls. - Continue calcium  and vitamin D supplementation as directed.  5. Depression, controlled - Stable; no current complaints. - Continue Lexapro  as prescribed.  6. Urinary frequency - Chronic; denies incontinence. - Discussed minimizing fluid intake in the evening and more in the morning  7. Chronic bilateral low back pain without sciatica - Pain is chronic, typically occurring at night when lying down. - Reviewed proper sleep positioning and use of pillow under knees. - Continue Tylenol  at bedtime as needed.  8. Constipation, unspecified constipation type - Resolved; stools now soft. - Continue Miralax  and Colace for maintenance.  Irfat Avie, AGPCNP Student - [facility] I personally was  present during the history, physical exam and medical decision-making activities of this service and have verified that the service and findings are accurately documented in the student's note Tyvon Eggenberger K. Caro BODILY [facility] [phone]

## 2024-07-04 NOTE — Progress Notes (Deleted)
 Location:  [facility].  Nursing Home Room Number: [facility] [id number] Place of Service:  SNF [phone]) Harlene An, NP  PCP: Abdul Fine, MD  Patient Care Team: Abdul Fine, MD as PCP - General [facility] Medicine)  Extended Emergency Contact Information Primary Emergency Contact: [facility] Address: [address]          [place], [place] [address] Home Phone: [phone] Relation: Daughter  Goals of care: Advanced Directive information    05/23/2024    3:11 PM  Advanced Directives  Does Patient Have a Medical Advance Directive? Yes  Type of Advance Directive Out of facility DNR (pink MOST or yellow form)  Does patient want to make changes to medical advance directive? No - Patient declined     Chief Complaint  Patient presents with   Medical Management of Chronic Issues    Medical Management of Chronic Issues.     HPI:  Pt is a 89 y.o. female seen today for medical management of chronic disease.    Past Medical History:  Diagnosis Date   Anxiety    Breast cancer (HCC)    1990   Complication of anesthesia    Depression    Dyspnea    DOE   GERD (gastroesophageal reflux disease)    Headache(784.0)    HOH (hard of hearing)    Hypertension    Lymphoma (HCC)    Pain    CHRONIC BACK   Pain    CHRONIC BACK   Pneumonia    PONV (postoperative nausea and vomiting)    Tremors of nervous system    HANDS OCCAS   Past Surgical History:  Procedure Laterality Date   APPENDECTOMY     BACK SURGERY     BREAST SURGERY     CATARACT EXTRACTION W/PHACO Left 01/26/2017   Procedure: CATARACT EXTRACTION PHACO AND INTRAOCULAR LENS PLACEMENT (IOC);  Surgeon: Elsie Carmine, MD;  Location: [facility] ORS;  Service: Ophthalmology;  Laterality: Left;  US  53.7 AP% 20.3 CDE 10.90 Fluid pack lot # [id number]   CATARACT EXTRACTION W/PHACO Right 02/16/2017   Procedure: CATARACT EXTRACTION PHACO AND INTRAOCULAR LENS PLACEMENT (IOC);  Surgeon: Carmine Elsie, MD;  Location:  [facility] ORS;  Service: Ophthalmology;  Laterality: Right;  US  00:55 AP% 22.5 CDE 12.43 Fluid pack lot # [id number]   CESAREAN SECTION     CHOLECYSTECTOMY N/A 03/20/2018   Procedure: LAPAROSCOPIC CHOLECYSTECTOMY;  Surgeon: Rodolph Romano, MD;  Location: [facility] ORS;  Service: General;  Laterality: N/A;   MASTECTOMY Right 1992   TUBAL LIGATION      Allergies  Allergen Reactions   Amoxicillin Swelling    Blisters.    Aspirin Other (See Comments)    Stomach ulcers.   Benadryl [Diphenhydramine Hcl] Other (See Comments)    Hyperactive.    Outpatient Encounter Medications as of 07/04/2024  Medication Sig   acetaminophen  (TYLENOL ) 325 MG tablet Take 2 tablets (650 mg total) by mouth every 6 (six) hours as needed for mild pain (pain score 1-3), fever or headache.   acetaminophen  (TYLENOL ) 325 MG tablet Take 650 mg by mouth at bedtime.   atorvastatin  (LIPITOR) 40 MG tablet Take 1 tablet (40 mg total) by mouth daily.   Baclofen 5 MG TABS Take 5 mg by mouth every 8 (eight) hours as needed.   calcium  carbonate (OSCAL) 1500 (600 Ca) MG TABS tablet Take 600 mg by mouth 2 (two) times daily with a meal.   cholecalciferol (VITAMIN D) 1000 units tablet Take 2,000 Units by  mouth daily.   clopidogrel  (PLAVIX ) 75 MG tablet Take 1 tablet (75 mg total) by mouth daily.   denosumab  (PROLIA ) 60 MG/ML SOSY injection Inject 60 mg into the skin every 6 (six) months.   docusate sodium  (COLACE) 100 MG capsule Take 100 mg by mouth daily as needed for mild constipation or moderate constipation.   escitalopram  (LEXAPRO ) 20 MG tablet Take 1 tablet (20 mg total) by mouth daily.   fluticasone  (FLONASE ) 50 MCG/ACT nasal spray Place 2 sprays into both nostrils daily.   hydrochlorothiazide  (HYDRODIURIL ) 25 MG tablet Take 25 mg by mouth daily.   hydroxypropyl methylcellulose / hypromellose (ISOPTO TEARS / GONIOVISC) 2.5 % ophthalmic solution Place 1 drop into both eyes every 6 (six) hours as needed for dry eyes.   KLOR-CON   M20 20 MEQ tablet Take 20 mEq by mouth in the morning and at bedtime.   meclizine  (ANTIVERT ) 25 MG tablet Take 25 mg by mouth 3 (three) times daily as needed. For dizziness   melatonin 1 MG TABS tablet Take 1 mg by mouth at bedtime.   polyethylene glycol (MIRALAX  / GLYCOLAX ) 17 g packet Take 17 g by mouth daily.   propranolol (INDERAL) 20 MG tablet Take 20 mg by mouth 2 (two) times daily.   vitamin B-12 (CYANOCOBALAMIN ) 500 MCG tablet Take 500 mcg by mouth daily.   doxycycline (ADOXA) 100 MG tablet Take 100 mg by mouth 2 (two) times daily. (Patient not taking: Reported on 07/04/2024)   Magnesium Sulfate (EPSOM SALT) POWD by Does not apply route. Apply to bilateral feet foot soaks topically three times daily for bilateral foot soaks for right great toe pickness/soreness warm water/epsom salt. (Patient not taking: Reported on 07/04/2024)   OXYGEN 2lpm every shift (Patient not taking: Reported on 07/04/2024)   No facility-administered encounter medications on file as of 07/04/2024.    Review of Systems ***  Immunization History  Administered Date(s) Administered    sv, Bivalent, Protein Subunit Rsvpref,pf (Abrysvo) 08/02/2023   INFLUENZA, HIGH DOSE SEASONAL PF 07/14/2017, 07/26/2023   PFIZER Comirnaty(Gray Top)Covid-19 Tri-Sucrose Vaccine 10/19/2019, 11/09/2019, 10/17/2020, 04/16/2021   PFIZER(Purple Top)SARS-COV-2 Vaccination 10/19/2019, 11/09/2019   Pneumococcal-Unspecified 04/10/2000   Tdap 03/29/2014, 07/09/2015   Zoster, Unspecified 04/16/2021   Pertinent  Health Maintenance Due  Topic Date Due   Influenza Vaccine  05/12/2024   DEXA SCAN  Completed      01/17/2021   10:00 PM 01/18/2021   12:00 PM 01/18/2021    8:30 PM 01/19/2021    9:00 AM 05/23/2024    3:18 PM  Fall Risk  Falls in the past year?     1  Was there an injury with Fall?     1  Fall Risk Category Calculator     2  (RETIRED) Patient Fall Risk Level High fall risk  High fall risk  High fall risk  High fall risk    Patient  at Risk for Falls Due to     History of fall(s);Impaired balance/gait;Impaired mobility  Fall risk Follow up     Falls evaluation completed     Data saved with a previous flowsheet row definition   Functional Status Survey:    Vitals:   07/04/24 0854  BP: 126/73  Pulse: 69  Resp: 16  Temp: 97.6 F (36.4 C)  SpO2: 97%  Weight: 187 lb 3.2 oz (84.9 kg)  Height: 5' 8 (1.727 m)   Body mass index is 28.46 kg/m. Physical Exam***  Labs reviewed: Recent Labs  01/22/24 0540 01/23/24 0421 01/24/24 0210 01/26/24 0442 01/31/24 0000 02/17/24 0000 04/02/24 1424  NA 136 137 135  --  133* 137 138  K 2.8* 4.3 4.0  --  3.6 4.2 4.3  CL 96* 98 95*  --  92* 97* 103  CO2 32 31 32  --  33* 28* 27  GLUCOSE 130* 116* 114*  --   --   --  112*  BUN 21 21 21   --  25* 15 25*  CREATININE 0.73 0.71 0.66   < > 0.7 0.6 0.68  CALCIUM  7.7* 8.0* 8.1*  --  8.4* 8.6* 8.8*  MG 2.3  --   --   --   --   --   --    < > = values in this interval not displayed.   Recent Labs    01/06/24 0436 01/19/24 0948 01/31/24 0000 04/02/24 1424  AST 16 19 12* 22  ALT 14 13 18 17   ALKPHOS 47 131* 183* 69  BILITOT 1.0 1.0  --  1.0  PROT 5.7* 6.6  --  6.4*  ALBUMIN 3.1* 3.5 3.5 3.5   Recent Labs    01/23/24 0421 01/24/24 0210 01/24/24 0210 01/31/24 0000 02/17/24 0000 03/13/24 0000 04/02/24 1536  WBC 5.3 4.8   < > 11.8 8.2 3.9 6.8  NEUTROABS 3.2 3.1  --  10,231.00 6,642.00  --   --   HGB 12.5 12.3  --  12.8 13.1 11.8* 13.7  HCT 38.6 37.9  --  39 41 37 43.2  MCV 96.3 95.0  --   --   --   --  95.2  PLT 223 223  --  202 195 162 169   < > = values in this interval not displayed.   Lab Results  Component Value Date   TSH 1.60 02/17/2024   Lab Results  Component Value Date   HGBA1C 5.3 01/05/2024   Lab Results  Component Value Date   CHOL 146 01/06/2024   HDL 42 01/06/2024   LDLCALC 86 01/06/2024   TRIG 89 01/06/2024   CHOLHDL 3.5 01/06/2024    Significant Diagnostic Results in last 30  days:  No results found.  Assessment/Plan No problem-specific Assessment & Plan notes found for this encounter.     Emryn Flanery K. Caro BODILY [facility] [phone]

## 2024-08-03 ENCOUNTER — Non-Acute Institutional Stay (SKILLED_NURSING_FACILITY): Payer: Self-pay | Admitting: Nurse Practitioner

## 2024-08-03 DIAGNOSIS — I10 Essential (primary) hypertension: Secondary | ICD-10-CM

## 2024-08-03 DIAGNOSIS — F32A Depression, unspecified: Secondary | ICD-10-CM | POA: Diagnosis not present

## 2024-08-03 DIAGNOSIS — I6930 Unspecified sequelae of cerebral infarction: Secondary | ICD-10-CM

## 2024-08-03 DIAGNOSIS — M810 Age-related osteoporosis without current pathological fracture: Secondary | ICD-10-CM

## 2024-08-03 DIAGNOSIS — F5101 Primary insomnia: Secondary | ICD-10-CM | POA: Diagnosis not present

## 2024-08-03 DIAGNOSIS — E7800 Pure hypercholesterolemia, unspecified: Secondary | ICD-10-CM | POA: Diagnosis not present

## 2024-08-03 DIAGNOSIS — G8929 Other chronic pain: Secondary | ICD-10-CM | POA: Diagnosis not present

## 2024-08-03 DIAGNOSIS — M545 Low back pain, unspecified: Secondary | ICD-10-CM | POA: Diagnosis not present

## 2024-08-03 DIAGNOSIS — K5900 Constipation, unspecified: Secondary | ICD-10-CM | POA: Diagnosis not present

## 2024-08-03 NOTE — Assessment & Plan Note (Signed)
 Blood pressure well controlled, goal bp <140/90 Continue current medications and dietary modifications follow metabolic panel

## 2024-08-03 NOTE — Assessment & Plan Note (Signed)
 Well controlled on lexapro  20 mg daily Denies anxiety or depression at this time.

## 2024-08-03 NOTE — Progress Notes (Signed)
 Location:  Other Nursing Home Room Number: [id number] Place of Service:  SNF ([id number])  Lindsey Locus, DO  Patient Care Team: Lindsey Locus, DO as PCP - General (Internal Medicine)  Extended Emergency Contact Information Primary Emergency Contact: Lindsey Barnes B Address: [address]          [place], [place] [address] Home Phone: [phone] Relation: Daughter  Goals of care: Advanced Directive information    05/23/2024    3:11 PM  Advanced Directives  Does Patient Have a Medical Advance Directive? Yes  Type of Advance Directive Out of facility DNR (pink MOST or yellow form)  Does patient want to make changes to medical advance directive? No - Patient declined     Chief Complaint  Patient presents with   Medical Management of Chronic Issues    Routine follow up     HPI:  Pt is a 89 y.o. female seen today for medical management of chronic disease. Pt reports she is doing okay.  Reports she is having trouble at night for sleep.  She is on melatonin and it is not helping.  She reports she has to roll around and that makes her back hurt more.  Reports mattress is very uncomfortable.   Chronic low back pain- ongoing to lower back- will have shooting pain. Tylenol  eases the pain. Sometimes back pain will wake her up.   She is on lexapro  but denies anxiety or depression- she will get to thinking about the fact that she is in LTC and not able to go home.  She loved her house and sad she is not able to go back there. She understands she can not go back home and take care of herself   She is followed by endocrine for her osteoporosis and remains on prolia    Hx of CVA- continues on plavix   She denies constipation or diarrhea.     Past Medical History:  Diagnosis Date   Anxiety    Breast cancer (HCC)    1990   Complication of anesthesia    Depression    Dyspnea    DOE   GERD (gastroesophageal reflux disease)    Headache(784.0)    HOH (hard of hearing)    Hypertension     Lymphoma (HCC)    Pain    CHRONIC BACK   Pain    CHRONIC BACK   Pneumonia    PONV (postoperative nausea and vomiting)    Tremors of nervous system    HANDS OCCAS   Past Surgical History:  Procedure Laterality Date   APPENDECTOMY     BACK SURGERY     BREAST SURGERY     CATARACT EXTRACTION W/PHACO Left 01/26/2017   Procedure: CATARACT EXTRACTION PHACO AND INTRAOCULAR LENS PLACEMENT (IOC);  Surgeon: Elsie Carmine, MD;  Location: [facility];  Service: Ophthalmology;  Laterality: Left;  US  53.7 AP% 20.3 CDE 10.90 Fluid pack lot # [id number]   CATARACT EXTRACTION W/PHACO Right 02/16/2017   Procedure: CATARACT EXTRACTION PHACO AND INTRAOCULAR LENS PLACEMENT (IOC);  Surgeon: Carmine Elsie, MD;  Location: [facility];  Service: Ophthalmology;  Laterality: Right;  US  00:55 AP% 22.5 CDE 12.43 Fluid pack lot # [id number]   CESAREAN SECTION     CHOLECYSTECTOMY N/A 03/20/2018   Procedure: LAPAROSCOPIC CHOLECYSTECTOMY;  Surgeon: Rodolph Romano, MD;  Location: [facility];  Service: General;  Laterality: N/A;   MASTECTOMY Right 1992   TUBAL LIGATION      Allergies  Allergen Reactions   Amoxicillin Swelling  Blisters.    Aspirin Other (See Comments)    Stomach ulcers.   Benadryl [Diphenhydramine Hcl] Other (See Comments)    Hyperactive.    Outpatient Encounter Medications as of 08/03/2024  Medication Sig   acetaminophen  (TYLENOL ) 325 MG tablet Take 2 tablets (650 mg total) by mouth every 6 (six) hours as needed for mild pain (pain score 1-3), fever or headache.   acetaminophen  (TYLENOL ) 325 MG tablet Take 650 mg by mouth at bedtime.   atorvastatin  (LIPITOR) 40 MG tablet Take 1 tablet (40 mg total) by mouth daily.   Baclofen 5 MG TABS Take 5 mg by mouth every 8 (eight) hours as needed.   calcium  carbonate (OSCAL) 1500 (600 Ca) MG TABS tablet Take 600 mg by mouth 2 (two) times daily with a meal.   cholecalciferol (VITAMIN D) 1000 units tablet Take 2,000 Units by mouth daily.    clopidogrel  (PLAVIX ) 75 MG tablet Take 1 tablet (75 mg total) by mouth daily.   denosumab  (PROLIA ) 60 MG/ML SOSY injection Inject 60 mg into the skin every 6 (six) months.   docusate sodium  (COLACE) 100 MG capsule Take 100 mg by mouth daily as needed for mild constipation or moderate constipation.   escitalopram  (LEXAPRO ) 20 MG tablet Take 1 tablet (20 mg total) by mouth daily.   fluticasone  (FLONASE ) 50 MCG/ACT nasal spray Place 2 sprays into both nostrils daily.   hydrochlorothiazide  (HYDRODIURIL ) 25 MG tablet Take 25 mg by mouth daily.   hydroxypropyl methylcellulose / hypromellose (ISOPTO TEARS / GONIOVISC) 2.5 % ophthalmic solution Place 1 drop into both eyes every 6 (six) hours as needed for dry eyes.   KLOR-CON  M20 20 MEQ tablet Take 20 mEq by mouth in the morning and at bedtime.   meclizine  (ANTIVERT ) 25 MG tablet Take 25 mg by mouth 3 (three) times daily as needed. For dizziness   melatonin 1 MG TABS tablet Take 1 mg by mouth at bedtime.   polyethylene glycol (MIRALAX  / GLYCOLAX ) 17 g packet Take 17 g by mouth daily.   propranolol (INDERAL) 20 MG tablet Take 20 mg by mouth 2 (two) times daily.   vitamin B-12 (CYANOCOBALAMIN ) 500 MCG tablet Take 500 mcg by mouth daily.   [DISCONTINUED] doxycycline (ADOXA) 100 MG tablet Take 100 mg by mouth 2 (two) times daily. (Patient not taking: Reported on 07/04/2024)   [DISCONTINUED] Magnesium Sulfate (EPSOM SALT) POWD by Does not apply route. Apply to bilateral feet foot soaks topically three times daily for bilateral foot soaks for right great toe pickness/soreness warm water/epsom salt. (Patient not taking: Reported on 07/04/2024)   [DISCONTINUED] OXYGEN 2lpm every shift (Patient not taking: Reported on 07/04/2024)   No facility-administered encounter medications on file as of 08/03/2024.    Review of Systems  Constitutional:  Negative for chills and fever.  HENT:  Negative for tinnitus.   Respiratory:  Negative for cough and shortness of breath.    Cardiovascular:  Negative for chest pain, palpitations and leg swelling.  Gastrointestinal:  Negative for abdominal pain, constipation and diarrhea.  Genitourinary:  Negative for dysuria, frequency and urgency.  Musculoskeletal:  Positive for back pain. Negative for myalgias.  Skin: Negative.   Neurological:  Negative for dizziness and headaches.  Psychiatric/Behavioral:  Positive for sleep disturbance.      Immunization History  Administered Date(s) Administered    sv, Bivalent, Protein Subunit Rsvpref,pf Marlow) 08/02/2023   INFLUENZA, HIGH DOSE SEASONAL PF 07/14/2017, 07/26/2023   PFIZER Comirnaty(Gray Top)Covid-19 Tri-Sucrose Vaccine 10/19/2019, 11/09/2019, 10/17/2020, 04/16/2021  PFIZER(Purple Top)SARS-COV-2 Vaccination 10/19/2019, 11/09/2019   Pneumococcal-Unspecified 04/10/2000   Tdap 03/29/2014, 07/09/2015   Zoster, Unspecified 04/16/2021   Pertinent  Health Maintenance Due  Topic Date Due   Influenza Vaccine  05/12/2024   DEXA SCAN  Completed      01/17/2021   10:00 PM 01/18/2021   12:00 PM 01/18/2021    8:30 PM 01/19/2021    9:00 AM 05/23/2024    3:18 PM  Fall Risk  Falls in the past year?     1  Was there an injury with Fall?     1  Fall Risk Category Calculator     2  (RETIRED) Patient Fall Risk Level High fall risk  High fall risk  High fall risk  High fall risk    Patient at Risk for Falls Due to     History of fall(s);Impaired balance/gait;Impaired mobility  Fall risk Follow up     Falls evaluation completed     Data saved with a previous flowsheet row definition   Functional Status Survey:    Vitals:   08/03/24 1500  BP: 116/66  Pulse: 73  Resp: 18  Temp: 98.1 F (36.7 C)  SpO2: 96%  Weight: 190 lb (86.2 kg)   Body mass index is 28.89 kg/m. Physical Exam Constitutional:      General: She is not in acute distress.    Appearance: She is well-developed. She is not diaphoretic.  HENT:     Head: Normocephalic and atraumatic.     Mouth/Throat:      Pharynx: No oropharyngeal exudate.  Eyes:     Conjunctiva/sclera: Conjunctivae normal.     Pupils: Pupils are equal, round, and reactive to light.  Cardiovascular:     Rate and Rhythm: Normal rate and regular rhythm.     Heart sounds: Normal heart sounds.  Pulmonary:     Effort: Pulmonary effort is normal.     Breath sounds: Normal breath sounds.  Abdominal:     General: Bowel sounds are normal.     Palpations: Abdomen is soft.  Musculoskeletal:     Cervical back: Normal range of motion and neck supple.     Right lower leg: No edema.     Left lower leg: No edema.  Skin:    General: Skin is warm and dry.  Neurological:     Mental Status: She is alert.  Psychiatric:        Mood and Affect: Mood normal.     Labs reviewed: Recent Labs    01/22/24 0540 01/23/24 0421 01/24/24 0210 01/26/24 0442 01/31/24 0000 02/17/24 0000 04/02/24 1424  NA 136 137 135  --  133* 137 138  K 2.8* 4.3 4.0  --  3.6 4.2 4.3  CL 96* 98 95*  --  92* 97* 103  CO2 32 31 32  --  33* 28* 27  GLUCOSE 130* 116* 114*  --   --   --  112*  BUN 21 21 21   --  25* 15 25*  CREATININE 0.73 0.71 0.66   < > 0.7 0.6 0.68  CALCIUM  7.7* 8.0* 8.1*  --  8.4* 8.6* 8.8*  MG 2.3  --   --   --   --   --   --    < > = values in this interval not displayed.   Recent Labs    01/06/24 0436 01/19/24 0948 01/31/24 0000 04/02/24 1424  AST 16 19 12* 22  ALT 14 13 18  17  ALKPHOS 47 131* 183* 69  BILITOT 1.0 1.0  --  1.0  PROT 5.7* 6.6  --  6.4*  ALBUMIN 3.1* 3.5 3.5 3.5   Recent Labs    01/23/24 0421 01/24/24 0210 01/24/24 0210 01/31/24 0000 02/17/24 0000 03/13/24 0000 04/02/24 1536  WBC 5.3 4.8   < > 11.8 8.2 3.9 6.8  NEUTROABS 3.2 3.1  --  10,231.00 6,642.00  --   --   HGB 12.5 12.3  --  12.8 13.1 11.8* 13.7  HCT 38.6 37.9  --  39 41 37 43.2  MCV 96.3 95.0  --   --   --   --  95.2  PLT 223 223  --  202 195 162 169   < > = values in this interval not displayed.   Lab Results  Component Value Date    TSH 1.60 02/17/2024   Lab Results  Component Value Date   HGBA1C 5.3 01/05/2024   Lab Results  Component Value Date   CHOL 146 01/06/2024   HDL 42 01/06/2024   LDLCALC 86 01/06/2024   TRIG 89 01/06/2024   CHOLHDL 3.5 01/06/2024    Significant Diagnostic Results in last 30 days:  No results found.  Assessment/Plan Senile osteoporosis Continues on prolia  through endocrine who is managing Continue cal and vit d  Low back pain Chronic reports tylenols helps.  States her mattress makes it hard for her to sleep and worsens back pain- will see if facility can get her a different mattress type Increase tylenol  to 1000 mg at bedtime   Late effect of cerebrovascular accident (CVA) Stable, no worsening deficit. Continues on plavix .   Insomnia, unspecified Chronic insomnia, reports this was an issue at home She is on melatonin 5 mg at bedtime Will add trazodone  25 mg at bedtime at this time to see if that helps.  Educated to limit daytime naps and increase physical activity as tolerates.   HTN (hypertension) Blood pressure well controlled, goal bp <140/90 Continue current medications and dietary modifications follow metabolic panel   Depression, controlled Well controlled on lexapro  20 mg daily Denies anxiety or depression at this time.   Constipation Well controlled on current regimen.   Hyperlipidemia Continues on lipitor Yearly lipids      Lindsey Barnes K. Caro [facility] [phone]

## 2024-08-03 NOTE — Assessment & Plan Note (Signed)
 Chronic insomnia, reports this was an issue at home She is on melatonin 5 mg at bedtime Will add trazodone  25 mg at bedtime at this time to see if that helps.  Educated to limit daytime naps and increase physical activity as tolerates.

## 2024-08-03 NOTE — Assessment & Plan Note (Signed)
 Chronic reports tylenols helps.  States her mattress makes it hard for her to sleep and worsens back pain- will see if facility can get her a different mattress type Increase tylenol  to 1000 mg at bedtime

## 2024-08-03 NOTE — Assessment & Plan Note (Signed)
 Continues on lipitor Yearly lipids

## 2024-08-03 NOTE — Assessment & Plan Note (Signed)
 Continues on prolia  through endocrine who is managing Continue cal and vit d

## 2024-08-03 NOTE — Assessment & Plan Note (Signed)
 Stable, no worsening deficit. Continues on plavix .

## 2024-08-03 NOTE — Assessment & Plan Note (Signed)
 Well-controlled on current regimen. ?

## 2024-08-30 ENCOUNTER — Non-Acute Institutional Stay (SKILLED_NURSING_FACILITY): Payer: Self-pay | Admitting: Internal Medicine

## 2024-08-30 ENCOUNTER — Encounter: Payer: Self-pay | Admitting: Internal Medicine

## 2024-08-30 DIAGNOSIS — Z8673 Personal history of transient ischemic attack (TIA), and cerebral infarction without residual deficits: Secondary | ICD-10-CM | POA: Insufficient documentation

## 2024-08-30 DIAGNOSIS — K219 Gastro-esophageal reflux disease without esophagitis: Secondary | ICD-10-CM | POA: Diagnosis not present

## 2024-08-30 DIAGNOSIS — M545 Low back pain, unspecified: Secondary | ICD-10-CM

## 2024-08-30 DIAGNOSIS — F32A Depression, unspecified: Secondary | ICD-10-CM | POA: Diagnosis not present

## 2024-08-30 DIAGNOSIS — M810 Age-related osteoporosis without current pathological fracture: Secondary | ICD-10-CM

## 2024-08-30 DIAGNOSIS — L853 Xerosis cutis: Secondary | ICD-10-CM | POA: Diagnosis not present

## 2024-08-30 DIAGNOSIS — Z5181 Encounter for therapeutic drug level monitoring: Secondary | ICD-10-CM | POA: Insufficient documentation

## 2024-08-30 DIAGNOSIS — G8929 Other chronic pain: Secondary | ICD-10-CM

## 2024-08-30 DIAGNOSIS — F419 Anxiety disorder, unspecified: Secondary | ICD-10-CM

## 2024-08-30 DIAGNOSIS — K5900 Constipation, unspecified: Secondary | ICD-10-CM | POA: Diagnosis not present

## 2024-08-30 DIAGNOSIS — F5101 Primary insomnia: Secondary | ICD-10-CM

## 2024-08-30 DIAGNOSIS — E782 Mixed hyperlipidemia: Secondary | ICD-10-CM

## 2024-08-30 DIAGNOSIS — I10 Essential (primary) hypertension: Secondary | ICD-10-CM | POA: Diagnosis not present

## 2024-08-30 NOTE — Assessment & Plan Note (Signed)
 08/30/24 stable.  She is on calcium  carbonate 600 mg twice daily

## 2024-08-30 NOTE — Assessment & Plan Note (Addendum)
 08/30/24 continue with scheduled Tylenol  1000 mg nightly.  She also has as needed Tylenol  650 mg every 6 hours as needed.

## 2024-08-30 NOTE — Assessment & Plan Note (Signed)
 08/30/24 Start Lac-Hydrin lotion 3 times a day as needed for itchy and dry skin.

## 2024-08-30 NOTE — Assessment & Plan Note (Signed)
 08/30/24 she has a prior history of stroke in March 2025 and April 2022.  She remains on Plavix  75 mg daily along with Lipitor 40 mg daily.  This is stable and she should continue both Plavix  and Lipitor.

## 2024-08-30 NOTE — Progress Notes (Signed)
 [facility] Routine Visit Progress Note    Location:  Other [place].  Nursing Home Room Number: [facility] [id number] Place of Service:  SNF (31)   PCP: Laurence Locus, DO   Patient Care Team: Laurence Locus, DO as PCP - General (Internal Medicine)   Extended Emergency Contact Information Primary Emergency Contact: [facility] Address: [address]          [place], [place] [address] Home Phone: [phone] Relation: Daughter   Goals of care: Advanced Directive information    08/30/2024    9:45 AM  Advanced Directives  Does Patient Have a Medical Advance Directive? Yes  Type of Advance Directive Out of facility DNR (pink MOST or yellow form)  Does patient want to make changes to medical advance directive? No - Patient declined    CODE STATUS: Do Not Resuscitate (DNR)   Chief Complaint  Patient presents with   Medical Management of Chronic Issues    Medical Management of Chronic Issues.      HPI: Pt is a 89 y.o. female seen today for medical management of chronic disease.  I met with the patient in her room with her daughter Madelin Ada at the bedside.  Patient's been at [facility] since April 2025.  She is now a long-term resident here.  She is an 89 year old female with a history of hypertension, hyperlipidemia, prior history of stroke, history of T12 compression fracture, esophageal reflux, who is seen for routine medical care in long-term care setting.  Patient states that she enjoys reading.  She had her glasses changed out about 1 to 2 years ago but this was before her stroke.  I encouraged the daughter to take her to an eye doctor or ophthalmologist to have her vision checked since she likes to read a lot.  Daughter states that the patient has regained all the weight she lost while she was ill with her stroke and fall.  Daughter is happy that she is now in long-term skilled care.  Patient states that she wants some lotion for her dry and itchy skin.  She states that  her nurse gave her some sample packets but she does not know what the lotion is called.  Daughter is brought in a bottle of Eucerin lotion for her to use in the meantime.  Patient and the daughter did not have any other health concerns.  Past Medical History:  Diagnosis Date   Anxiety    Breast cancer (HCC)    1990   Complication of anesthesia    Depression    Dyspnea    DOE   GERD (gastroesophageal reflux disease)    Headache(784.0)    HOH (hard of hearing)    Hypertension    Lymphoma (HCC)    Pain    CHRONIC BACK   Pain    CHRONIC BACK   Pneumonia    PONV (postoperative nausea and vomiting)    Tremors of nervous system    HANDS OCCAS   Past Surgical History:  Procedure Laterality Date   APPENDECTOMY     BACK SURGERY     BREAST SURGERY     CATARACT EXTRACTION W/PHACO Left 01/26/2017   Procedure: CATARACT EXTRACTION PHACO AND INTRAOCULAR LENS PLACEMENT (IOC);  Surgeon: Elsie Carmine, MD;  Location: [facility] ORS;  Service: Ophthalmology;  Laterality: Left;  US  53.7 AP% 20.3 CDE 10.90 Fluid pack lot # [id number]   CATARACT EXTRACTION W/PHACO Right 02/16/2017   Procedure: CATARACT EXTRACTION PHACO AND INTRAOCULAR LENS  PLACEMENT (IOC);  Surgeon: Jaye Fallow, MD;  Location: [facility] ORS;  Service: Ophthalmology;  Laterality: Right;  US  00:55 AP% 22.5 CDE 12.43 Fluid pack lot # [id number]   CESAREAN SECTION     CHOLECYSTECTOMY N/A 03/20/2018   Procedure: LAPAROSCOPIC CHOLECYSTECTOMY;  Surgeon: Rodolph Romano, MD;  Location: [facility] ORS;  Service: General;  Laterality: N/A;   MASTECTOMY Right 1992   TUBAL LIGATION       Allergies  Allergen Reactions   Amoxicillin Swelling    Blisters.    Aspirin Other (See Comments)    Stomach ulcers.   Benadryl [Diphenhydramine Hcl] Other (See Comments)    Hyperactive.     Outpatient Encounter Medications as of 08/30/2024  Medication Sig   acetaminophen  (TYLENOL ) 325 MG tablet Take 2 tablets (650 mg total) by mouth every 6 (six)  hours as needed for mild pain (pain score 1-3), fever or headache.   acetaminophen  (TYLENOL ) 500 MG tablet Take 1,000 mg by mouth at bedtime.   atorvastatin  (LIPITOR) 40 MG tablet Take 1 tablet (40 mg total) by mouth daily.   Baclofen 5 MG TABS Take 5 mg by mouth every 8 (eight) hours as needed.   calcium  carbonate (OSCAL) 1500 (600 Ca) MG TABS tablet Take 600 mg by mouth 2 (two) times daily with a meal.   cholecalciferol (VITAMIN D) 1000 units tablet Take 2,000 Units by mouth daily.   clopidogrel  (PLAVIX ) 75 MG tablet Take 1 tablet (75 mg total) by mouth daily.   denosumab  (PROLIA ) 60 MG/ML SOSY injection Inject 60 mg into the skin every 6 (six) months.   escitalopram  (LEXAPRO ) 20 MG tablet Take 1 tablet (20 mg total) by mouth daily.   fluticasone  (FLONASE ) 50 MCG/ACT nasal spray Place 2 sprays into both nostrils daily.   hydrochlorothiazide  (HYDRODIURIL ) 25 MG tablet Take 25 mg by mouth daily.   hydroxypropyl methylcellulose / hypromellose (ISOPTO TEARS / GONIOVISC) 2.5 % ophthalmic solution Place 1 drop into both eyes every 6 (six) hours as needed for dry eyes.   melatonin 5 MG TABS Take 5 mg by mouth at bedtime.   polyethylene glycol (MIRALAX  / GLYCOLAX ) 17 g packet Take 17 g by mouth daily.   propranolol (INDERAL) 20 MG tablet Take 20 mg by mouth 2 (two) times daily.   traZODone  (DESYREL ) 50 MG tablet Take 25 mg by mouth at bedtime.   vitamin B-12 (CYANOCOBALAMIN ) 500 MCG tablet Take 500 mcg by mouth daily.   acetaminophen  (TYLENOL ) 325 MG tablet Take 650 mg by mouth at bedtime. (Patient not taking: Reported on 08/30/2024)   docusate sodium  (COLACE) 100 MG capsule Take 100 mg by mouth daily as needed for mild constipation or moderate constipation. (Patient not taking: Reported on 08/30/2024)   KLOR-CON  M20 20 MEQ tablet Take 20 mEq by mouth in the morning and at bedtime. (Patient not taking: Reported on 08/30/2024)   meclizine  (ANTIVERT ) 25 MG tablet Take 25 mg by mouth 3 (three) times daily  as needed. For dizziness (Patient not taking: Reported on 08/30/2024)   No facility-administered encounter medications on file as of 08/30/2024.     Review of Systems  Constitutional: Negative.   HENT: Negative.    Eyes: Negative.   Respiratory: Negative.    Cardiovascular: Negative.   Gastrointestinal: Negative.   Endocrine: Negative.   Genitourinary: Negative.   Skin:        Dry and itchy skin  Allergic/Immunologic: Negative.   Neurological:        Has slight tremor in  her hands that prevents her from quilting like she used to.  Hematological: Negative.   Psychiatric/Behavioral:         Depression has been well-controlled.  No anxiety.  All other systems reviewed and are negative.     Immunization History  Administered Date(s) Administered    sv, Bivalent, Protein Subunit Rsvpref,pf (Abrysvo) 08/02/2023   INFLUENZA, HIGH DOSE SEASONAL PF 07/14/2017, 07/26/2023   PFIZER Comirnaty(Gray Top)Covid-19 Tri-Sucrose Vaccine 10/19/2019, 11/09/2019, 10/17/2020, 04/16/2021   PFIZER(Purple Top)SARS-COV-2 Vaccination 10/19/2019, 11/09/2019   Pneumococcal-Unspecified 04/10/2000   Tdap 03/29/2014, 07/09/2015   Unspecified SARS-COV-2 Vaccination 08/04/2024   Zoster, Unspecified 04/16/2021   Pertinent  Health Maintenance Due  Topic Date Due   Influenza Vaccine  05/12/2024   DEXA SCAN  Completed      01/17/2021   10:00 PM 01/18/2021   12:00 PM 01/18/2021    8:30 PM 01/19/2021    9:00 AM 05/23/2024    3:18 PM  Fall Risk  Falls in the past year?     1  Was there an injury with Fall?     1  Fall Risk Category Calculator     2  (RETIRED) Patient Fall Risk Level High fall risk  High fall risk  High fall risk  High fall risk    Patient at Risk for Falls Due to     History of fall(s);Impaired balance/gait;Impaired mobility  Fall risk Follow up     Falls evaluation completed     Data saved with a previous flowsheet row definition   Functional Status Survey:     Vitals:   08/30/24 0933   BP: 136/75  Pulse: 64  Resp: 16  Temp: 97.6 F (36.4 C)  SpO2: 96%  Weight: 189 lb 6.4 oz (85.9 kg)  Height: 5' 8 (1.727 m)   Body mass index is 28.8 kg/m. Physical Exam Vitals and nursing note reviewed.  Constitutional:      General: She is not in acute distress.    Appearance: She is not toxic-appearing or diaphoretic.  HENT:     Head: Normocephalic and atraumatic.     Nose: Nose normal.  Eyes:     General: No scleral icterus. Cardiovascular:     Rate and Rhythm: Normal rate and regular rhythm.  Pulmonary:     Effort: Pulmonary effort is normal. No respiratory distress.     Breath sounds: Normal breath sounds. No wheezing.  Abdominal:     General: Bowel sounds are normal. There is no distension.     Palpations: Abdomen is soft.     Tenderness: There is no abdominal tenderness.  Musculoskeletal:     Right lower leg: No edema.     Left ankle: Swelling present.     Comments: Mild trace pitting edema of left ankle  Skin:    General: Skin is warm and dry.     Capillary Refill: Capillary refill takes less than 2 seconds.  Neurological:     General: No focal deficit present.     Mental Status: She is alert and oriented to person, place, and time.    Labs reviewed: Recent Labs    01/22/24 0540 01/23/24 0421 01/24/24 0210 01/26/24 0442 01/31/24 0000 02/17/24 0000 04/02/24 1424  NA 136 137 135  --  133* 137 138  K 2.8* 4.3 4.0  --  3.6 4.2 4.3  CL 96* 98 95*  --  92* 97* 103  CO2 32 31 32  --  33* 28* 27  GLUCOSE 130*  116* 114*  --   --   --  112*  BUN 21 21 21   --  25* 15 25*  CREATININE 0.73 0.71 0.66   < > 0.7 0.6 0.68  CALCIUM  7.7* 8.0* 8.1*  --  8.4* 8.6* 8.8*  MG 2.3  --   --   --   --   --   --    < > = values in this interval not displayed.   Recent Labs    01/06/24 0436 01/19/24 0948 01/31/24 0000 04/02/24 1424  AST 16 19 12* 22  ALT 14 13 18 17   ALKPHOS 47 131* 183* 69  BILITOT 1.0 1.0  --  1.0  PROT 5.7* 6.6  --  6.4*  ALBUMIN 3.1* 3.5  3.5 3.5   Recent Labs    01/23/24 0421 01/24/24 0210 01/24/24 0210 01/31/24 0000 02/17/24 0000 03/13/24 0000 04/02/24 1536  WBC 5.3 4.8   < > 11.8 8.2 3.9 6.8  NEUTROABS 3.2 3.1  --  10,231.00 6,642.00  --   --   HGB 12.5 12.3  --  12.8 13.1 11.8* 13.7  HCT 38.6 37.9  --  39 41 37 43.2  MCV 96.3 95.0  --   --   --   --  95.2  PLT 223 223  --  202 195 162 169   < > = values in this interval not displayed.   Lab Results  Component Value Date   TSH 1.60 02/17/2024   Lab Results  Component Value Date   HGBA1C 5.3 01/05/2024   Lab Results  Component Value Date   CHOL 146 01/06/2024   HDL 42 01/06/2024   LDLCALC 86 01/06/2024   TRIG 89 01/06/2024   CHOLHDL 3.5 01/06/2024     Assessment/Plan Encounter for medication titration 08/30/24(4th qtr 2025)  pt has been on Lexapro  20 mg since admission. Will attempt to wean to 15 mg daily.   Depression, controlled 08/30/24(4th qtr 2025)  pt has been on Lexapro  20 mg since admission. Will attempt to wean to 15 mg daily.   Essential (primary) hypertension 08/30/24 continue with HCTZ 25 mg daily, propranolol 20 mg twice daily, KCl 20 mEq twice daily.  Hypertension is well-controlled on current regimen.  She is eating and drinking well.  She staying well-hydrated.  Last BUN and creatinine from May 2025 showed a BUN of 15 and a creatinine 0.64.  Estimated GFR was 85   History of stroke 08/30/24 she has a prior history of stroke in March 2025 and April 2022.  She remains on Plavix  75 mg daily along with Lipitor 40 mg daily.  This is stable and she should continue both Plavix  and Lipitor.   Anxiety 08/30/24(4th qtr 2025)  pt has been on Lexapro  20 mg since admission. Will attempt to wean to 15 mg daily.   Constipation 08/30/24 continue with MiraLAX  one half scoop once a day for constipation.  Her constipation is currently well-controlled.   Esophageal reflux 08/30/24 stable.  She is on calcium  carbonate 600 mg twice  daily   Hyperlipidemia 08/30/24 Stable.  Continue Lipitor 40 mg daily   Insomnia, unspecified 08/30/24 insomnia is well-controlled on trazodone  25 mg nightly  Low back pain 08/30/24 continue with scheduled Tylenol  1000 mg nightly.  She also has as needed Tylenol  650 mg every 6 hours as needed.   Senile osteoporosis 08/30/24 continue with supplemental calcium  twice daily, vitamin D3 2000 units daily and Prolia  every 6 months   Xerosis  of skin 08/30/24 Start Lac-Hydrin lotion 3 times a day as needed for itchy and dry skin.    Camellia Door, DO  [facility] [phone]

## 2024-08-30 NOTE — Assessment & Plan Note (Signed)
 08/30/24(4th qtr 2025)  pt has been on Lexapro  20 mg since admission. Will attempt to wean to 15 mg daily.

## 2024-08-30 NOTE — Assessment & Plan Note (Signed)
 08/30/24 Stable.  Continue Lipitor 40 mg daily

## 2024-08-30 NOTE — Assessment & Plan Note (Signed)
 08/30/24 insomnia is well-controlled on trazodone  25 mg nightly

## 2024-08-30 NOTE — Assessment & Plan Note (Signed)
 08/30/24 continue with supplemental calcium  twice daily, vitamin D3 2000 units daily and Prolia  every 6 months

## 2024-08-30 NOTE — Assessment & Plan Note (Signed)
 08/30/24 continue with MiraLAX  one half scoop once a day for constipation.  Her constipation is currently well-controlled.

## 2024-08-30 NOTE — Assessment & Plan Note (Signed)
 08/30/24 continue with HCTZ 25 mg daily, propranolol 20 mg twice daily, KCl 20 mEq twice daily.  Hypertension is well-controlled on current regimen.  She is eating and drinking well.  She staying well-hydrated.  Last BUN and creatinine from May 2025 showed a BUN of 15 and a creatinine 0.64.  Estimated GFR was 85

## 2024-09-21 ENCOUNTER — Non-Acute Institutional Stay (SKILLED_NURSING_FACILITY): Admitting: Nurse Practitioner

## 2024-09-21 ENCOUNTER — Encounter: Payer: Self-pay | Admitting: Nurse Practitioner

## 2024-09-21 DIAGNOSIS — M810 Age-related osteoporosis without current pathological fracture: Secondary | ICD-10-CM | POA: Diagnosis not present

## 2024-09-21 DIAGNOSIS — Z8673 Personal history of transient ischemic attack (TIA), and cerebral infarction without residual deficits: Secondary | ICD-10-CM | POA: Diagnosis not present

## 2024-09-21 DIAGNOSIS — F5101 Primary insomnia: Secondary | ICD-10-CM | POA: Diagnosis not present

## 2024-09-21 DIAGNOSIS — F32A Depression, unspecified: Secondary | ICD-10-CM | POA: Diagnosis not present

## 2024-09-21 DIAGNOSIS — K5900 Constipation, unspecified: Secondary | ICD-10-CM | POA: Diagnosis not present

## 2024-09-21 DIAGNOSIS — G8929 Other chronic pain: Secondary | ICD-10-CM | POA: Diagnosis not present

## 2024-09-21 DIAGNOSIS — I10 Essential (primary) hypertension: Secondary | ICD-10-CM | POA: Diagnosis not present

## 2024-09-21 DIAGNOSIS — E782 Mixed hyperlipidemia: Secondary | ICD-10-CM

## 2024-09-21 DIAGNOSIS — M545 Low back pain, unspecified: Secondary | ICD-10-CM | POA: Diagnosis not present

## 2024-09-21 NOTE — Assessment & Plan Note (Signed)
 Ongoing, continue with scheduled Tylenol  1000 mg nightly.  She also has as needed Tylenol  650 mg every 6 hours as needed. Will change lexapro  to cymbalta  to see if this also help with pain management.

## 2024-09-21 NOTE — Assessment & Plan Note (Signed)
 Well controlled on miralax

## 2024-09-21 NOTE — Assessment & Plan Note (Signed)
 continue with supplemental calcium  twice daily, vitamin D3 2000 units daily and Prolia  every 6 months per endocrine

## 2024-09-21 NOTE — Assessment & Plan Note (Signed)
 Continues on lipitor Follow lipids yearly

## 2024-09-21 NOTE — Assessment & Plan Note (Signed)
 Stable at this time, continues to have some tearful days knowing she wont go back to her house which was her home. Will change lexapro  to cymbalta at this time to see if this helps manage pain better due to chronic pain in back. Start cymbalta 30 mg daily Dc lexapro 

## 2024-09-21 NOTE — Assessment & Plan Note (Signed)
 With history of stroke in March 2025 and April 2022.  She remains on Plavix  75 mg daily along with Lipitor 40 mg daily.  Continue current regimen.

## 2024-09-21 NOTE — Progress Notes (Signed)
 Location:  Other [facility].  Nursing Home Room Number: [facility] [id number] Place of Service:  SNF [phone]) Harlene An, NP  PCP: Laurence Locus, DO  Patient Care Team: Laurence Locus, DO as PCP - General (Internal Medicine)  Extended Emergency Contact Information Primary Emergency Contact: [facility] Address: [address]          [place], [place] [address] Home Phone: [phone] Relation: Daughter  Goals of care: Advanced Directive information    08/30/2024    9:45 AM  Advanced Directives  Does Patient Have a Medical Advance Directive? Yes  Type of Advance Directive Out of facility DNR (pink MOST or yellow form)  Does patient want to make changes to medical advance directive? No - Patient declined     Chief Complaint  Patient presents with   Medical Management of Chronic Issues    Medical Management of Chronic Issues.     HPI:  Pt is a 89 y.o. female seen today for medical management of chronic disease.    Reports she continues to have chronic pain in her back.  Reports she has ongoing insomnia- wakes up and looks at the clock. States she is not taking naps during the day No anxiety or depression Reports a few weeks ago she said she felt like crying because she missed her house/home.  This feeling went away but still misses her home.   Ambulates with walker- states she sometimes has pain when walking but still able to continue to walk   Sometimes the pain keeps her awake at night.   No issues with constipation on mirlalax  Occasionally will have headaches in the back.  Uses pillow for support and that helps.    Past Medical History:  Diagnosis Date   Anxiety    Breast cancer (HCC)    1990   Complication of anesthesia    Depression    Dyspnea    DOE   GERD (gastroesophageal reflux disease)    Headache(784.0)    HOH (hard of hearing)    Hypertension    Lymphoma (HCC)    Pain    CHRONIC BACK   Pain    CHRONIC BACK   Pneumonia    PONV (postoperative  nausea and vomiting)    Tremors of nervous system    HANDS OCCAS   Past Surgical History:  Procedure Laterality Date   APPENDECTOMY     BACK SURGERY     BREAST SURGERY     CATARACT EXTRACTION W/PHACO Left 01/26/2017   Procedure: CATARACT EXTRACTION PHACO AND INTRAOCULAR LENS PLACEMENT (IOC);  Surgeon: Elsie Carmine, MD;  Location: [facility] ORS;  Service: Ophthalmology;  Laterality: Left;  US  53.7 AP% 20.3 CDE 10.90 Fluid pack lot # [id number]   CATARACT EXTRACTION W/PHACO Right 02/16/2017   Procedure: CATARACT EXTRACTION PHACO AND INTRAOCULAR LENS PLACEMENT (IOC);  Surgeon: Carmine Elsie, MD;  Location: [facility] ORS;  Service: Ophthalmology;  Laterality: Right;  US  00:55 AP% 22.5 CDE 12.43 Fluid pack lot # [id number]   CESAREAN SECTION     CHOLECYSTECTOMY N/A 03/20/2018   Procedure: LAPAROSCOPIC CHOLECYSTECTOMY;  Surgeon: Rodolph Romano, MD;  Location: [facility] ORS;  Service: General;  Laterality: N/A;   MASTECTOMY Right 1992   TUBAL LIGATION      Allergies[1]  Outpatient Encounter Medications as of 09/21/2024  Medication Sig   acetaminophen  (TYLENOL ) 325 MG tablet Take 2 tablets (650 mg total) by mouth every 6 (six) hours as needed for mild pain (pain score 1-3), fever or headache.  acetaminophen  (TYLENOL ) 500 MG tablet Take 1,000 mg by mouth at bedtime.   atorvastatin  (LIPITOR) 40 MG tablet Take 1 tablet (40 mg total) by mouth daily.   Baclofen 5 MG TABS Take 5 mg by mouth every 8 (eight) hours as needed.   calcium  carbonate (OSCAL) 1500 (600 Ca) MG TABS tablet Take 600 mg by mouth 2 (two) times daily with a meal.   cholecalciferol (VITAMIN D) 1000 units tablet Take 2,000 Units by mouth daily.   clopidogrel  (PLAVIX ) 75 MG tablet Take 1 tablet (75 mg total) by mouth daily.   denosumab  (PROLIA ) 60 MG/ML SOSY injection Inject 60 mg into the skin every 6 (six) months.   escitalopram  (LEXAPRO ) 20 MG tablet Take 1 tablet (20 mg total) by mouth daily.   fluticasone  (FLONASE ) 50 MCG/ACT  nasal spray Place 2 sprays into both nostrils daily.   hydrochlorothiazide  (HYDRODIURIL ) 25 MG tablet Take 25 mg by mouth daily.   hydroxypropyl methylcellulose / hypromellose (ISOPTO TEARS / GONIOVISC) 2.5 % ophthalmic solution Place 1 drop into both eyes every 6 (six) hours as needed for dry eyes.   KLOR-CON  M20 20 MEQ tablet Take 20 mEq by mouth in the morning and at bedtime.   melatonin 5 MG TABS Take 5 mg by mouth at bedtime. Take Two Tablets by mouth every 24 hours as needed.   polyethylene glycol (MIRALAX  / GLYCOLAX ) 17 g packet Take 17 g by mouth daily.   propranolol (INDERAL) 20 MG tablet Take 20 mg by mouth 2 (two) times daily.   traZODone  (DESYREL ) 50 MG tablet Take 25 mg by mouth at bedtime.   vitamin B-12 (CYANOCOBALAMIN ) 500 MCG tablet Take 500 mcg by mouth daily.   acetaminophen  (TYLENOL ) 325 MG tablet Take 650 mg by mouth at bedtime. (Patient not taking: Reported on 09/21/2024)   docusate sodium  (COLACE) 100 MG capsule Take 100 mg by mouth daily as needed for mild constipation or moderate constipation. (Patient not taking: Reported on 09/21/2024)   meclizine  (ANTIVERT ) 25 MG tablet Take 25 mg by mouth 3 (three) times daily as needed. For dizziness (Patient not taking: Reported on 09/21/2024)   No facility-administered encounter medications on file as of 09/21/2024.    Review of Systems  Constitutional:  Negative for activity change, appetite change, fatigue and unexpected weight change.  HENT:  Negative for congestion and hearing loss.   Eyes: Negative.   Respiratory:  Negative for cough and shortness of breath.   Cardiovascular:  Negative for chest pain, palpitations and leg swelling.  Gastrointestinal:  Negative for abdominal pain, constipation and diarrhea.  Genitourinary:  Negative for difficulty urinating and dysuria.  Musculoskeletal:  Positive for arthralgias, back pain and gait problem. Negative for myalgias.  Skin:  Negative for color change and wound.   Neurological:  Positive for headaches. Negative for dizziness and weakness.  Psychiatric/Behavioral:  Negative for agitation, behavioral problems and confusion.      Immunization History  Administered Date(s) Administered    sv, Bivalent, Protein Subunit Rsvpref,pf Marlow) 08/02/2023   INFLUENZA, HIGH DOSE SEASONAL PF 07/14/2017, 07/26/2023   Influenza-Unspecified 07/25/2024   PFIZER Comirnaty(Gray Top)Covid-19 Tri-Sucrose Vaccine 10/19/2019, 11/09/2019, 10/17/2020, 04/16/2021   PFIZER(Purple Top)SARS-COV-2 Vaccination 10/19/2019, 11/09/2019   Pneumococcal-Unspecified 04/10/2000   Tdap 03/29/2014, 07/09/2015   Unspecified SARS-COV-2 Vaccination 08/04/2024   Zoster, Unspecified 04/16/2021   Pertinent  Health Maintenance Due  Topic Date Due   Influenza Vaccine  Completed   Bone Density Scan  Completed      01/17/2021  10:00 PM 01/18/2021   12:00 PM 01/18/2021    8:30 PM 01/19/2021    9:00 AM 05/23/2024    3:18 PM  Fall Risk  Falls in the past year?     1  Was there an injury with Fall?     1   Fall Risk Category Calculator     2  (RETIRED) Patient Fall Risk Level High fall risk  High fall risk  High fall risk  High fall risk    Patient at Risk for Falls Due to     History of fall(s);Impaired balance/gait;Impaired mobility  Fall risk Follow up     Falls evaluation completed     Data saved with a previous flowsheet row definition   Functional Status Survey:    Vitals:   09/21/24 1030  BP: 132/67  Pulse: 62  Resp: 16  Temp: 98 F (36.7 C)  SpO2: 98%  Weight: 191 lb 6.4 oz (86.8 kg)  Height: 5' 8 (1.727 m)   Body mass index is 29.1 kg/m. Physical Exam Constitutional:      General: She is not in acute distress.    Appearance: She is well-developed. She is not diaphoretic.  HENT:     Head: Normocephalic and atraumatic.     Mouth/Throat:     Pharynx: No oropharyngeal exudate.  Eyes:     Conjunctiva/sclera: Conjunctivae normal.     Pupils: Pupils are equal, round,  and reactive to light.  Cardiovascular:     Rate and Rhythm: Normal rate and regular rhythm.     Heart sounds: Normal heart sounds.  Pulmonary:     Effort: Pulmonary effort is normal.     Breath sounds: Normal breath sounds.  Abdominal:     General: Bowel sounds are normal.     Palpations: Abdomen is soft.  Musculoskeletal:     Cervical back: Normal range of motion and neck supple.     Right lower leg: No edema.     Left lower leg: No edema.  Skin:    General: Skin is warm and dry.  Neurological:     Mental Status: She is alert.  Psychiatric:        Mood and Affect: Mood normal.     Labs reviewed: Recent Labs    01/22/24 0540 01/23/24 0421 01/24/24 0210 01/26/24 0442 01/31/24 0000 02/17/24 0000 04/02/24 1424  NA 136 137 135  --  133* 137 138  K 2.8* 4.3 4.0  --  3.6 4.2 4.3  CL 96* 98 95*  --  92* 97* 103  CO2 32 31 32  --  33* 28* 27  GLUCOSE 130* 116* 114*  --   --   --  112*  BUN 21 21 21   --  25* 15 25*  CREATININE 0.73 0.71 0.66   < > 0.7 0.6 0.68  CALCIUM  7.7* 8.0* 8.1*  --  8.4* 8.6* 8.8*  MG 2.3  --   --   --   --   --   --    < > = values in this interval not displayed.   Recent Labs    01/06/24 0436 01/19/24 0948 01/31/24 0000 04/02/24 1424  AST 16 19 12* 22  ALT 14 13 18 17   ALKPHOS 47 131* 183* 69  BILITOT 1.0 1.0  --  1.0  PROT 5.7* 6.6  --  6.4*  ALBUMIN 3.1* 3.5 3.5 3.5   Recent Labs    01/23/24 0421 01/24/24 0210 01/24/24 0210  01/31/24 0000 02/17/24 0000 03/13/24 0000 04/02/24 1536  WBC 5.3 4.8   < > 11.8 8.2 3.9 6.8  NEUTROABS 3.2 3.1  --  10,231.00 6,642.00  --   --   HGB 12.5 12.3  --  12.8 13.1 11.8* 13.7  HCT 38.6 37.9  --  39 41 37 43.2  MCV 96.3 95.0  --   --   --   --  95.2  PLT 223 223  --  202 195 162 169   < > = values in this interval not displayed.   Lab Results  Component Value Date   TSH 1.60 02/17/2024   Lab Results  Component Value Date   HGBA1C 5.3 01/05/2024   Lab Results  Component Value Date    CHOL 146 01/06/2024   HDL 42 01/06/2024   LDLCALC 86 01/06/2024   TRIG 89 01/06/2024   CHOLHDL 3.5 01/06/2024    Significant Diagnostic Results in last 30 days:  No results found.  Assessment/Plan Senile osteoporosis continue with supplemental calcium  twice daily, vitamin D3 2000 units daily and Prolia  every 6 months per endocrine    Low back pain Ongoing, continue with scheduled Tylenol  1000 mg nightly.  She also has as needed Tylenol  650 mg every 6 hours as needed. Will change lexapro  to cymbalta  to see if this also help with pain management.    History of stroke With history of stroke in March 2025 and April 2022.  She remains on Plavix  75 mg daily along with Lipitor 40 mg daily.  Continue current regimen.   Hyperlipidemia Continues on lipitor Follow lipids yearly   Essential (primary) hypertension Blood pressure well controlled on propranolol and hctz Continue current medications and dietary modifications follow metabolic panel    Depression, controlled Stable at this time, continues to have some tearful days knowing she wont go back to her house which was her home. Will change lexapro  to cymbalta at this time to see if this helps manage pain better due to chronic pain in back. Start cymbalta 30 mg daily Dc lexapro   Constipation Well controlled on miralax      Lindsey Barnes K. Caro BODILY [facility] [phone]        [1]  Allergies Allergen Reactions   Amoxicillin Swelling    Blisters.    Aspirin Other (See Comments)    Stomach ulcers.   Benadryl [Diphenhydramine Hcl] Other (See Comments)    Hyperactive.

## 2024-09-21 NOTE — Assessment & Plan Note (Signed)
 Blood pressure well controlled on propranolol and hctz Continue current medications and dietary modifications follow metabolic panel

## 2024-09-25 LAB — COMPREHENSIVE METABOLIC PANEL WITH GFR
Albumin: 3.4 — AB (ref 3.5–5.0)
Calcium: 8.7 (ref 8.7–10.7)
Globulin: 2
eGFR: 65

## 2024-09-25 LAB — BASIC METABOLIC PANEL WITH GFR
BUN: 22 — AB (ref 4–21)
CO2: 32 — AB (ref 13–22)
Chloride: 104 (ref 99–108)
Creatinine: 0.9 (ref 0.5–1.1)
Glucose: 91
Potassium: 4.2 meq/L (ref 3.5–5.1)
Sodium: 141 (ref 137–147)

## 2024-09-25 LAB — HEPATIC FUNCTION PANEL
ALT: 13 U/L (ref 7–35)
AST: 12 — AB (ref 13–35)
Alkaline Phosphatase: 57 (ref 25–125)
Bilirubin, Total: 0.6

## 2024-09-25 LAB — CBC AND DIFFERENTIAL
HCT: 38 (ref 36–46)
Hemoglobin: 12.1 (ref 12.0–16.0)
Platelets: 143 K/uL — AB (ref 150–400)
WBC: 3.5

## 2024-09-25 LAB — CBC: RBC: 3.96 (ref 3.87–5.11)

## 2024-10-16 ENCOUNTER — Encounter: Payer: Self-pay | Admitting: Orthopedic Surgery

## 2024-10-16 ENCOUNTER — Other Ambulatory Visit: Payer: Self-pay | Admitting: Orthopedic Surgery

## 2024-10-16 DIAGNOSIS — J069 Acute upper respiratory infection, unspecified: Secondary | ICD-10-CM

## 2024-10-16 DIAGNOSIS — Z8673 Personal history of transient ischemic attack (TIA), and cerebral infarction without residual deficits: Secondary | ICD-10-CM | POA: Diagnosis not present

## 2024-10-16 DIAGNOSIS — I10 Essential (primary) hypertension: Secondary | ICD-10-CM

## 2024-10-16 DIAGNOSIS — B9689 Other specified bacterial agents as the cause of diseases classified elsewhere: Secondary | ICD-10-CM | POA: Diagnosis not present

## 2024-10-16 DIAGNOSIS — R062 Wheezing: Secondary | ICD-10-CM

## 2024-10-16 NOTE — Progress Notes (Signed)
 " Location:  Other Nursing Home Room Number: [facility] [id number] Place of Service:  SNF (31) Provider:  Greig FORBES Cluster, NP   Laurence Locus, DO  Patient Care Team: Laurence Locus, DO as PCP - General (Internal Medicine)  Extended Emergency Contact Information Primary Emergency Contact: [facility] Address: [address]          [place], [place] [address] Home Phone: [phone] Relation: Daughter  Code Status:  DNR Goals of care: Advanced Directive information    08/30/2024    9:45 AM  Advanced Directives  Does Patient Have a Medical Advance Directive? Yes  Type of Advance Directive Out of facility DNR (pink MOST or yellow form)  Does patient want to make changes to medical advance directive? No - Patient declined     Chief Complaint  Patient presents with   Acute Visit    Cough and congestion     HPI:  Pt is a 89 y.o. female seen today for acute visit due to cough and congestion.   She currently resides on the skilled nursing unit at [facility] PMH: HTN, HLD, CVA, GERD, meningioma, osteoporosis, insomnia, back pain, depression and anxiety.   12/08 + Flu A. Completed tamiflu. 12/13 she was started on mucinex for ongoing nasal and chest congestion. Today, she reports worsening symptoms of fatigue and cough x 2 days. Cough is now productive with yellow/tan sputum. Denies chest pain or shortness of breath. Afebrile. Vitals stable.      Past Medical History:  Diagnosis Date   Anxiety    Breast cancer (HCC)    1990   Complication of anesthesia    Depression    Dyspnea    DOE   GERD (gastroesophageal reflux disease)    Headache(784.0)    HOH (hard of hearing)    Hypertension    Lymphoma (HCC)    Pain    CHRONIC BACK   Pain    CHRONIC BACK   Pneumonia    PONV (postoperative nausea and vomiting)    Tremors of nervous system    HANDS OCCAS   Past Surgical History:  Procedure Laterality Date   APPENDECTOMY     BACK SURGERY     BREAST SURGERY     CATARACT EXTRACTION  W/PHACO Left 01/26/2017   Procedure: CATARACT EXTRACTION PHACO AND INTRAOCULAR LENS PLACEMENT (IOC);  Surgeon: Elsie Carmine, MD;  Location: [facility] ORS;  Service: Ophthalmology;  Laterality: Left;  US  53.7 AP% 20.3 CDE 10.90 Fluid pack lot # [id number]   CATARACT EXTRACTION W/PHACO Right 02/16/2017   Procedure: CATARACT EXTRACTION PHACO AND INTRAOCULAR LENS PLACEMENT (IOC);  Surgeon: Carmine Elsie, MD;  Location: [facility] ORS;  Service: Ophthalmology;  Laterality: Right;  US  00:55 AP% 22.5 CDE 12.43 Fluid pack lot # [id number]   CESAREAN SECTION     CHOLECYSTECTOMY N/A 03/20/2018   Procedure: LAPAROSCOPIC CHOLECYSTECTOMY;  Surgeon: Rodolph Romano, MD;  Location: [facility] ORS;  Service: General;  Laterality: N/A;   MASTECTOMY Right 1992   TUBAL LIGATION      Allergies[1]  Outpatient Encounter Medications as of 10/16/2024  Medication Sig   acetaminophen  (TYLENOL ) 325 MG tablet Take 2 tablets (650 mg total) by mouth every 6 (six) hours as needed for mild pain (pain score 1-3), fever or headache.   acetaminophen  (TYLENOL ) 500 MG tablet Take 1,000 mg by mouth at bedtime.   atorvastatin  (LIPITOR) 40 MG tablet Take 1 tablet (40 mg total) by mouth daily.   Baclofen 5 MG TABS Take 5 mg by  mouth every 8 (eight) hours as needed.   calcium  carbonate (OSCAL) 1500 (600 Ca) MG TABS tablet Take 600 mg by mouth 2 (two) times daily with a meal.   cholecalciferol (VITAMIN D) 1000 units tablet Take 2,000 Units by mouth daily.   clopidogrel  (PLAVIX ) 75 MG tablet Take 1 tablet (75 mg total) by mouth daily.   denosumab  (PROLIA ) 60 MG/ML SOSY injection Inject 60 mg into the skin every 6 (six) months.   escitalopram  (LEXAPRO ) 20 MG tablet Take 1 tablet (20 mg total) by mouth daily.   fluticasone  (FLONASE ) 50 MCG/ACT nasal spray Place 2 sprays into both nostrils daily.   hydrochlorothiazide  (HYDRODIURIL ) 25 MG tablet Take 25 mg by mouth daily.   hydroxypropyl methylcellulose / hypromellose (ISOPTO TEARS / GONIOVISC)  2.5 % ophthalmic solution Place 1 drop into both eyes every 6 (six) hours as needed for dry eyes.   KLOR-CON  M20 20 MEQ tablet Take 20 mEq by mouth in the morning and at bedtime.   melatonin 5 MG TABS Take 5 mg by mouth at bedtime. Take Two Tablets by mouth every 24 hours as needed.   polyethylene glycol (MIRALAX  / GLYCOLAX ) 17 g packet Take 17 g by mouth daily.   propranolol (INDERAL) 20 MG tablet Take 20 mg by mouth 2 (two) times daily.   traZODone  (DESYREL ) 50 MG tablet Take 25 mg by mouth at bedtime.   vitamin B-12 (CYANOCOBALAMIN ) 500 MCG tablet Take 500 mcg by mouth daily.   No facility-administered encounter medications on file as of 10/16/2024.    Review of Systems  Constitutional:  Positive for fatigue. Negative for appetite change, chills and fever.  HENT:  Positive for congestion and rhinorrhea. Negative for ear pain, sore throat and trouble swallowing.   Eyes:  Negative for visual disturbance.  Respiratory:  Positive for cough and wheezing. Negative for shortness of breath.   Cardiovascular:  Negative for chest pain and leg swelling.  Gastrointestinal:  Negative for abdominal distention, abdominal pain, diarrhea, nausea and vomiting.  Genitourinary:  Negative for dysuria and hematuria.  Musculoskeletal:  Positive for gait problem. Negative for myalgias.  Skin:  Negative for wound.  Neurological:  Positive for weakness. Negative for dizziness and headaches.  Psychiatric/Behavioral:  Negative for dysphoric mood. The patient is not nervous/anxious.     Immunization History  Administered Date(s) Administered    sv, Bivalent, Protein Subunit Rsvpref,pf Marlow) 08/02/2023   INFLUENZA, HIGH DOSE SEASONAL PF 07/14/2017, 07/26/2023   Influenza-Unspecified 07/25/2024   PFIZER Comirnaty(Gray Top)Covid-19 Tri-Sucrose Vaccine 10/19/2019, 11/09/2019, 10/17/2020, 04/16/2021   PFIZER(Purple Top)SARS-COV-2 Vaccination 10/19/2019, 11/09/2019   Pneumococcal-Unspecified 04/10/2000   Tdap  03/29/2014, 07/09/2015   Unspecified SARS-COV-2 Vaccination 08/04/2024   Zoster, Unspecified 04/16/2021   Pertinent  Health Maintenance Due  Topic Date Due   Influenza Vaccine  Completed   Bone Density Scan  Completed      01/17/2021   10:00 PM 01/18/2021   12:00 PM 01/18/2021    8:30 PM 01/19/2021    9:00 AM 05/23/2024    3:18 PM  Fall Risk  Falls in the past year?     1  Was there an injury with Fall?     1   Fall Risk Category Calculator     2  (RETIRED) Patient Fall Risk Level High fall risk  High fall risk  High fall risk  High fall risk    Patient at Risk for Falls Due to     History of fall(s);Impaired balance/gait;Impaired mobility  Fall  risk Follow up     Falls evaluation completed     Data saved with a previous flowsheet row definition   Functional Status Survey:    Vitals:   10/16/24 1555  BP: (!) 154/83  Pulse: 78  Resp: (!) 22  Temp: 98.1 F (36.7 C)  SpO2: 95%  Weight: 191 lb 6.4 oz (86.8 kg)  Height: 5' 8 (1.727 m)   Body mass index is 29.1 kg/m. Physical Exam Vitals reviewed.  Constitutional:      General: She is not in acute distress. HENT:     Head: Normocephalic.     Right Ear: Tympanic membrane normal.     Left Ear: Tympanic membrane normal.     Nose: Rhinorrhea present.     Mouth/Throat:     Mouth: Mucous membranes are moist.     Pharynx: No posterior oropharyngeal erythema.  Eyes:     General:        Right eye: No discharge.        Left eye: No discharge.  Cardiovascular:     Rate and Rhythm: Normal rate and regular rhythm.     Pulses: Normal pulses.     Heart sounds: Normal heart sounds.  Pulmonary:     Effort: Pulmonary effort is normal. No respiratory distress.     Breath sounds: Examination of the right-upper field reveals wheezing and rhonchi. Examination of the left-upper field reveals wheezing and rhonchi. Examination of the right-middle field reveals rhonchi. Examination of the left-middle field reveals rhonchi. Wheezing and  rhonchi present. No rales.  Abdominal:     General: Bowel sounds are normal. There is no distension.     Palpations: Abdomen is soft.     Tenderness: There is no abdominal tenderness.  Musculoskeletal:     Cervical back: Neck supple.     Right lower leg: No edema.     Left lower leg: No edema.  Lymphadenopathy:     Cervical: No cervical adenopathy.  Skin:    General: Skin is warm.     Capillary Refill: Capillary refill takes less than 2 seconds.  Neurological:     General: No focal deficit present.     Mental Status: She is alert and oriented to person, place, and time.     Gait: Gait abnormal.  Psychiatric:        Mood and Affect: Mood normal.     Labs reviewed: Recent Labs    01/22/24 0540 01/23/24 0421 01/24/24 0210 01/26/24 0442 01/31/24 0000 02/17/24 0000 04/02/24 1424  NA 136 137 135  --  133* 137 138  K 2.8* 4.3 4.0  --  3.6 4.2 4.3  CL 96* 98 95*  --  92* 97* 103  CO2 32 31 32  --  33* 28* 27  GLUCOSE 130* 116* 114*  --   --   --  112*  BUN 21 21 21   --  25* 15 25*  CREATININE 0.73 0.71 0.66   < > 0.7 0.6 0.68  CALCIUM  7.7* 8.0* 8.1*  --  8.4* 8.6* 8.8*  MG 2.3  --   --   --   --   --   --    < > = values in this interval not displayed.   Recent Labs    01/06/24 0436 01/19/24 0948 01/31/24 0000 04/02/24 1424  AST 16 19 12* 22  ALT 14 13 18 17   ALKPHOS 47 131* 183* 69  BILITOT 1.0 1.0  --  1.0  PROT 5.7* 6.6  --  6.4*  ALBUMIN 3.1* 3.5 3.5 3.5   Recent Labs    01/23/24 0421 01/24/24 0210 01/24/24 0210 01/31/24 0000 02/17/24 0000 03/13/24 0000 04/02/24 1536  WBC 5.3 4.8   < > 11.8 8.2 3.9 6.8  NEUTROABS 3.2 3.1  --  10,231.00 6,642.00  --   --   HGB 12.5 12.3  --  12.8 13.1 11.8* 13.7  HCT 38.6 37.9  --  39 41 37 43.2  MCV 96.3 95.0  --   --   --   --  95.2  PLT 223 223  --  202 195 162 169   < > = values in this interval not displayed.   Lab Results  Component Value Date   TSH 1.60 02/17/2024   Lab Results  Component Value Date    HGBA1C 5.3 01/05/2024   Lab Results  Component Value Date   CHOL 146 01/06/2024   HDL 42 01/06/2024   LDLCALC 86 01/06/2024   TRIG 89 01/06/2024   CHOLHDL 3.5 01/06/2024    Significant Diagnostic Results in last 30 days:  No results found.  Assessment/Plan 1. Bacterial upper respiratory infection (Primary) - 12/08 + flu A, completed tamiflu - continues to have ongoing nasal/chest congestion - rhonchi and wheezing on exam - start doxycycline , no improvement CXR - cont mucinex - doxycycline  (VIBRA -TABS) 100 MG tablet; Take 1 tablet (100 mg total) by mouth 2 (two) times daily for 7 days.  2. Wheezing - noted to upper lobes  - predniSONE  (DELTASONE ) 20 MG tablet; Take 2 tablets (40 mg total) by mouth daily with breakfast for 5 days, THEN 1 tablet (20 mg total) daily with breakfast for 3 days.  3. Essential (primary) hypertension - controlled with hydrochlorothiazide  and propranolol  4. History of stroke - cont Plavix  and atorvastatin    Family/ staff Communication: plan discussed with patient and nurse  Labs/tests ordered:  none       [1]  Allergies Allergen Reactions   Amoxicillin Swelling    Blisters.    Aspirin Other (See Comments)    Stomach ulcers.   Benadryl [Diphenhydramine Hcl] Other (See Comments)    Hyperactive.   "

## 2024-10-17 MED ORDER — VITAMIN C 1000 MG PO TABS: 1000.0000 mg | ORAL_TABLET | Freq: Every day | ORAL | Status: AC

## 2024-10-17 MED ORDER — PREDNISONE 20 MG PO TABS: ORAL_TABLET | ORAL | Status: AC

## 2024-10-17 MED ORDER — DOXYCYCLINE HYCLATE 100 MG PO TABS: 100.0000 mg | ORAL_TABLET | Freq: Two times a day (BID) | ORAL | Status: AC

## 2024-10-20 ENCOUNTER — Encounter: Payer: Self-pay | Admitting: Orthopedic Surgery

## 2024-10-20 ENCOUNTER — Non-Acute Institutional Stay (SKILLED_NURSING_FACILITY): Payer: Self-pay | Admitting: Orthopedic Surgery

## 2024-10-20 DIAGNOSIS — J069 Acute upper respiratory infection, unspecified: Secondary | ICD-10-CM

## 2024-10-20 DIAGNOSIS — R062 Wheezing: Secondary | ICD-10-CM | POA: Diagnosis not present

## 2024-10-20 DIAGNOSIS — I10 Essential (primary) hypertension: Secondary | ICD-10-CM

## 2024-10-20 DIAGNOSIS — Z8673 Personal history of transient ischemic attack (TIA), and cerebral infarction without residual deficits: Secondary | ICD-10-CM

## 2024-10-20 DIAGNOSIS — B9689 Other specified bacterial agents as the cause of diseases classified elsewhere: Secondary | ICD-10-CM | POA: Diagnosis not present

## 2024-10-20 NOTE — Progress Notes (Signed)
 " Location:  [facility] Room Number: [facility] A Place of Service:  SNF [phone]) Provider:  Greig Cluster, NP   Patient Care Team: Laurence Locus, DO as PCP - General (Internal Medicine)  Extended Emergency Contact Information Primary Emergency Contact: [facility] Address: [address]          [place], [place] [address] Home Phone: [phone] Relation: Daughter  Code Status:  Do Not Hospitalize, DNR Goals of care: Advanced Directive information    08/30/2024    9:45 AM  Advanced Directives  Does Patient Have a Medical Advance Directive? Yes  Type of Advance Directive Out of facility DNR (pink MOST or yellow form)  Does patient want to make changes to medical advance directive? No - Patient declined     Chief Complaint  Patient presents with   Cough    HPI:  Pt is a 89 y.o. female seen today for acute visit due to ongoing cough.   She currently resides on the skilled nursing unit at [facility]. PMH: HTN, HLD, CVA, GERD, meningioma, osteoporosis, insomnia, back pain, depression and anxiety.   12/08 + Flu A. Completed tamiflu. 12/13 she was started on mucinex for ongoing nasal and chest congestion. Today, she reports worsening symptoms of fatigue and cough x 2 days. Cough is now productive with yellow/tan sputum. Denies chest pain or shortness of breath. Afebrile. Vitals stable.  Today, she continues to have productive cough. Admits to coughing less sputum. Color of sputum has also improved. She is wheezing less. Cough continues to be at all times of day preventing sleep. She had a dry cough several times during our encounter. 01/05 she was started on doxycyline and prednisone  taper. Remains afebrile.       Past Medical History:  Diagnosis Date   Anxiety    Breast cancer (HCC)    1990   Complication of anesthesia    Depression    Dyspnea    DOE   GERD (gastroesophageal reflux disease)    Headache(784.0)    HOH (hard of hearing)    Hypertension     Lymphoma (HCC)    Pain    CHRONIC BACK   Pain    CHRONIC BACK   Pneumonia    PONV (postoperative nausea and vomiting)    Tremors of nervous system    HANDS OCCAS   Past Surgical History:  Procedure Laterality Date   APPENDECTOMY     BACK SURGERY     BREAST SURGERY     CATARACT EXTRACTION W/PHACO Left 01/26/2017   Procedure: CATARACT EXTRACTION PHACO AND INTRAOCULAR LENS PLACEMENT (IOC);  Surgeon: Elsie Carmine, MD;  Location: [facility] ORS;  Service: Ophthalmology;  Laterality: Left;  US  53.7 AP% 20.3 CDE 10.90 Fluid pack lot # [id number]   CATARACT EXTRACTION W/PHACO Right 02/16/2017   Procedure: CATARACT EXTRACTION PHACO AND INTRAOCULAR LENS PLACEMENT (IOC);  Surgeon: Carmine Elsie, MD;  Location: [facility] ORS;  Service: Ophthalmology;  Laterality: Right;  US  00:55 AP% 22.5 CDE 12.43 Fluid pack lot # [id number]   CESAREAN SECTION     CHOLECYSTECTOMY N/A 03/20/2018   Procedure: LAPAROSCOPIC CHOLECYSTECTOMY;  Surgeon: Rodolph Romano, MD;  Location: [facility] ORS;  Service: General;  Laterality: N/A;   MASTECTOMY Right 1992   TUBAL LIGATION      Allergies[1]  Outpatient Encounter Medications as of 10/20/2024  Medication Sig   acetaminophen  (TYLENOL ) 325 MG tablet Take 2 tablets (650 mg total) by mouth every 6 (six) hours as needed for mild  pain (pain score 1-3), fever or headache.   acetaminophen  (TYLENOL ) 500 MG tablet Take 1,000 mg by mouth at bedtime.   Ascorbic Acid (VITAMIN C ) 1000 MG tablet Take 1 tablet (1,000 mg total) by mouth daily for 7 days.   atorvastatin  (LIPITOR) 40 MG tablet Take 1 tablet (40 mg total) by mouth daily.   Baclofen 5 MG TABS Take 5 mg by mouth every 8 (eight) hours as needed.   benzonatate (TESSALON) 200 MG capsule Take 200 mg by mouth 3 (three) times daily. Benzonatate Oral Capsule 200 MG (Benzonatate) Give 1 capsule by mouth three times a day for cough for 5 Days   calcium  carbonate (OSCAL) 1500 (600 Ca) MG TABS tablet Take 600 mg by mouth 2 (two) times  daily with a meal.   cholecalciferol (VITAMIN D) 1000 units tablet Take 2,000 Units by mouth daily.   clopidogrel  (PLAVIX ) 75 MG tablet Take 1 tablet (75 mg total) by mouth daily.   denosumab  (PROLIA ) 60 MG/ML SOSY injection Inject 60 mg into the skin every 6 (six) months.   doxycycline  (VIBRA -TABS) 100 MG tablet Take 1 tablet (100 mg total) by mouth 2 (two) times daily for 7 days.   DULoxetine (CYMBALTA) 30 MG capsule Take 30 mg by mouth daily. Give 30 mg by mouth one time a day for depression   fluticasone  (FLONASE ) 50 MCG/ACT nasal spray Place 2 sprays into both nostrils daily.   Guaifenesin 200 MG/5ML LIQD Take 10 mLs by mouth every 6 (six) hours as needed.   hydrochlorothiazide  (HYDRODIURIL ) 25 MG tablet Take 25 mg by mouth daily.   hydroxypropyl methylcellulose / hypromellose (ISOPTO TEARS / GONIOVISC) 2.5 % ophthalmic solution Place 1 drop into both eyes every 6 (six) hours as needed for dry eyes.   ipratropium-albuterol (DUONEB) 0.5-2.5 (3) MG/3ML SOLN Inhale 3 mLs into the lungs as directed. 1 vial inhale orally two times a day for cough for 5 Days   KLOR-CON  M20 20 MEQ tablet Take 20 mEq by mouth in the morning and at bedtime.   melatonin 5 MG TABS Take by mouth at bedtime. Take Two Tablets by mouth every 24 hours as needed.   polyethylene glycol (MIRALAX  / GLYCOLAX ) 17 g packet Take 17 g by mouth as directed. Give 0.5 scoop by mouth one time a day every 2 day(s) for bowel regimen   predniSONE  (DELTASONE ) 20 MG tablet Take 2 tablets (40 mg total) by mouth daily with breakfast for 5 days, THEN 1 tablet (20 mg total) daily with breakfast for 3 days.   propranolol (INDERAL) 20 MG tablet Take 20 mg by mouth 2 (two) times daily.   traZODone  (DESYREL ) 50 MG tablet Take 25 mg by mouth at bedtime.   vitamin B-12 (CYANOCOBALAMIN ) 500 MCG tablet Take 500 mcg by mouth daily.   escitalopram  (LEXAPRO ) 20 MG tablet Take 1 tablet (20 mg total) by mouth daily. (Patient not taking: Reported on  10/20/2024)   polyethylene glycol (MIRALAX  / GLYCOLAX ) 17 g packet Take 17 g by mouth daily. (Patient not taking: Reported on 10/20/2024)   No facility-administered encounter medications on file as of 10/20/2024.    Review of Systems  Constitutional:  Negative for fatigue and fever.  HENT:  Negative for congestion, ear pain, sore throat and trouble swallowing.   Respiratory:  Positive for cough and wheezing. Negative for shortness of breath.   Cardiovascular:  Negative for chest pain and leg swelling.  Gastrointestinal:  Negative for constipation, diarrhea, nausea and vomiting.  Musculoskeletal:  Negative for myalgias.  Skin:  Negative for wound.  Neurological:  Negative for dizziness and headaches.  Psychiatric/Behavioral:  Positive for sleep disturbance. Negative for dysphoric mood. The patient is not nervous/anxious.     Immunization History  Administered Date(s) Administered    sv, Bivalent, Protein Subunit Rsvpref,pf Marlow) 08/02/2023   INFLUENZA, HIGH DOSE SEASONAL PF 07/14/2017, 07/26/2023   Influenza-Unspecified 07/25/2024   PFIZER Comirnaty(Gray Top)Covid-19 Tri-Sucrose Vaccine 10/19/2019, 11/09/2019, 10/17/2020, 04/16/2021   PFIZER(Purple Top)SARS-COV-2 Vaccination 10/19/2019, 11/09/2019   Pneumococcal-Unspecified 04/10/2000   Tdap 03/29/2014, 07/09/2015   Unspecified SARS-COV-2 Vaccination 08/04/2024   Zoster, Unspecified 04/16/2021   Pertinent  Health Maintenance Due  Topic Date Due   Influenza Vaccine  Completed   Bone Density Scan  Completed      01/18/2021   12:00 PM 01/18/2021    8:30 PM 01/19/2021    9:00 AM 05/23/2024    3:18 PM 10/17/2024    9:41 AM  Fall Risk  Falls in the past year?    1 1  Was there an injury with Fall?    1  0  Fall Risk Category Calculator    2 1  (RETIRED) Patient Fall Risk Level High fall risk  High fall risk  High fall risk     Patient at Risk for Falls Due to    History of fall(s);Impaired balance/gait;Impaired mobility History of  fall(s);Impaired balance/gait  Fall risk Follow up    Falls evaluation completed Falls evaluation completed     Data saved with a previous flowsheet row definition   Functional Status Survey:    Vitals:   10/20/24 1421  BP: 136/72  Pulse: 78  Resp: 18  Temp: (!) 97.3 F (36.3 C)  SpO2: 97%  Weight: 193 lb 3.2 oz (87.6 kg)  Height: 5' 8 (1.727 m)   Body mass index is 29.38 kg/m. Physical Exam Vitals reviewed.  Constitutional:      General: She is not in acute distress. HENT:     Head: Normocephalic.     Right Ear: Tympanic membrane normal.     Left Ear: Tympanic membrane normal.     Nose: Nose normal.     Mouth/Throat:     Mouth: Mucous membranes are moist.     Pharynx: No posterior oropharyngeal erythema.  Eyes:     General:        Right eye: No discharge.        Left eye: No discharge.  Cardiovascular:     Rate and Rhythm: Normal rate and regular rhythm.     Pulses: Normal pulses.     Heart sounds: Normal heart sounds.  Pulmonary:     Effort: Pulmonary effort is normal. No respiratory distress.     Breath sounds: Examination of the right-upper field reveals wheezing and rhonchi. Examination of the left-upper field reveals wheezing and rhonchi. Wheezing and rhonchi present. No rales.  Abdominal:     General: Bowel sounds are normal.     Palpations: Abdomen is soft.  Musculoskeletal:     Cervical back: Neck supple.     Right lower leg: No edema.     Left lower leg: No edema.  Skin:    General: Skin is warm.     Capillary Refill: Capillary refill takes less than 2 seconds.  Neurological:     General: No focal deficit present.     Mental Status: She is alert and oriented to person, place, and time.     Gait: Gait abnormal.  Psychiatric:        Mood and Affect: Mood normal.     Labs reviewed: Recent Labs    01/22/24 0540 01/23/24 0421 01/24/24 0210 01/26/24 0442 02/17/24 0000 04/02/24 1424 09/25/24 0000  NA 136 137 135   < > 137 138 141  K 2.8*  4.3 4.0   < > 4.2 4.3 4.2  CL 96* 98 95*   < > 97* 103 104  CO2 32 31 32   < > 28* 27 32*  GLUCOSE 130* 116* 114*  --   --  112*  --   BUN 21 21 21    < > 15 25* 22*  CREATININE 0.73 0.71 0.66   < > 0.6 0.68 0.9  CALCIUM  7.7* 8.0* 8.1*   < > 8.6* 8.8* 8.7  MG 2.3  --   --   --   --   --   --    < > = values in this interval not displayed.   Recent Labs    01/06/24 0436 01/19/24 0948 01/31/24 0000 04/02/24 1424 09/25/24 0000  AST 16 19 12* 22 12*  ALT 14 13 18 17 13   ALKPHOS 47 131* 183* 69 57  BILITOT 1.0 1.0  --  1.0  --   PROT 5.7* 6.6  --  6.4*  --   ALBUMIN 3.1* 3.5 3.5 3.5 3.4*   Recent Labs    01/23/24 0421 01/24/24 0210 01/24/24 0210 01/31/24 0000 02/17/24 0000 03/13/24 0000 04/02/24 1536 09/25/24 0000  WBC 5.3 4.8   < > 11.8 8.2 3.9 6.8 3.5  NEUTROABS 3.2 3.1  --  10,231.00 6,642.00  --   --   --   HGB 12.5 12.3  --  12.8 13.1 11.8* 13.7 12.1  HCT 38.6 37.9  --  39 41 37 43.2 38  MCV 96.3 95.0  --   --   --   --  95.2  --   PLT 223 223  --  202 195 162 169 143*   < > = values in this interval not displayed.   Lab Results  Component Value Date   TSH 1.60 02/17/2024   Lab Results  Component Value Date   HGBA1C 5.3 01/05/2024   Lab Results  Component Value Date   CHOL 146 01/06/2024   HDL 42 01/06/2024   LDLCALC 86 01/06/2024   TRIG 89 01/06/2024   CHOLHDL 3.5 01/06/2024    Significant Diagnostic Results in last 30 days:  No results found.  Assessment/Plan 1. Bacterial upper respiratory infection (Primary) - 12/08 + flu/covid, completed tamiflu - wheezing to upper lobes and rhonchi> improved some  - productive cough improved, now dry keeping her up at night  - CXR - start tessalon pearls to help with cough  - cont doxycycline  and prednisone  taper  - no improvement> consider switching antibiotic  2. Wheezing - see above  - start duonebs BID x 5 days   3. Essential (primary) hypertension - controlled with hydrochlorothiazide  and  propranolol  4. History of stroke - cont atorvastatin  and Plavix      Family/ staff Communication: plan discussed with patient and nurse  Labs/tests ordered:  CXR     [1]  Allergies Allergen Reactions   Amoxicillin Swelling    Blisters.    Aspirin Other (See Comments)    Stomach ulcers.   Benadryl [Diphenhydramine Hcl] Other (See Comments)    Hyperactive.   Fentanyl     Penicillins    "

## 2024-10-26 ENCOUNTER — Non-Acute Institutional Stay (SKILLED_NURSING_FACILITY): Payer: Self-pay | Admitting: Internal Medicine

## 2024-10-26 ENCOUNTER — Encounter: Payer: Self-pay | Admitting: Internal Medicine

## 2024-10-26 DIAGNOSIS — R42 Dizziness and giddiness: Secondary | ICD-10-CM | POA: Diagnosis not present

## 2024-10-26 DIAGNOSIS — F5101 Primary insomnia: Secondary | ICD-10-CM | POA: Diagnosis not present

## 2024-10-26 NOTE — Assessment & Plan Note (Signed)
 Start meclizine  25 mg every 6 hours as needed for vertigo or sleep.

## 2024-10-26 NOTE — Progress Notes (Signed)
 [facility] Acute Care Progress Note    Location:    Nursing Home Room Number: [place] 412 A Place of Service:  SNF (31)   Laurence Locus, DO   Patient Care Team: Laurence Locus, DO as PCP - General (Internal Medicine)   Extended Emergency Contact Information Primary Emergency Contact: [facility] Address: [address]          [place], [place] [address] Home Phone: [phone] Relation: Daughter   Goals of care: Advanced Directive information    08/30/2024    9:45 AM  Advanced Directives  Does Patient Have a Medical Advance Directive? Yes  Type of Advance Directive Out of facility DNR (pink MOST or yellow form)  Does patient want to make changes to medical advance directive? No - Patient declined     CODE STATUS: Full Code Do Not Resuscitate (DNR)   Chief Complaint  Patient presents with   Acute Visit    Vertigo      HPI: Pt is a 89 y.o. female seen today for an acute visit for vertigo.  RN reports that the patient has been having vertigo symptoms for 2 3 days now.  Patient is an 89 year old female with a history of hypertension, major recurrent depression, hyperlipidemia, reflux, insomnia, anxiety, prior history of stroke who is being seen for acute visit today due to vertigo.  Patient is had several days of the room feel like it spinning.  She knows that this is vertigo.  She is is requesting some meclizine .  She states that she has been on scopolamine patches in the past but this makes her mouth very dry.  She has not had any falls.   She also states that she is having some trouble sleeping.   Past Medical History:  Diagnosis Date   Anxiety    Breast cancer (HCC)    1990   Complication of anesthesia    Depression    Dyspnea    DOE   GERD (gastroesophageal reflux disease)    Headache(784.0)    HOH (hard of hearing)    Hypertension    Lymphoma (HCC)    Pain    CHRONIC BACK   Pain    CHRONIC BACK   Pneumonia    PONV (postoperative nausea and vomiting)     Tremors of nervous system    HANDS OCCAS   Past Surgical History:  Procedure Laterality Date   APPENDECTOMY     BACK SURGERY     BREAST SURGERY     CATARACT EXTRACTION W/PHACO Left 01/26/2017   Procedure: CATARACT EXTRACTION PHACO AND INTRAOCULAR LENS PLACEMENT (IOC);  Surgeon: Elsie Carmine, MD;  Location: [facility] ORS;  Service: Ophthalmology;  Laterality: Left;  US  53.7 AP% 20.3 CDE 10.90 Fluid pack lot # [id number]   CATARACT EXTRACTION W/PHACO Right 02/16/2017   Procedure: CATARACT EXTRACTION PHACO AND INTRAOCULAR LENS PLACEMENT (IOC);  Surgeon: Carmine Elsie, MD;  Location: [facility] ORS;  Service: Ophthalmology;  Laterality: Right;  US  00:55 AP% 22.5 CDE 12.43 Fluid pack lot # [id number]   CESAREAN SECTION     CHOLECYSTECTOMY N/A 03/20/2018   Procedure: LAPAROSCOPIC CHOLECYSTECTOMY;  Surgeon: Rodolph Romano, MD;  Location: [facility] ORS;  Service: General;  Laterality: N/A;   MASTECTOMY Right 1992   TUBAL LIGATION       Allergies[1]   Outpatient Encounter Medications as of 10/26/2024  Medication Sig   acetaminophen  (TYLENOL ) 325 MG tablet Take 2 tablets (650 mg total) by mouth every 6 (six) hours as needed for  mild pain (pain score 1-3), fever or headache.   acetaminophen  (TYLENOL ) 500 MG tablet Take 1,000 mg by mouth at bedtime.   atorvastatin  (LIPITOR) 40 MG tablet Take 1 tablet (40 mg total) by mouth daily.   Baclofen 5 MG TABS Take 5 mg by mouth every 8 (eight) hours as needed.   calcium  carbonate (OSCAL) 1500 (600 Ca) MG TABS tablet Take 600 mg by mouth 2 (two) times daily with a meal.   cholecalciferol (VITAMIN D) 1000 units tablet Take 2,000 Units by mouth daily.   clopidogrel  (PLAVIX ) 75 MG tablet Take 1 tablet (75 mg total) by mouth daily.   denosumab  (PROLIA ) 60 MG/ML SOSY injection Inject 60 mg into the skin every 6 (six) months.   DULoxetine (CYMBALTA) 30 MG capsule Take 30 mg by mouth daily. Give 30 mg by mouth one time a day for depression   fluticasone  (FLONASE ) 50  MCG/ACT nasal spray Place 2 sprays into both nostrils daily.   hydrochlorothiazide  (HYDRODIURIL ) 25 MG tablet Take 25 mg by mouth daily.   hydroxypropyl methylcellulose / hypromellose (ISOPTO TEARS / GONIOVISC) 2.5 % ophthalmic solution Place 1 drop into both eyes every 6 (six) hours as needed for dry eyes.   KLOR-CON  M20 20 MEQ tablet Take 20 mEq by mouth in the morning and at bedtime.   meclizine  (ANTIVERT ) 25 MG tablet Take 25 mg by mouth QID. 1 tablet by mouth four times a day for vertigo, insomnia   melatonin 5 MG TABS Take by mouth at bedtime. Take Two Tablets by mouth every 24 hours as needed.   polyethylene glycol (MIRALAX  / GLYCOLAX ) 17 g packet Take 17 g by mouth as directed. Give 0.5 scoop by mouth one time a day every 2 day(s) for bowel regimen   propranolol (INDERAL) 20 MG tablet Take 20 mg by mouth 2 (two) times daily.   traZODone  (DESYREL ) 50 MG tablet Take 25 mg by mouth at bedtime.   vitamin B-12 (CYANOCOBALAMIN ) 500 MCG tablet Take 500 mcg by mouth daily.   benzonatate (TESSALON) 200 MG capsule Take 200 mg by mouth 3 (three) times daily. Benzonatate Oral Capsule 200 MG (Benzonatate) Give 1 capsule by mouth three times a day for cough for 5 Days (Patient not taking: Reported on 10/26/2024)   Guaifenesin 200 MG/5ML LIQD Take 10 mLs by mouth every 6 (six) hours as needed. (Patient not taking: Reported on 10/26/2024)   ipratropium-albuterol (DUONEB) 0.5-2.5 (3) MG/3ML SOLN Inhale 3 mLs into the lungs as directed. 1 vial inhale orally two times a day for cough for 5 Days (Patient not taking: Reported on 10/26/2024)   No facility-administered encounter medications on file as of 10/26/2024.     Review of Systems  Neurological:        Vertiginous symptoms.  Room feels like it is spinning when she is sitting still.  All other systems reviewed and are negative.    Immunization History  Administered Date(s) Administered    sv, Bivalent, Protein Subunit Rsvpref,pf Marlow)  08/02/2023   INFLUENZA, HIGH DOSE SEASONAL PF 07/14/2017, 07/26/2023   Influenza-Unspecified 07/25/2024   PFIZER Comirnaty(Gray Top)Covid-19 Tri-Sucrose Vaccine 10/19/2019, 11/09/2019, 10/17/2020, 04/16/2021   PFIZER(Purple Top)SARS-COV-2 Vaccination 10/19/2019, 11/09/2019   Pneumococcal-Unspecified 04/10/2000   Tdap 03/29/2014, 07/09/2015   Unspecified SARS-COV-2 Vaccination 08/04/2024   Zoster, Unspecified 04/16/2021   Pertinent  Health Maintenance Due  Topic Date Due   Influenza Vaccine  Completed   Bone Density Scan  Completed      01/18/2021   12:00  PM 01/18/2021    8:30 PM 01/19/2021    9:00 AM 05/23/2024    3:18 PM 10/17/2024    9:41 AM  Fall Risk  Falls in the past year?    1 1  Was there an injury with Fall?    1  0  Fall Risk Category Calculator    2 1  (RETIRED) Patient Fall Risk Level High fall risk  High fall risk  High fall risk     Patient at Risk for Falls Due to    History of fall(s);Impaired balance/gait;Impaired mobility History of fall(s);Impaired balance/gait  Fall risk Follow up    Falls evaluation completed Falls evaluation completed     Data saved with a previous flowsheet row definition   Functional Status Survey:     Vitals:   10/26/24 1146  BP: 132/78  Pulse: 87  Resp: 17  Temp: 98 F (36.7 C)  SpO2: 97%  Weight: 193 lb 3.2 oz (87.6 kg)  Height: 5' 8 (1.727 m)   Body mass index is 29.38 kg/m. Physical Exam Vitals and nursing note reviewed.  Constitutional:      Appearance: She is obese.  HENT:     Head: Normocephalic and atraumatic.  Cardiovascular:     Rate and Rhythm: Normal rate.  Pulmonary:     Effort: Pulmonary effort is normal. No respiratory distress.  Skin:    General: Skin is warm and dry.  Neurological:     Mental Status: She is alert.     Comments: No horizontal or nystagmus with lateral gazing.      Labs reviewed: Recent Labs    01/22/24 0540 01/23/24 0421 01/24/24 0210 01/26/24 0442 02/17/24 0000 04/02/24 1424  09/25/24 0000  NA 136 137 135   < > 137 138 141  K 2.8* 4.3 4.0   < > 4.2 4.3 4.2  CL 96* 98 95*   < > 97* 103 104  CO2 32 31 32   < > 28* 27 32*  GLUCOSE 130* 116* 114*  --   --  112*  --   BUN 21 21 21    < > 15 25* 22*  CREATININE 0.73 0.71 0.66   < > 0.6 0.68 0.9  CALCIUM  7.7* 8.0* 8.1*   < > 8.6* 8.8* 8.7  MG 2.3  --   --   --   --   --   --    < > = values in this interval not displayed.   Recent Labs    01/06/24 0436 01/19/24 0948 01/31/24 0000 04/02/24 1424 09/25/24 0000  AST 16 19 12* 22 12*  ALT 14 13 18 17 13   ALKPHOS 47 131* 183* 69 57  BILITOT 1.0 1.0  --  1.0  --   PROT 5.7* 6.6  --  6.4*  --   ALBUMIN 3.1* 3.5 3.5 3.5 3.4*   Recent Labs    01/23/24 0421 01/24/24 0210 01/24/24 0210 01/31/24 0000 02/17/24 0000 03/13/24 0000 04/02/24 1536 09/25/24 0000  WBC 5.3 4.8   < > 11.8 8.2 3.9 6.8 3.5  NEUTROABS 3.2 3.1  --  10,231.00 6,642.00  --   --   --   HGB 12.5 12.3  --  12.8 13.1 11.8* 13.7 12.1  HCT 38.6 37.9  --  39 41 37 43.2 38  MCV 96.3 95.0  --   --   --   --  95.2  --   PLT 223 223  --  202  195 162 169 143*   < > = values in this interval not displayed.   Lab Results  Component Value Date   TSH 1.60 02/17/2024   Lab Results  Component Value Date   HGBA1C 5.3 01/05/2024   Lab Results  Component Value Date   CHOL 146 01/06/2024   HDL 42 01/06/2024   LDLCALC 86 01/06/2024   TRIG 89 01/06/2024   CHOLHDL 3.5 01/06/2024     Assessment and Plan: Vertigo Start meclizine  25 mg every 6 hours as needed for vertigo or sleep.  Insomnia, unspecified Start meclizine  25 mg every 6 hours as needed for vertigo or sleep.  Continue with trazodone  25 mg at bedtime.    Camellia Door, DO [facility] [phone]     [1]  Allergies Allergen Reactions   Amoxicillin Swelling    Blisters.    Aspirin Other (See Comments)    Stomach ulcers.   Benadryl [Diphenhydramine Hcl] Other (See Comments)    Hyperactive.   Fentanyl      Penicillins

## 2024-10-26 NOTE — Progress Notes (Deleted)
 [facility] Acute Care Progress Note    Location:    Nursing Home Room Number: [place] [id number] Place of Service:  SNF (31)   Laurence Locus, DO   Patient Care Team: Laurence Locus, DO as PCP - General (Internal Medicine)   Extended Emergency Contact Information Primary Emergency Contact: [facility] Address: [address]          [place], [place] [address] Home Phone: [phone] Relation: Daughter   Goals of care: Advanced Directive information    08/30/2024    9:45 AM  Advanced Directives  Does Patient Have a Medical Advance Directive? Yes  Type of Advance Directive Out of facility DNR (pink MOST or yellow form)  Does patient want to make changes to medical advance directive? No - Patient declined     {CODE STATUS:29822::Full Code:1}   Chief Complaint  Patient presents with   Acute Visit    Vertigo      HPI: Pt is a 89 y.o. female seen today for an acute visit for ***     Past Medical History:  Diagnosis Date   Anxiety    Breast cancer (HCC)    1990   Complication of anesthesia    Depression    Dyspnea    DOE   GERD (gastroesophageal reflux disease)    Headache(784.0)    HOH (hard of hearing)    Hypertension    Lymphoma (HCC)    Pain    CHRONIC BACK   Pain    CHRONIC BACK   Pneumonia    PONV (postoperative nausea and vomiting)    Tremors of nervous system    HANDS OCCAS   Past Surgical History:  Procedure Laterality Date   APPENDECTOMY     BACK SURGERY     BREAST SURGERY     CATARACT EXTRACTION W/PHACO Left 01/26/2017   Procedure: CATARACT EXTRACTION PHACO AND INTRAOCULAR LENS PLACEMENT (IOC);  Surgeon: Elsie Carmine, MD;  Location: [facility] ORS;  Service: Ophthalmology;  Laterality: Left;  US  53.7 AP% 20.3 CDE 10.90 Fluid pack lot # [id number]   CATARACT EXTRACTION W/PHACO Right 02/16/2017   Procedure: CATARACT EXTRACTION PHACO AND INTRAOCULAR LENS PLACEMENT (IOC);  Surgeon: Carmine Elsie, MD;  Location: [facility] ORS;  Service: Ophthalmology;   Laterality: Right;  US  00:55 AP% 22.5 CDE 12.43 Fluid pack lot # [id number]   CESAREAN SECTION     CHOLECYSTECTOMY N/A 03/20/2018   Procedure: LAPAROSCOPIC CHOLECYSTECTOMY;  Surgeon: Rodolph Romano, MD;  Location: [facility] ORS;  Service: General;  Laterality: N/A;   MASTECTOMY Right 1992   TUBAL LIGATION       Allergies[1]   Outpatient Encounter Medications as of 10/26/2024  Medication Sig   acetaminophen  (TYLENOL ) 325 MG tablet Take 2 tablets (650 mg total) by mouth every 6 (six) hours as needed for mild pain (pain score 1-3), fever or headache.   acetaminophen  (TYLENOL ) 500 MG tablet Take 1,000 mg by mouth at bedtime.   atorvastatin  (LIPITOR) 40 MG tablet Take 1 tablet (40 mg total) by mouth daily.   Baclofen 5 MG TABS Take 5 mg by mouth every 8 (eight) hours as needed.   calcium  carbonate (OSCAL) 1500 (600 Ca) MG TABS tablet Take 600 mg by mouth 2 (two) times daily with a meal.   cholecalciferol (VITAMIN D) 1000 units tablet Take 2,000 Units by mouth daily.   clopidogrel  (PLAVIX ) 75 MG tablet Take 1 tablet (75 mg total) by mouth daily.   denosumab  (PROLIA ) 60 MG/ML SOSY injection Inject 60  mg into the skin every 6 (six) months.   DULoxetine (CYMBALTA) 30 MG capsule Take 30 mg by mouth daily. Give 30 mg by mouth one time a day for depression   fluticasone  (FLONASE ) 50 MCG/ACT nasal spray Place 2 sprays into both nostrils daily.   hydrochlorothiazide  (HYDRODIURIL ) 25 MG tablet Take 25 mg by mouth daily.   hydroxypropyl methylcellulose / hypromellose (ISOPTO TEARS / GONIOVISC) 2.5 % ophthalmic solution Place 1 drop into both eyes every 6 (six) hours as needed for dry eyes.   KLOR-CON  M20 20 MEQ tablet Take 20 mEq by mouth in the morning and at bedtime.   meclizine  (ANTIVERT ) 25 MG tablet Take 25 mg by mouth QID. 1 tablet by mouth four times a day for vertigo, insomnia   melatonin 5 MG TABS Take by mouth at bedtime. Take Two Tablets by mouth every 24 hours as needed.   polyethylene  glycol (MIRALAX  / GLYCOLAX ) 17 g packet Take 17 g by mouth as directed. Give 0.5 scoop by mouth one time a day every 2 day(s) for bowel regimen   propranolol (INDERAL) 20 MG tablet Take 20 mg by mouth 2 (two) times daily.   traZODone  (DESYREL ) 50 MG tablet Take 25 mg by mouth at bedtime.   vitamin B-12 (CYANOCOBALAMIN ) 500 MCG tablet Take 500 mcg by mouth daily.   benzonatate (TESSALON) 200 MG capsule Take 200 mg by mouth 3 (three) times daily. Benzonatate Oral Capsule 200 MG (Benzonatate) Give 1 capsule by mouth three times a day for cough for 5 Days (Patient not taking: Reported on 10/26/2024)   Guaifenesin 200 MG/5ML LIQD Take 10 mLs by mouth every 6 (six) hours as needed. (Patient not taking: Reported on 10/26/2024)   ipratropium-albuterol (DUONEB) 0.5-2.5 (3) MG/3ML SOLN Inhale 3 mLs into the lungs as directed. 1 vial inhale orally two times a day for cough for 5 Days (Patient not taking: Reported on 10/26/2024)   No facility-administered encounter medications on file as of 10/26/2024.     Review of Systems***   Immunization History  Administered Date(s) Administered    sv, Bivalent, Protein Subunit Rsvpref,pf (Abrysvo) 08/02/2023   INFLUENZA, HIGH DOSE SEASONAL PF 07/14/2017, 07/26/2023   Influenza-Unspecified 07/25/2024   PFIZER Comirnaty(Gray Top)Covid-19 Tri-Sucrose Vaccine 10/19/2019, 11/09/2019, 10/17/2020, 04/16/2021   PFIZER(Purple Top)SARS-COV-2 Vaccination 10/19/2019, 11/09/2019   Pneumococcal-Unspecified 04/10/2000   Tdap 03/29/2014, 07/09/2015   Unspecified SARS-COV-2 Vaccination 08/04/2024   Zoster, Unspecified 04/16/2021   Pertinent  Health Maintenance Due  Topic Date Due   Influenza Vaccine  Completed   Bone Density Scan  Completed      01/18/2021   12:00 PM 01/18/2021    8:30 PM 01/19/2021    9:00 AM 05/23/2024    3:18 PM 10/17/2024    9:41 AM  Fall Risk  Falls in the past year?    1 1  Was there an injury with Fall?    1  0  Fall Risk Category Calculator    2 1   (RETIRED) Patient Fall Risk Level High fall risk  High fall risk  High fall risk     Patient at Risk for Falls Due to    History of fall(s);Impaired balance/gait;Impaired mobility History of fall(s);Impaired balance/gait  Fall risk Follow up    Falls evaluation completed Falls evaluation completed     Data saved with a previous flowsheet row definition   Functional Status Survey:     Vitals:   10/26/24 1146  BP: 132/78  Pulse: 87  Resp:  17  Temp: 98 F (36.7 C)  SpO2: 97%  Weight: 193 lb 3.2 oz (87.6 kg)  Height: 5' 8 (1.727 m)   Body mass index is 29.38 kg/m. Physical Exam***   Labs reviewed: Recent Labs    01/22/24 0540 01/23/24 0421 01/24/24 0210 01/26/24 0442 02/17/24 0000 04/02/24 1424 09/25/24 0000  NA 136 137 135   < > 137 138 141  K 2.8* 4.3 4.0   < > 4.2 4.3 4.2  CL 96* 98 95*   < > 97* 103 104  CO2 32 31 32   < > 28* 27 32*  GLUCOSE 130* 116* 114*  --   --  112*  --   BUN 21 21 21    < > 15 25* 22*  CREATININE 0.73 0.71 0.66   < > 0.6 0.68 0.9  CALCIUM  7.7* 8.0* 8.1*   < > 8.6* 8.8* 8.7  MG 2.3  --   --   --   --   --   --    < > = values in this interval not displayed.   Recent Labs    01/06/24 0436 01/19/24 0948 01/31/24 0000 04/02/24 1424 09/25/24 0000  AST 16 19 12* 22 12*  ALT 14 13 18 17 13   ALKPHOS 47 131* 183* 69 57  BILITOT 1.0 1.0  --  1.0  --   PROT 5.7* 6.6  --  6.4*  --   ALBUMIN 3.1* 3.5 3.5 3.5 3.4*   Recent Labs    01/23/24 0421 01/24/24 0210 01/24/24 0210 01/31/24 0000 02/17/24 0000 03/13/24 0000 04/02/24 1536 09/25/24 0000  WBC 5.3 4.8   < > 11.8 8.2 3.9 6.8 3.5  NEUTROABS 3.2 3.1  --  10,231.00 6,642.00  --   --   --   HGB 12.5 12.3  --  12.8 13.1 11.8* 13.7 12.1  HCT 38.6 37.9  --  39 41 37 43.2 38  MCV 96.3 95.0  --   --   --   --  95.2  --   PLT 223 223  --  202 195 162 169 143*   < > = values in this interval not displayed.   Lab Results  Component Value Date   TSH 1.60 02/17/2024   Lab Results   Component Value Date   HGBA1C 5.3 01/05/2024   Lab Results  Component Value Date   CHOL 146 01/06/2024   HDL 42 01/06/2024   LDLCALC 86 01/06/2024   TRIG 89 01/06/2024   CHOLHDL 3.5 01/06/2024     Significant Diagnostic Results in last 30 days: No results found.   Assessment and Plan: Vertigo Start meclizine  25 mg every 6 hours as needed for vertigo or sleep.  Insomnia, unspecified Start meclizine  25 mg every 6 hours as needed for vertigo or sleep.  Continue with trazodone  25 mg at bedtime.   No orders of the defined types were placed in this encounter.  There are no discontinued medications. No orders of the defined types were placed in this encounter.    Pablo CHRISTELLA Pouch, CMA [facility] [phone]     [1]  Allergies Allergen Reactions   Amoxicillin Swelling    Blisters.    Aspirin Other (See Comments)    Stomach ulcers.   Benadryl [Diphenhydramine Hcl] Other (See Comments)    Hyperactive.   Fentanyl     Penicillins

## 2024-10-26 NOTE — Assessment & Plan Note (Addendum)
 Start meclizine  25 mg every 6 hours as needed for vertigo or sleep.  Continue with trazodone  25 mg at bedtime.

## 2024-10-30 ENCOUNTER — Non-Acute Institutional Stay (SKILLED_NURSING_FACILITY): Payer: Self-pay | Admitting: Orthopedic Surgery

## 2024-10-30 ENCOUNTER — Encounter: Payer: Self-pay | Admitting: Orthopedic Surgery

## 2024-10-30 DIAGNOSIS — F5101 Primary insomnia: Secondary | ICD-10-CM | POA: Diagnosis not present

## 2024-10-30 DIAGNOSIS — I10 Essential (primary) hypertension: Secondary | ICD-10-CM | POA: Diagnosis not present

## 2024-10-30 DIAGNOSIS — Z8673 Personal history of transient ischemic attack (TIA), and cerebral infarction without residual deficits: Secondary | ICD-10-CM

## 2024-10-30 DIAGNOSIS — B9689 Other specified bacterial agents as the cause of diseases classified elsewhere: Secondary | ICD-10-CM | POA: Diagnosis not present

## 2024-10-30 DIAGNOSIS — M810 Age-related osteoporosis without current pathological fracture: Secondary | ICD-10-CM | POA: Diagnosis not present

## 2024-10-30 DIAGNOSIS — R42 Dizziness and giddiness: Secondary | ICD-10-CM | POA: Diagnosis not present

## 2024-10-30 DIAGNOSIS — M545 Low back pain, unspecified: Secondary | ICD-10-CM | POA: Diagnosis not present

## 2024-10-30 DIAGNOSIS — J069 Acute upper respiratory infection, unspecified: Secondary | ICD-10-CM

## 2024-10-30 DIAGNOSIS — F339 Major depressive disorder, recurrent, unspecified: Secondary | ICD-10-CM | POA: Diagnosis not present

## 2024-10-30 DIAGNOSIS — E782 Mixed hyperlipidemia: Secondary | ICD-10-CM

## 2024-10-30 DIAGNOSIS — G8929 Other chronic pain: Secondary | ICD-10-CM | POA: Diagnosis not present

## 2024-10-30 NOTE — Progress Notes (Unsigned)
 " Location:  Other [place].  Nursing Home Room Number: [facility] [id number] Place of Service:  SNF [phone]) Provider:  Greig Cluster, NP  PCP: Laurence Locus, DO  Patient Care Team: Laurence Locus, DO as PCP - General (Internal Medicine)  Extended Emergency Contact Information Primary Emergency Contact: [facility] Address: [address]          [place], [place] [address] Home Phone: [phone] Relation: Daughter  Code Status:  DNR Goals of care: Advanced Directive information    10/30/2024   12:24 PM  Advanced Directives  Does Patient Have a Medical Advance Directive? Yes  Type of Advance Directive Out of facility DNR (pink MOST or yellow form)  Does patient want to make changes to medical advance directive? No - Patient declined     Chief Complaint  Patient presents with   Medical Management of Chronic Issues    Medical Management of Chronic Issues.     HPI:  Pt is a 89 y.o. female seen today for medical management of chronic diseases.    She currently resides on the skilled nursing unit at [facility]. PMH: HTN, HLD, CVA, GERD, meningioma, osteoporosis, insomnia, back pain, depression and anxiety.   Vertigo- onset 01/15, improved with meclizine   URI- onset 01/05, covid/flu negative, completed doxycycline  and prednisone  taper, continues to have dry cough which sometimes keeps her up at night > completed tessalon perles> requesting to restart HTN- BUN/creat 22/0.9 09/25/2024, remains on hydrochlorothiazide  and propranolol HLD- LDL 86 12/2023, remains on atorvastatin  H/o CVA- left frontal lobe infarct 01/2021, remains on Plavix  and atorvastatin  Depression- Na+ 141 09/25/2024, remains on Cymbalta  Chronic back pain - see above, remains on tylenol  and baclofen prn Osteoporosis- DEXA 05/2023, remains on Prolia  and calcium /vitamin D supplement  Insomnia- remains on melatonin and Trazodone    Very pleasant. No concerns besides cough. No recent falls or injuries. Recent BIMS score 13/15  07/2024.   Recent blood pressures:  01/14- 132/78  01/09- 136/72  12/31- 168/77  Recent weights:  01/07- 193.2 lbs  12/05- 191.4 lbs  11/05- 189.4 lbs    Past Medical History:  Diagnosis Date   Anxiety    Breast cancer (HCC)    1990   Complication of anesthesia    Depression    Dyspnea    DOE   GERD (gastroesophageal reflux disease)    Headache(784.0)    HOH (hard of hearing)    Hypertension    Lymphoma (HCC)    Pain    CHRONIC BACK   Pain    CHRONIC BACK   Pneumonia    PONV (postoperative nausea and vomiting)    Tremors of nervous system    HANDS OCCAS   Past Surgical History:  Procedure Laterality Date   APPENDECTOMY     BACK SURGERY     BREAST SURGERY     CATARACT EXTRACTION W/PHACO Left 01/26/2017   Procedure: CATARACT EXTRACTION PHACO AND INTRAOCULAR LENS PLACEMENT (IOC);  Surgeon: Elsie Carmine, MD;  Location: [facility] ORS;  Service: Ophthalmology;  Laterality: Left;  US  53.7 AP% 20.3 CDE 10.90 Fluid pack lot # [id number]   CATARACT EXTRACTION W/PHACO Right 02/16/2017   Procedure: CATARACT EXTRACTION PHACO AND INTRAOCULAR LENS PLACEMENT (IOC);  Surgeon: Carmine Elsie, MD;  Location: [facility] ORS;  Service: Ophthalmology;  Laterality: Right;  US  00:55 AP% 22.5 CDE 12.43 Fluid pack lot # [id number]   CESAREAN SECTION     CHOLECYSTECTOMY N/A 03/20/2018   Procedure: LAPAROSCOPIC CHOLECYSTECTOMY;  Surgeon: Rodolph Romano, MD;  Location: [facility] ORS;  Service: General;  Laterality: N/A;   MASTECTOMY Right 1992   TUBAL LIGATION      Allergies[1]  Outpatient Encounter Medications as of 10/30/2024  Medication Sig   acetaminophen  (TYLENOL ) 325 MG tablet Take 2 tablets (650 mg total) by mouth every 6 (six) hours as needed for mild pain (pain score 1-3), fever or headache.   acetaminophen  (TYLENOL ) 500 MG tablet Take 1,000 mg by mouth at bedtime.   atorvastatin  (LIPITOR) 40 MG tablet Take 1 tablet (40 mg total) by mouth daily.   Baclofen 5 MG TABS Take 5 mg by  mouth every 8 (eight) hours as needed.   benzonatate (TESSALON) 200 MG capsule Take 200 mg by mouth 2 (two) times daily.   calcium  carbonate (OSCAL) 1500 (600 Ca) MG TABS tablet Take 600 mg by mouth 2 (two) times daily with a meal.   cholecalciferol (VITAMIN D) 1000 units tablet Take 2,000 Units by mouth daily.   clopidogrel  (PLAVIX ) 75 MG tablet Take 1 tablet (75 mg total) by mouth daily.   denosumab  (PROLIA ) 60 MG/ML SOSY injection Inject 60 mg into the skin every 6 (six) months.   DULoxetine (CYMBALTA) 30 MG capsule Take 30 mg by mouth daily. Give 30 mg by mouth one time a day for depression   fluticasone  (FLONASE ) 50 MCG/ACT nasal spray Place 2 sprays into both nostrils daily.   hydrochlorothiazide  (HYDRODIURIL ) 25 MG tablet Take 25 mg by mouth daily.   hydroxypropyl methylcellulose / hypromellose (ISOPTO TEARS / GONIOVISC) 2.5 % ophthalmic solution Place 1 drop into both eyes every 6 (six) hours as needed for dry eyes.   KLOR-CON  M20 20 MEQ tablet Take 20 mEq by mouth in the morning and at bedtime.   meclizine  (ANTIVERT ) 25 MG tablet Take 25 mg by mouth QID. 1 tablet by mouth four times a day for vertigo, insomnia   melatonin 5 MG TABS Take by mouth at bedtime. Take Two Tablets by mouth every 24 hours as needed.   polyethylene glycol (MIRALAX  / GLYCOLAX ) 17 g packet Take 17 g by mouth as directed. Give 0.5 scoop by mouth one time a day every 2 day(s) for bowel regimen   propranolol (INDERAL) 20 MG tablet Take 20 mg by mouth 2 (two) times daily.   traZODone  (DESYREL ) 50 MG tablet Take 25 mg by mouth at bedtime.   vitamin B-12 (CYANOCOBALAMIN ) 500 MCG tablet Take 500 mcg by mouth daily.   benzonatate (TESSALON) 200 MG capsule Take 200 mg by mouth 3 (three) times daily. Benzonatate Oral Capsule 200 MG (Benzonatate) Give 1 capsule by mouth three times a day for cough for 5 Days (Patient not taking: Reported on 10/30/2024)   Guaifenesin 200 MG/5ML LIQD Take 10 mLs by mouth every 6 (six) hours  as needed. (Patient not taking: Reported on 10/30/2024)   ipratropium-albuterol (DUONEB) 0.5-2.5 (3) MG/3ML SOLN Inhale 3 mLs into the lungs as directed. 1 vial inhale orally two times a day for cough for 5 Days (Patient not taking: Reported on 10/30/2024)   No facility-administered encounter medications on file as of 10/30/2024.    Review of Systems  Constitutional:  Negative for fatigue and fever.  HENT:  Negative for congestion, sore throat and trouble swallowing.   Eyes:  Negative for visual disturbance.  Respiratory:  Positive for cough. Negative for shortness of breath and wheezing.   Cardiovascular:  Negative for chest pain and leg swelling.  Gastrointestinal:  Negative for abdominal distention and abdominal pain.  Genitourinary:  Negative for dysuria and hematuria.  Musculoskeletal:  Positive for gait problem.  Skin:  Negative for wound.  Neurological:  Positive for dizziness and weakness. Negative for headaches.  Psychiatric/Behavioral:  Positive for dysphoric mood and sleep disturbance. Negative for confusion. The patient is not nervous/anxious.     Immunization History  Administered Date(s) Administered    sv, Bivalent, Protein Subunit Rsvpref,pf [place]) 08/02/2023   INFLUENZA, HIGH DOSE SEASONAL PF 07/14/2017, 07/26/2023   Influenza-Unspecified 07/25/2024   PFIZER Comirnaty(Gray Top)Covid-19 Tri-Sucrose Vaccine 10/19/2019, 11/09/2019, 10/17/2020, 04/16/2021   PFIZER(Purple Top)SARS-COV-2 Vaccination 10/19/2019, 11/09/2019   Pneumococcal-Unspecified 04/10/2000   Tdap 03/29/2014, 07/09/2015   Unspecified SARS-COV-2 Vaccination 08/04/2024   Zoster, Unspecified 04/16/2021   Pertinent  Health Maintenance Due  Topic Date Due   Influenza Vaccine  Completed   Bone Density Scan  Completed      01/18/2021   12:00 PM 01/18/2021    8:30 PM 01/19/2021    9:00 AM 05/23/2024    3:18 PM 10/17/2024    9:41 AM  Fall Risk  Falls in the past year?    1 1  Was there an injury with Fall?     1  0  Fall Risk Category Calculator    2 1  (RETIRED) Patient Fall Risk Level High fall risk  High fall risk  High fall risk     Patient at Risk for Falls Due to    History of fall(s);Impaired balance/gait;Impaired mobility History of fall(s);Impaired balance/gait  Fall risk Follow up    Falls evaluation completed Falls evaluation completed     Data saved with a previous flowsheet row definition   Functional Status Survey:    Vitals:   10/30/24 1217  BP: 132/78  Pulse: 87  Resp: 17  Temp: 98 F (36.7 C)  SpO2: 97%  Weight: 193 lb 3.2 oz (87.6 kg)  Height: 5' 8 (1.727 m)   Body mass index is 29.38 kg/m. Physical Exam Vitals reviewed.  Constitutional:      General: She is not in acute distress. HENT:     Head: Normocephalic.     Right Ear: There is no impacted cerumen.     Left Ear: There is no impacted cerumen.     Nose: Nose normal.     Mouth/Throat:     Mouth: Mucous membranes are moist.     Pharynx: No posterior oropharyngeal erythema.  Eyes:     General:        Right eye: No discharge.        Left eye: No discharge.  Cardiovascular:     Rate and Rhythm: Normal rate and regular rhythm.     Pulses: Normal pulses.     Heart sounds: Normal heart sounds.  Pulmonary:     Effort: Pulmonary effort is normal. No respiratory distress.     Breath sounds: Normal breath sounds. No wheezing, rhonchi or rales.  Abdominal:     General: Bowel sounds are normal. There is no distension.     Palpations: Abdomen is soft.     Tenderness: There is no abdominal tenderness.  Musculoskeletal:     Cervical back: Neck supple.     Right lower leg: Edema present.     Left lower leg: Edema present.     Comments: Non pitting  Lymphadenopathy:     Cervical: No cervical adenopathy.  Skin:    General: Skin is warm.     Capillary Refill: Capillary refill takes less than 2 seconds.  Neurological:  General: No focal deficit present.     Mental Status: She is alert and oriented to  person, place, and time.     Gait: Gait abnormal.  Psychiatric:        Mood and Affect: Mood normal.     Labs reviewed: Recent Labs    01/22/24 0540 01/23/24 0421 01/24/24 0210 01/26/24 0442 02/17/24 0000 04/02/24 1424 09/25/24 0000  NA 136 137 135   < > 137 138 141  K 2.8* 4.3 4.0   < > 4.2 4.3 4.2  CL 96* 98 95*   < > 97* 103 104  CO2 32 31 32   < > 28* 27 32*  GLUCOSE 130* 116* 114*  --   --  112*  --   BUN 21 21 21    < > 15 25* 22*  CREATININE 0.73 0.71 0.66   < > 0.6 0.68 0.9  CALCIUM  7.7* 8.0* 8.1*   < > 8.6* 8.8* 8.7  MG 2.3  --   --   --   --   --   --    < > = values in this interval not displayed.   Recent Labs    01/06/24 0436 01/19/24 0948 01/31/24 0000 04/02/24 1424 09/25/24 0000  AST 16 19 12* 22 12*  ALT 14 13 18 17 13   ALKPHOS 47 131* 183* 69 57  BILITOT 1.0 1.0  --  1.0  --   PROT 5.7* 6.6  --  6.4*  --   ALBUMIN 3.1* 3.5 3.5 3.5 3.4*   Recent Labs    01/23/24 0421 01/24/24 0210 01/24/24 0210 01/31/24 0000 02/17/24 0000 03/13/24 0000 04/02/24 1536 09/25/24 0000  WBC 5.3 4.8   < > 11.8 8.2 3.9 6.8 3.5  NEUTROABS 3.2 3.1  --  10,231.00 6,642.00  --   --   --   HGB 12.5 12.3  --  12.8 13.1 11.8* 13.7 12.1  HCT 38.6 37.9  --  39 41 37 43.2 38  MCV 96.3 95.0  --   --   --   --  95.2  --   PLT 223 223  --  202 195 162 169 143*   < > = values in this interval not displayed.   Lab Results  Component Value Date   TSH 1.60 02/17/2024   Lab Results  Component Value Date   HGBA1C 5.3 01/05/2024   Lab Results  Component Value Date   CHOL 146 01/06/2024   HDL 42 01/06/2024   LDLCALC 86 01/06/2024   TRIG 89 01/06/2024   CHOLHDL 3.5 01/06/2024    Significant Diagnostic Results in last 30 days:  No results found.  Assessment/Plan 1. Vertigo (Primary) - onset 01/15 - improved with meclizine    2. Bacterial upper respiratory infection - onset 01/05 - completed doxycycline  and prednisone  - lung sounds clear but c/o dry cough -  suspect rebound bronchitis> restart tessalon x 7 days  3. Essential (primary) hypertension - controlled with HCTZ  4. Mixed hyperlipidemia - LDL stable with atorvastatin   5. History of stroke - left frontal infarct 01/2021 - cont plavix  and atorvastatin   6. Depression, recurrent - no changes in mood - Na+ stable - cont Cymbalta   7. Chronic bilateral low back pain without sciatica - cont tylenol , baclofen and Cymbalta  8. Senile osteoporosis - DEXA 2024 - cont Prolia , calcium  and vitamin D  9. Primary insomnia - stable with melatonin and and trazodone      Family/ staff  Communication: plan discussed with patient and nurse  Labs/tests ordered:  none         [1]  Allergies Allergen Reactions   Amoxicillin Swelling    Blisters.    Aspirin Other (See Comments)    Stomach ulcers.   Benadryl [Diphenhydramine Hcl] Other (See Comments)    Hyperactive.   Fentanyl     Penicillins    "
